# Patient Record
Sex: Female | Born: 1940 | Race: White | Hispanic: No | Smoking: Never smoker
Health system: Southern US, Community
[De-identification: ages and names within clinical notes are randomized; demographics above are authoritative.]

## PROBLEM LIST (undated history)

## (undated) DIAGNOSIS — N132 Hydronephrosis with renal and ureteral calculous obstruction: Secondary | ICD-10-CM

## (undated) DIAGNOSIS — F32A Depression, unspecified: Secondary | ICD-10-CM

## (undated) DIAGNOSIS — E785 Hyperlipidemia, unspecified: Secondary | ICD-10-CM

## (undated) DIAGNOSIS — I4891 Unspecified atrial fibrillation: Secondary | ICD-10-CM

## (undated) DIAGNOSIS — IMO0001 Reserved for inherently not codable concepts without codable children: Secondary | ICD-10-CM

## (undated) DIAGNOSIS — I639 Cerebral infarction, unspecified: Secondary | ICD-10-CM

## (undated) DIAGNOSIS — N3941 Urge incontinence: Secondary | ICD-10-CM

## (undated) DIAGNOSIS — I10 Essential (primary) hypertension: Secondary | ICD-10-CM

## (undated) DIAGNOSIS — R159 Full incontinence of feces: Secondary | ICD-10-CM

## (undated) DIAGNOSIS — I4892 Unspecified atrial flutter: Secondary | ICD-10-CM

## (undated) DIAGNOSIS — N200 Calculus of kidney: Secondary | ICD-10-CM

## (undated) DIAGNOSIS — N189 Chronic kidney disease, unspecified: Secondary | ICD-10-CM

## (undated) DIAGNOSIS — F329 Major depressive disorder, single episode, unspecified: Secondary | ICD-10-CM

## (undated) DIAGNOSIS — N12 Tubulo-interstitial nephritis, not specified as acute or chronic: Secondary | ICD-10-CM

## (undated) DIAGNOSIS — R011 Cardiac murmur, unspecified: Secondary | ICD-10-CM

## (undated) HISTORY — DX: Hyperlipidemia, unspecified: E78.5

## (undated) HISTORY — PX: KNEE SURGERY: SHX244

## (undated) HISTORY — DX: Reserved for inherently not codable concepts without codable children: IMO0001

## (undated) HISTORY — PX: ABDOMINAL HYSTERECTOMY: SHX81

---

## 1992-05-12 HISTORY — PX: BLADDER SURGERY: SHX569

## 1997-10-11 ENCOUNTER — Emergency Department (HOSPITAL_COMMUNITY): Admission: EM | Admit: 1997-10-11 | Discharge: 1997-10-11 | Payer: Self-pay | Admitting: Emergency Medicine

## 1997-10-25 ENCOUNTER — Encounter: Admission: RE | Admit: 1997-10-25 | Discharge: 1998-01-23 | Payer: Self-pay | Admitting: Specialist

## 1998-04-15 ENCOUNTER — Emergency Department (HOSPITAL_COMMUNITY): Admission: EM | Admit: 1998-04-15 | Discharge: 1998-04-15 | Payer: Self-pay | Admitting: Internal Medicine

## 1999-11-05 ENCOUNTER — Emergency Department (HOSPITAL_COMMUNITY): Admission: EM | Admit: 1999-11-05 | Discharge: 1999-11-05 | Payer: Self-pay | Admitting: Emergency Medicine

## 2000-10-24 ENCOUNTER — Emergency Department (HOSPITAL_COMMUNITY): Admission: EM | Admit: 2000-10-24 | Discharge: 2000-10-24 | Payer: Self-pay

## 2001-08-10 ENCOUNTER — Ambulatory Visit (HOSPITAL_COMMUNITY): Admission: RE | Admit: 2001-08-10 | Discharge: 2001-08-10 | Payer: Self-pay | Admitting: Family Medicine

## 2002-10-16 ENCOUNTER — Emergency Department (HOSPITAL_COMMUNITY): Admission: EM | Admit: 2002-10-16 | Discharge: 2002-10-16 | Payer: Self-pay | Admitting: Emergency Medicine

## 2003-07-14 ENCOUNTER — Emergency Department (HOSPITAL_COMMUNITY): Admission: EM | Admit: 2003-07-14 | Discharge: 2003-07-15 | Payer: Self-pay | Admitting: *Deleted

## 2003-09-20 ENCOUNTER — Emergency Department (HOSPITAL_COMMUNITY): Admission: EM | Admit: 2003-09-20 | Discharge: 2003-09-20 | Payer: Self-pay | Admitting: Emergency Medicine

## 2004-01-15 ENCOUNTER — Emergency Department (HOSPITAL_COMMUNITY): Admission: EM | Admit: 2004-01-15 | Discharge: 2004-01-15 | Payer: Self-pay | Admitting: *Deleted

## 2004-08-08 ENCOUNTER — Emergency Department (HOSPITAL_COMMUNITY): Admission: EM | Admit: 2004-08-08 | Discharge: 2004-08-09 | Payer: Self-pay | Admitting: Emergency Medicine

## 2005-05-12 DIAGNOSIS — IMO0001 Reserved for inherently not codable concepts without codable children: Secondary | ICD-10-CM

## 2005-05-12 HISTORY — DX: Reserved for inherently not codable concepts without codable children: IMO0001

## 2005-06-23 ENCOUNTER — Emergency Department (HOSPITAL_COMMUNITY): Admission: EM | Admit: 2005-06-23 | Discharge: 2005-06-23 | Payer: Self-pay | Admitting: Family Medicine

## 2005-12-21 ENCOUNTER — Emergency Department (HOSPITAL_COMMUNITY): Admission: EM | Admit: 2005-12-21 | Discharge: 2005-12-21 | Payer: Self-pay | Admitting: Family Medicine

## 2006-01-08 ENCOUNTER — Emergency Department (HOSPITAL_COMMUNITY): Admission: EM | Admit: 2006-01-08 | Discharge: 2006-01-08 | Payer: Self-pay | Admitting: Emergency Medicine

## 2006-05-03 ENCOUNTER — Emergency Department (HOSPITAL_COMMUNITY): Admission: EM | Admit: 2006-05-03 | Discharge: 2006-05-03 | Payer: Self-pay | Admitting: Emergency Medicine

## 2006-05-12 ENCOUNTER — Emergency Department (HOSPITAL_COMMUNITY): Admission: EM | Admit: 2006-05-12 | Discharge: 2006-05-12 | Payer: Self-pay | Admitting: Emergency Medicine

## 2006-08-22 ENCOUNTER — Emergency Department (HOSPITAL_COMMUNITY): Admission: EM | Admit: 2006-08-22 | Discharge: 2006-08-22 | Payer: Self-pay | Admitting: Family Medicine

## 2006-12-01 ENCOUNTER — Emergency Department (HOSPITAL_COMMUNITY): Admission: EM | Admit: 2006-12-01 | Discharge: 2006-12-01 | Payer: Self-pay | Admitting: Emergency Medicine

## 2007-02-11 ENCOUNTER — Ambulatory Visit: Payer: Self-pay | Admitting: Family Medicine

## 2007-02-11 DIAGNOSIS — E782 Mixed hyperlipidemia: Secondary | ICD-10-CM

## 2007-02-11 DIAGNOSIS — D6489 Other specified anemias: Secondary | ICD-10-CM | POA: Insufficient documentation

## 2007-02-11 DIAGNOSIS — I10 Essential (primary) hypertension: Secondary | ICD-10-CM | POA: Insufficient documentation

## 2007-02-11 DIAGNOSIS — M1990 Unspecified osteoarthritis, unspecified site: Secondary | ICD-10-CM | POA: Insufficient documentation

## 2007-02-11 DIAGNOSIS — E118 Type 2 diabetes mellitus with unspecified complications: Secondary | ICD-10-CM

## 2007-02-11 DIAGNOSIS — K219 Gastro-esophageal reflux disease without esophagitis: Secondary | ICD-10-CM | POA: Insufficient documentation

## 2007-02-11 DIAGNOSIS — E669 Obesity, unspecified: Secondary | ICD-10-CM

## 2007-02-11 LAB — CONVERTED CEMR LAB
AST: 10 units/L (ref 0–37)
Albumin: 4.1 g/dL (ref 3.5–5.2)
Alkaline Phosphatase: 73 units/L (ref 39–117)
BUN: 39 mg/dL — ABNORMAL HIGH (ref 6–23)
CO2: 21 meq/L (ref 19–32)
Creatinine, Ser: 1.44 mg/dL — ABNORMAL HIGH (ref 0.40–1.20)
HCT: 32.3 % — ABNORMAL LOW (ref 36.0–46.0)
Hgb A1c MFr Bld: 7.1 %
MCV: 85.7 fL (ref 78.0–100.0)
Platelets: 181 10*3/uL (ref 150–400)
Potassium: 4.5 meq/L (ref 3.5–5.3)
RBC: 3.77 M/uL — ABNORMAL LOW (ref 3.87–5.11)
Sodium: 142 meq/L (ref 135–145)
WBC: 6.3 10*3/uL (ref 4.0–10.5)

## 2007-02-12 ENCOUNTER — Encounter: Payer: Self-pay | Admitting: Family Medicine

## 2007-03-02 ENCOUNTER — Encounter: Payer: Self-pay | Admitting: Family Medicine

## 2007-03-12 ENCOUNTER — Encounter: Payer: Self-pay | Admitting: Family Medicine

## 2007-03-29 ENCOUNTER — Telehealth: Payer: Self-pay | Admitting: *Deleted

## 2007-05-17 ENCOUNTER — Emergency Department (HOSPITAL_COMMUNITY): Admission: EM | Admit: 2007-05-17 | Discharge: 2007-05-17 | Payer: Self-pay | Admitting: Family Medicine

## 2007-06-06 ENCOUNTER — Emergency Department (HOSPITAL_COMMUNITY): Admission: EM | Admit: 2007-06-06 | Discharge: 2007-06-06 | Payer: Self-pay | Admitting: Family Medicine

## 2007-06-17 ENCOUNTER — Ambulatory Visit: Payer: Self-pay | Admitting: Family Medicine

## 2007-06-17 LAB — CONVERTED CEMR LAB
HCT: 32.8 % — ABNORMAL LOW (ref 36.0–46.0)
MCV: 86.3 fL (ref 78.0–100.0)
RBC: 3.8 M/uL — ABNORMAL LOW (ref 3.87–5.11)
RDW: 13.8 % (ref 11.5–15.5)

## 2007-08-31 ENCOUNTER — Telehealth: Payer: Self-pay | Admitting: *Deleted

## 2007-09-01 ENCOUNTER — Ambulatory Visit: Payer: Self-pay | Admitting: Family Medicine

## 2007-09-01 ENCOUNTER — Encounter (INDEPENDENT_AMBULATORY_CARE_PROVIDER_SITE_OTHER): Payer: Self-pay | Admitting: Family Medicine

## 2007-09-01 LAB — CONVERTED CEMR LAB
Bilirubin Urine: NEGATIVE
Glucose, Urine, Semiquant: NEGATIVE
Protein, U semiquant: NEGATIVE
pH: 5.5

## 2007-11-09 ENCOUNTER — Telehealth: Payer: Self-pay | Admitting: *Deleted

## 2007-11-10 ENCOUNTER — Ambulatory Visit: Payer: Self-pay | Admitting: Family Medicine

## 2007-11-10 ENCOUNTER — Encounter: Payer: Self-pay | Admitting: Family Medicine

## 2007-11-10 LAB — CONVERTED CEMR LAB
Bacteria, UA: 3
Nitrite: POSITIVE
Protein, U semiquant: NEGATIVE
Specific Gravity, Urine: 1.015
Urobilinogen, UA: 0.2
pH: 5

## 2007-12-16 ENCOUNTER — Ambulatory Visit: Payer: Self-pay | Admitting: Family Medicine

## 2007-12-16 LAB — CONVERTED CEMR LAB
BUN: 43 mg/dL — ABNORMAL HIGH (ref 6–23)
CO2: 21 meq/L (ref 19–32)
Calcium: 8.8 mg/dL (ref 8.4–10.5)
Cholesterol: 145 mg/dL (ref 0–200)
HCT: 32.1 % — ABNORMAL LOW (ref 36.0–46.0)
Hemoglobin: 10.4 g/dL — ABNORMAL LOW (ref 12.0–15.0)
Hgb A1c MFr Bld: 7 %
LDL Cholesterol: 83 mg/dL (ref 0–99)
MCHC: 32.4 g/dL (ref 30.0–36.0)
Platelets: 205 10*3/uL (ref 150–400)
Potassium: 4.2 meq/L (ref 3.5–5.3)
RBC: 3.77 M/uL — ABNORMAL LOW (ref 3.87–5.11)
RDW: 14.4 % (ref 11.5–15.5)
Triglycerides: 157 mg/dL — ABNORMAL HIGH (ref <150)
WBC: 7.4 10*3/uL (ref 4.0–10.5)

## 2008-02-25 ENCOUNTER — Ambulatory Visit: Payer: Self-pay | Admitting: Family Medicine

## 2008-03-29 ENCOUNTER — Emergency Department (HOSPITAL_COMMUNITY): Admission: EM | Admit: 2008-03-29 | Discharge: 2008-03-29 | Payer: Self-pay | Admitting: Emergency Medicine

## 2008-06-07 ENCOUNTER — Ambulatory Visit: Payer: Self-pay | Admitting: Family Medicine

## 2008-06-07 LAB — CONVERTED CEMR LAB
Bilirubin Urine: NEGATIVE
Glucose, Urine, Semiquant: NEGATIVE
Ketones, urine, test strip: NEGATIVE
Specific Gravity, Urine: 1.02
WBC, UA: >20 cells/hpf
pH: 5.5

## 2008-06-08 ENCOUNTER — Encounter: Payer: Self-pay | Admitting: Family Medicine

## 2008-07-10 ENCOUNTER — Ambulatory Visit: Payer: Self-pay | Admitting: Family Medicine

## 2008-07-10 LAB — CONVERTED CEMR LAB
ALT: 9 units/L (ref 0–35)
BUN: 44 mg/dL — ABNORMAL HIGH (ref 6–23)
CO2: 18 meq/L — ABNORMAL LOW (ref 19–32)
Calcium: 8.7 mg/dL (ref 8.4–10.5)
Chloride: 111 meq/L (ref 96–112)
Creatinine, Ser: 1.75 mg/dL — ABNORMAL HIGH (ref 0.40–1.20)
Glucose, Bld: 65 mg/dL — ABNORMAL LOW (ref 70–99)
Hgb A1c MFr Bld: 6.4 %

## 2008-07-24 ENCOUNTER — Encounter: Payer: Self-pay | Admitting: *Deleted

## 2008-08-21 ENCOUNTER — Telehealth: Payer: Self-pay | Admitting: Family Medicine

## 2008-09-26 ENCOUNTER — Encounter: Payer: Self-pay | Admitting: Family Medicine

## 2008-11-17 ENCOUNTER — Encounter: Payer: Self-pay | Admitting: Family Medicine

## 2008-11-17 ENCOUNTER — Ambulatory Visit: Payer: Self-pay | Admitting: Family Medicine

## 2008-11-17 LAB — CONVERTED CEMR LAB
Bilirubin Urine: NEGATIVE
Urobilinogen, UA: 0.2

## 2008-12-04 ENCOUNTER — Ambulatory Visit: Payer: Self-pay | Admitting: Family Medicine

## 2008-12-13 ENCOUNTER — Telehealth: Payer: Self-pay | Admitting: *Deleted

## 2008-12-13 ENCOUNTER — Encounter: Payer: Self-pay | Admitting: Family Medicine

## 2008-12-13 ENCOUNTER — Ambulatory Visit: Payer: Self-pay | Admitting: Family Medicine

## 2008-12-13 LAB — CONVERTED CEMR LAB
Bilirubin Urine: NEGATIVE
Ketones, urine, test strip: NEGATIVE
Protein, U semiquant: 30
RBC / HPF: >20
Urobilinogen, UA: 1

## 2008-12-14 ENCOUNTER — Encounter: Payer: Self-pay | Admitting: Family Medicine

## 2009-02-26 ENCOUNTER — Telehealth: Payer: Self-pay | Admitting: Family Medicine

## 2009-02-26 ENCOUNTER — Ambulatory Visit: Payer: Self-pay | Admitting: Family Medicine

## 2009-02-26 DIAGNOSIS — G4700 Insomnia, unspecified: Secondary | ICD-10-CM

## 2009-02-26 LAB — CONVERTED CEMR LAB
Nitrite: NEGATIVE
Specific Gravity, Urine: 1.015

## 2009-04-02 ENCOUNTER — Ambulatory Visit: Payer: Self-pay | Admitting: Family Medicine

## 2009-04-02 LAB — CONVERTED CEMR LAB
Bilirubin Urine: NEGATIVE
Blood in Urine, dipstick: NEGATIVE
Glucose, Urine, Semiquant: NEGATIVE
Protein, U semiquant: NEGATIVE
Specific Gravity, Urine: 1.02
pH: 5.5

## 2009-04-03 ENCOUNTER — Encounter: Payer: Self-pay | Admitting: Family Medicine

## 2009-04-10 ENCOUNTER — Observation Stay (HOSPITAL_COMMUNITY): Admission: EM | Admit: 2009-04-10 | Discharge: 2009-04-11 | Payer: Self-pay | Admitting: Emergency Medicine

## 2009-04-19 ENCOUNTER — Ambulatory Visit: Payer: Self-pay | Admitting: Family Medicine

## 2009-04-19 ENCOUNTER — Encounter: Payer: Self-pay | Admitting: Family Medicine

## 2009-04-19 LAB — CONVERTED CEMR LAB
Hemoglobin: 10.8 g/dL — ABNORMAL LOW (ref 12.0–15.0)
MCHC: 32.3 g/dL (ref 30.0–36.0)
MCV: 84.8 fL (ref 78.0–100.0)
RDW: 14 % (ref 11.5–15.5)

## 2009-04-23 ENCOUNTER — Ambulatory Visit: Payer: Self-pay | Admitting: Family Medicine

## 2009-05-09 ENCOUNTER — Ambulatory Visit: Payer: Self-pay | Admitting: Family Medicine

## 2009-07-30 ENCOUNTER — Ambulatory Visit: Payer: Self-pay | Admitting: Family Medicine

## 2009-07-30 DIAGNOSIS — C4490 Unspecified malignant neoplasm of skin, unspecified: Secondary | ICD-10-CM

## 2009-07-30 LAB — CONVERTED CEMR LAB
Blood in Urine, dipstick: NEGATIVE
Hgb A1c MFr Bld: 8.1 %
Nitrite: NEGATIVE
Specific Gravity, Urine: 1.015
Urobilinogen, UA: 1

## 2009-08-15 ENCOUNTER — Encounter: Admission: RE | Admit: 2009-08-15 | Discharge: 2009-10-03 | Payer: Self-pay | Admitting: Orthopaedic Surgery

## 2009-08-27 ENCOUNTER — Encounter: Payer: Self-pay | Admitting: Family Medicine

## 2009-09-07 ENCOUNTER — Telehealth: Payer: Self-pay | Admitting: Family Medicine

## 2009-09-07 ENCOUNTER — Ambulatory Visit: Payer: Self-pay | Admitting: Family Medicine

## 2009-09-17 ENCOUNTER — Ambulatory Visit: Payer: Self-pay | Admitting: Family Medicine

## 2009-09-17 LAB — CONVERTED CEMR LAB
Bilirubin Urine: NEGATIVE
Ketones, urine, test strip: NEGATIVE
Nitrite: NEGATIVE
Protein, U semiquant: NEGATIVE
Urobilinogen, UA: 0.2

## 2009-10-10 ENCOUNTER — Encounter: Payer: Self-pay | Admitting: Family Medicine

## 2009-12-26 ENCOUNTER — Telehealth: Payer: Self-pay | Admitting: Family Medicine

## 2010-01-07 ENCOUNTER — Ambulatory Visit: Payer: Self-pay | Admitting: Family Medicine

## 2010-01-07 LAB — CONVERTED CEMR LAB: Hgb A1c MFr Bld: 7.5 %

## 2010-01-10 ENCOUNTER — Encounter: Payer: Self-pay | Admitting: Family Medicine

## 2010-01-10 ENCOUNTER — Ambulatory Visit: Payer: Self-pay | Admitting: Family Medicine

## 2010-01-11 ENCOUNTER — Telehealth: Payer: Self-pay | Admitting: *Deleted

## 2010-01-11 DIAGNOSIS — N183 Chronic kidney disease, stage 3 unspecified: Secondary | ICD-10-CM | POA: Insufficient documentation

## 2010-01-17 ENCOUNTER — Encounter: Payer: Self-pay | Admitting: Family Medicine

## 2010-01-17 ENCOUNTER — Emergency Department (HOSPITAL_COMMUNITY): Admission: EM | Admit: 2010-01-17 | Discharge: 2010-01-17 | Payer: Self-pay | Admitting: Emergency Medicine

## 2010-01-17 ENCOUNTER — Ambulatory Visit (HOSPITAL_COMMUNITY): Admission: RE | Admit: 2010-01-17 | Discharge: 2010-01-17 | Payer: Self-pay | Admitting: Family Medicine

## 2010-01-18 LAB — CONVERTED CEMR LAB
ALT: 8 units/L (ref 0–35)
Albumin: 4 g/dL (ref 3.5–5.2)
CO2: 22 meq/L (ref 19–32)
Calcium: 8.1 mg/dL — ABNORMAL LOW (ref 8.4–10.5)
Chloride: 108 meq/L (ref 96–112)
Cholesterol: 149 mg/dL (ref 0–200)
HCT: 32.3 % — ABNORMAL LOW (ref 36.0–46.0)
Platelets: 174 10*3/uL (ref 150–400)
Potassium: 4.5 meq/L (ref 3.5–5.3)
Sodium: 143 meq/L (ref 135–145)
Total Protein: 6.3 g/dL (ref 6.0–8.3)
WBC: 6.8 10*3/uL (ref 4.0–10.5)

## 2010-02-18 ENCOUNTER — Encounter: Payer: Self-pay | Admitting: Family Medicine

## 2010-03-06 ENCOUNTER — Ambulatory Visit: Payer: Self-pay | Admitting: Family Medicine

## 2010-03-06 DIAGNOSIS — E1149 Type 2 diabetes mellitus with other diabetic neurological complication: Secondary | ICD-10-CM | POA: Insufficient documentation

## 2010-03-06 LAB — CONVERTED CEMR LAB
Bilirubin Urine: NEGATIVE
Epithelial cells, urine: <5 /lpf
Ketones, urine, test strip: NEGATIVE
Protein, U semiquant: NEGATIVE
Urobilinogen, UA: 0.2
pH: 5.5

## 2010-03-13 ENCOUNTER — Encounter: Payer: Self-pay | Admitting: Family Medicine

## 2010-03-21 ENCOUNTER — Ambulatory Visit: Payer: Self-pay | Admitting: Family Medicine

## 2010-03-21 LAB — CONVERTED CEMR LAB
Blood in Urine, dipstick: NEGATIVE
Glucose, Urine, Semiquant: NEGATIVE
Ketones, urine, test strip: NEGATIVE
Protein, U semiquant: NEGATIVE
pH: 5.5

## 2010-03-22 ENCOUNTER — Encounter: Payer: Self-pay | Admitting: Family Medicine

## 2010-05-17 ENCOUNTER — Encounter: Payer: Self-pay | Admitting: *Deleted

## 2010-06-03 ENCOUNTER — Ambulatory Visit
Admission: RE | Admit: 2010-06-03 | Discharge: 2010-06-03 | Payer: Self-pay | Source: Home / Self Care | Attending: Family Medicine | Admitting: Family Medicine

## 2010-06-13 NOTE — Miscellaneous (Signed)
Summary: Lyrica refussal  Clinical Lists Changes  Needs to have tried gabapentin first.  Called her NA left message Pearlean Brownie MD  March 13, 2010 10:23 AM   Medications: Changed medication from LYRICA 50 MG CAPS (PREGABALIN) 1 tablet three times a day to GABAPENTIN 100 MG CAPS (GABAPENTIN) 1 by mouth once daily build up till are taking 1 three times a day for pain - Signed Rx of GABAPENTIN 100 MG CAPS (GABAPENTIN) 1 by mouth once daily build up till are taking 1 three times a day for pain;  #90 x 1;  Signed;  Entered by: Pearlean Brownie MD;  Authorized by: Pearlean Brownie MD;  Method used: Electronically to [facility]*, [address], [place], [place]  [address], Ph: [phone], Fax: [phone]    Prescriptions: GABAPENTIN 100 MG CAPS (GABAPENTIN) 1 by mouth once daily build up till are taking 1 three times a day for pain  #90 x 1   Entered and Authorized by:   Pearlean Brownie MD   Signed by:   Pearlean Brownie MD on 03/13/2010   Method used:   Electronically to        [facility]* (retail)       [address]       [place], [place]  [address]       Ph: [phone]       Fax: [phone]   RxID:   [id number]

## 2010-06-13 NOTE — Progress Notes (Signed)
Summary: refill  Phone Note Refill Request Call back at Home Phone [phone] Message from:  Patient  Refills Requested: Medication #1:  CLONIDINE HCL 0.1 MG TABS one two times a day for one week then stop  Medication #2:  ATENOLOL 25 MG TABS 1 by mouth two times a day  Medication #3:  ENALAPRIL MALEATE 20 MG  TABS 1 two times a day  Medication #4:  HYDROCHLOROTHIAZIDE 50 MG  TABS 1 daily Glimepiride, Metformin, Nexium, Simvastatin pt states that she needs all of her meds to go to [facility] - [phone]  before her appt on 8/29  Initial call taken by: De Nurse,  December 26, 2009 2:47 PM  Follow-up for Phone Call        Can only do one month to [facility] until she is seen. Not sure they will fill that small an amount.  Alternatively can send to local pharmacy Ask which she would like to do thanks  Follow-up by: Pearlean Brownie MD,  December 27, 2009 9:49 AM  Additional Follow-up for Phone Call Additional follow up Details #1::        Filled out form from [facility] for 30 day supply of all medications on her list Additional Follow-up by: Pearlean Brownie MD,  December 28, 2009 4:31 PM    Additional Follow-up for Phone Call Additional follow up Details #2::    Pt informed Follow-up by: Jone Baseman CMA,  December 28, 2009 5:20 PM

## 2010-06-13 NOTE — Progress Notes (Signed)
Summary: triage  Phone Note Call from Patient Call back at Home Phone [phone]   Caller: Patient Complaint: Nausea/Vomiting/Diarrhea Summary of Call: wants to come in today Initial call taken by: De Nurse,  September 07, 2009 10:11 AM  Follow-up for Phone Call        styates this has been going on for 2 days. difficulty keeping ginger ale down. it also triggers more diarrhea. adamant she cannot come this aM. TOLD HER TO TAKE HER CBG & TRY TO DRINK SMALL AMOUNTS OF GINGER ALE at a time. if feeling worse call back.  appt at 3pm work in Follow-up by: Golden Circle RN,  September 07, 2009 10:17 AM

## 2010-06-13 NOTE — Assessment & Plan Note (Signed)
Summary: f/u,df   Vital Signs:  Patient profile:   70 year old female Height:      65 inches Weight:      250.06 pounds BMI:     41.76 BSA:     2.18 Temp:     98.0 degrees F Pulse rate:   82 / minute BP sitting:   138 / 80  Vitals Entered By: Jone Baseman CMA (March 06, 2010 10:25 AM) CC: f/u Is Patient Diabetic? Yes Did you bring your meter with you today? No Pain Assessment Patient in pain? yes     Location: lower stomach Intensity: 5   Primary Care Provider:  Pearlean Brownie MD  CC:  f/u.  History of Present Illness: Dysuria for the last few days.  No fever or back pain or nausea or vomiting.  Is convinced her glimiperide is causing frequent UTIs. Her symptoms all went away after her last antibiotics  Neuropathy Has pain - tingling, burning in lower extremities waxes and wanes.  Would like to try Lyrica.  Tramadol does not help.  Has a callous on her L foot but no open sores.  Wears diabetes shoes.    Renal Failure no edema or shortness of breath.  Relates is taking her diabetes and hypertension medications regularly.  ROS - as above PMH - Medications reviewed and updated in medication list.  Smoking Status noted in VS form      Habits & Providers  Alcohol-Tobacco-Diet     Tobacco Status: never     Tobacco Counseling: not indicated; no tobacco use  Current Medications (verified): 1)  Metformin Hcl 1000 Mg Tabs (Metformin Hcl) .Marland Kitchen.. 1 Two Times A Day 2)  Hydrochlorothiazide 50 Mg  Tabs (Hydrochlorothiazide) .Marland Kitchen.. 1 Daily 3)  Atenolol 25 Mg Tabs (Atenolol) .Marland Kitchen.. 1 By Mouth Two Times A Day 4)  Enalapril Maleate 20 Mg  Tabs (Enalapril Maleate) .Marland Kitchen.. 1 Two Times A Day 5)  Glipizide 10 Mg Tabs (Glipizide) .Marland Kitchen.. 1 Daily For Diabetes 6)  Nexium 40 Mg Cpdr (Esomeprazole Magnesium) .Marland Kitchen.. 1 Daily 7)  Clonidine Hcl 0.1 Mg Tabs (Clonidine Hcl) .... One Two Times A Day 8)  Simvastatin 40 Mg Tabs (Simvastatin) .... Take One Tablet At Bedtime 9)  Ascensia Contour  Test   Strp (Glucose Blood) .... As Directed 1 Box 10)  Ferrous Sulfate 325 (65 Fe) Mg  Tbec (Ferrous Sulfate) .... Hold Until Cbc Back 11)  Ondansetron Hcl 4 Mg Tabs (Ondansetron Hcl) .Marland Kitchen.. 1 Tab By Mouth Q 6 Hours As Needed Nausea or Vomitting 12)  Tramadol Hcl 50 Mg Tabs (Tramadol Hcl) .... Take 1-2 Tabs By Mouth Every 6 Hours As Needed For Pain 13)  Keflex 500 Mg Caps (Cephalexin) .Marland Kitchen.. 1 By Mouth Two Times A Day For 7 Days 14)  Lyrica 50 Mg Caps (Pregabalin) .Marland Kitchen.. 1 Tablet Three Times A Day  Allergies: No Known Drug Allergies  Physical Exam  General:  Well-developed,well-nourished,in no acute distress; alert,appropriate and cooperative throughout examination Msk:  No costrovertebral angle tenderness tenderness Extremities:  No edema  Diabetes Management Exam:    Foot Exam (with socks and/or shoes not present):       Sensory-Pinprick/Light touch:          Left medial foot (L-4): normal          Left dorsal foot (L-5): normal          Left lateral foot (S-1): normal          Right medial foot (L-4):  normal          Right dorsal foot (L-5): normal          Right lateral foot (S-1): normal       Inspection:          Left foot: abnormal             Comments: callus in mid ball of L foot has a ring pad on. No skin breakdown           Right foot: normal   Impression & Recommendations:  Problem # 1:  DIABETIC PERIPHERAL NEUROPATHY (ICD-250.60)  Will start lyrica since tramadol did not help.  Given callus on her foot will write for diabetes shoes  Her updated medication list for this problem includes:    Metformin Hcl 1000 Mg Tabs (Metformin hcl) .Marland Kitchen... 1 two times a day    Enalapril Maleate 20 Mg Tabs (Enalapril maleate) .Marland Kitchen... 1 two times a day    Glipizide 10 Mg Tabs (Glipizide) .Marland Kitchen... 1 daily for diabetes  Orders: [facility]- Est  Level 4 ([address])  Problem # 2:  DYSURIA (ICD-788.1) recurrent UTI.  Will treat and then get a post treatment culture to document cure.  May need suppressive  therapy.  Unfortunately her urine from today was discarded before could be sent for culture   Her updated medication list for this problem includes:    Keflex 500 Mg Caps (Cephalexin) .Marland Kitchen... 1 by mouth two times a day for 7 days  Orders: Urinalysis-[facility] (00000) [facility]- Est  Level 4 ([address])  Problem # 3:  ESSENTIAL HYPERTENSION (ICD-401.9) On recheck was 130/80.  Will continue tight control given her renal disease  Her updated medication list for this problem includes:    Hydrochlorothiazide 50 Mg Tabs (Hydrochlorothiazide) .Marland Kitchen... 1 daily    Atenolol 25 Mg Tabs (Atenolol) .Marland Kitchen... 1 by mouth two times a day    Enalapril Maleate 20 Mg Tabs (Enalapril maleate) .Marland Kitchen... 1 two times a day    Clonidine Hcl 0.1 Mg Tabs (Clonidine hcl) ..... One two times a day  Problem # 4:  CHRONIC KIDNEY DISEASE STAGE III (MODERATE) (ICD-585.3)  Will attempt tight hypertension and diabetes controll and treat and prevent UTIs.  Her weight is worsening this condition and we wil continue to encourage her to change her behaviours   Orders: [facility]- Est  Level 4 ([address])  Complete Medication List: 1)  Metformin Hcl 1000 Mg Tabs (Metformin hcl) .Marland Kitchen.. 1 two times a day 2)  Hydrochlorothiazide 50 Mg Tabs (Hydrochlorothiazide) .Marland Kitchen.. 1 daily 3)  Atenolol 25 Mg Tabs (Atenolol) .Marland Kitchen.. 1 by mouth two times a day 4)  Enalapril Maleate 20 Mg Tabs (Enalapril maleate) .Marland Kitchen.. 1 two times a day 5)  Glipizide 10 Mg Tabs (Glipizide) .Marland Kitchen.. 1 daily for diabetes 6)  Nexium 40 Mg Cpdr (Esomeprazole magnesium) .Marland Kitchen.. 1 daily 7)  Clonidine Hcl 0.1 Mg Tabs (Clonidine hcl) .... One two times a day 8)  Simvastatin 40 Mg Tabs (Simvastatin) .... Take one tablet at bedtime 9)  Ascensia Contour Test Strp (Glucose blood) .... As directed 1 box 10)  Ferrous Sulfate 325 (65 Fe) Mg Tbec (Ferrous sulfate) .... Hold until cbc back 11)  Ondansetron Hcl 4 Mg Tabs (Ondansetron hcl) .Marland Kitchen.. 1 tab by mouth q 6 hours as needed nausea or vomitting 12)  Tramadol Hcl 50 Mg Tabs  (Tramadol hcl) .... Take 1-2 tabs by mouth every 6 hours as needed for pain 13)  Keflex 500 Mg Caps (Cephalexin) .Marland KitchenMarland KitchenMarland Kitchen 1  by mouth two times a day for 7 days 14)  Lyrica 50 Mg Caps (Pregabalin) .Marland Kitchen.. 1 tablet three times a day  Other Orders: Influenza Vaccine MCR ([address])  Patient Instructions: 1)  Come back in December 2)  Start the Lyrica 1 tablet a day after 3-4 days increase to 2 tablets a day then after 3-4 days take 3 tablets a day 3)  Write down your blood pressure check it every other day 4)  Bring in your blood pressure machine next visit 5)  Come back in 2 weeks (call the day before) and give [place] a urine sample  6)  Take all the antibiotics for your bladder infection.  If you get high fever or back pain call us Prescriptions: LYRICA 50 MG CAPS (PREGABALIN) 1 tablet three times a day  #90 x 1   Entered and Authorized by:   Pearlean Brownie MD   Signed by:   Pearlean Brownie MD on 03/06/2010   Method used:   Handwritten   RxID:   [id number] GLIPIZIDE 10 MG TABS (GLIPIZIDE) 1 daily for diabetes  #30 x 3   Entered and Authorized by:   Pearlean Brownie MD   Signed by:   Pearlean Brownie MD on 03/06/2010   Method used:   Electronically to        [facility]* (retail)       [address]       [place], [place]  [address]       Ph: [phone]       Fax: [phone]   RxID:   [id number] KEFLEX 500 MG CAPS (CEPHALEXIN) 1 by mouth two times a day for 7 days  #14 x 0   Entered and Authorized by:   Pearlean Brownie MD   Signed by:   Pearlean Brownie MD on 03/06/2010   Method used:   Electronically to        [facility]* (retail)       [address]       [place], [place]  [address]       Ph: [phone]       Fax: [phone]   RxID:   [id number]    Orders Added: 1)  Urinalysis-[facility] [00000] 2)  Influenza Vaccine MCR [00025] 3)  [facility]- Est  Level 4 [[address]]   Immunizations Administered:  Influenza Vaccine # 1:    Vaccine Type: Fluvax  MCR    Site: right deltoid    Mfr: GlaxoSmithKline    Dose: 0.5 ml    Route: IM    Given by: Jone Baseman CMA    Exp. Date: 11/06/2010    Lot #: [id number]    VIS given: 12/04/09 version given March 06, 2010.  Flu Vaccine Consent Questions:    Do you have a history of severe allergic reactions to this vaccine? no    Any prior history of allergic reactions to egg and/or gelatin? no    Do you have a sensitivity to the preservative Thimersol? no    Do you have a past history of Guillan-Barre Syndrome? no    Do you currently have an acute febrile illness? no    Have you ever had a severe reaction to latex? no    Vaccine information given and explained to patient? yes    Are you currently pregnant? no   Immunizations Administered:  Influenza Vaccine # 1:    Vaccine Type: Fluvax MCR    Site: right deltoid    Mfr: GlaxoSmithKline  Dose: 0.5 ml    Route: IM    Given by: Jone Baseman CMA    Exp. Date: 11/06/2010    Lot #: [id number]    VIS given: 12/04/09 version given March 06, 2010.  Laboratory Results   Urine Tests  Date/Time Received: March 06, 2010 10:46 AM  Date/Time Reported: March 06, 2010 11:03 AM   Routine Urinalysis   Color: yellow Appearance: Cloudy Glucose: negative   (Normal Range: Negative) Bilirubin: negative   (Normal Range: Negative) Ketone: negative   (Normal Range: Negative) Spec. Gravity: 1.025   (Normal Range: 1.003-1.035) Blood: small   (Normal Range: Negative) pH: 5.5   (Normal Range: 5.0-8.0) Protein: negative   (Normal Range: Negative) Urobilinogen: 0.2   (Normal Range: 0-1) Nitrite: negative   (Normal Range: Negative) Leukocyte Esterace: large   (Normal Range: Negative)  Urine Microscopic WBC/HPF: loaded with some clumps RBC/HPF: difficult to see through [facility] - ? 5-15 Bacteria/HPF: 2+ Epithelial/HPF: <5    Comments: ...............test performed by......Marland KitchenBonnie A. [place], MLS (ASCP)cm

## 2010-06-13 NOTE — Assessment & Plan Note (Signed)
Summary: routine visit/eo   Vital Signs:  Patient profile:   70 year old female Height:      65 inches Weight:      249 pounds BMI:     41.59 Temp:     97.7 degrees F oral Pulse rate:   80 / minute BP sitting:   140 / 74  (left arm) Cuff size:   large  Vitals Entered By: Garen Grams LPN (June 03, 2010 2:45 PM) CC: f/u meds, dm Is Patient Diabetic? Yes Did you bring your meter with you today? No Pain Assessment Patient in pain? yes     Location: left arm   Primary Care Provider:  Pearlean Brownie MD  CC:  f/u meds and dm.  History of Present Illness: HYPERTENSION Disease Monitoring Blood pressure range:got cuff but does not have batteries yet   Chest pain:N   Dyspnea:N Medications Compliance:not taking hctz for last week ran out of meds   Lightheadedness:N   Edema:N   DIABETES Disease Monitoring Blood Sugar ranges:not checking   Polyuria:N   Visual problems:N Medications Compliance:daily brought all meds   Hypoglycemic symptoms:N   HYPERLIPIDEMIA Disease Monitoring See symptoms for Hypertension Medications Compliance:daily simvastatin   RUQ pain:N   Muscle aches:N  ROS - as above PMH - Medications reviewed and updated in medication list.  Smoking Status noted in VS form     Habits & Providers  Alcohol-Tobacco-Diet     Tobacco Status: never     Tobacco Counseling: not indicated; no tobacco use  Current Medications (verified): 1)  Metformin Hcl 1000 Mg Tabs (Metformin Hcl) .Marland Kitchen.. 1 Two Times A Day 2)  Hydrochlorothiazide 50 Mg  Tabs (Hydrochlorothiazide) .Marland Kitchen.. 1 Daily 3)  Atenolol 25 Mg Tabs (Atenolol) .Marland Kitchen.. 1 By Mouth Two Times A Day 4)  Enalapril Maleate 20 Mg  Tabs (Enalapril Maleate) .Marland Kitchen.. 1 Two Times A Day 5)  Glimepiride 2 Mg Tabs (Glimepiride) .Marland Kitchen.. 1 Daily For Diabetes 6)  Nexium 40 Mg Cpdr (Esomeprazole Magnesium) .Marland Kitchen.. 1 Daily 7)  Clonidine Hcl 0.1 Mg Tabs (Clonidine Hcl) .... One Two Times A Day 8)  Simvastatin 40 Mg Tabs (Simvastatin) ....  Take One Tablet At Bedtime 9)  Ascensia Contour Test   Strp (Glucose Blood) .... As Directed 1 Box 10)  Ferrous Sulfate 325 (65 Fe) Mg  Tbec (Ferrous Sulfate) .... Hold Until Cbc Back 11)  Ondansetron Hcl 4 Mg Tabs (Ondansetron Hcl) .Marland Kitchen.. 1 Tab By Mouth Q 6 Hours As Needed Nausea or Vomitting 12)  Gabapentin 100 Mg Caps (Gabapentin) .Marland Kitchen.. 1 By Mouth Once Daily Build Up Till Are Taking 1 Three Times A Day For Pain  Allergies: No Known Drug Allergies  Physical Exam  General:  Well-developed,well-nourished,in no acute distress; alert,appropriate and cooperative throughout examination Msk:  Left shoulder - FROM without pain.  Neck FROM distal strength and sensatino intact in left arm    Impression & Recommendations:  Problem # 1:  DIABETES-TYPE 2 (ICD-250.00)  Her updated medication list for this problem includes:    Metformin Hcl 1000 Mg Tabs (Metformin hcl) .Marland Kitchen... 1 two times a day    Enalapril Maleate 20 Mg Tabs (Enalapril maleate) .Marland Kitchen... 1 two times a day    Glimepiride 2 Mg Tabs (Glimepiride) .Marland Kitchen... 1 daily for diabetes  Orders: [facility]1C-[facility] (04540) [facility]- Est  Level 4 (98119)  Problem # 2:  OBESITY NOS (ICD-278.00) We discused her desire to lose weight and the importance weight loss will have with her kidney function.  To  start she agreed to start moving more by doing chair exercises once a day while watching tv.  She agreed to do arm lifts and leg lifts 10 times for 3 reps.   Will call and follow up with her in 2 weeks.  Tina [name]  Problem # 3:  ESSENTIAL HYPERTENSION (ICD-401.9)  Not well controlled today likely due to lack of hctz Will get back on and monitor Her updated medication list for this problem includes:    Hydrochlorothiazide 50 Mg Tabs (Hydrochlorothiazide) .Marland Kitchen... 1 daily    Atenolol 25 Mg Tabs (Atenolol) .Marland Kitchen... 1 by mouth two times a day    Enalapril Maleate 20 Mg Tabs (Enalapril maleate) .Marland Kitchen... 1 two times a day    Clonidine Hcl 0.1 Mg Tabs (Clonidine hcl) ..... One  two times a day  Orders: [facility]- Est  Level 4 (56433)  Problem # 4:  HYPERLIPIDEMIA (ICD-272.2)  good controll Her updated medication list for this problem includes:    Simvastatin 40 Mg Tabs (Simvastatin) .Marland Kitchen... Take one tablet at bedtime  Orders: [facility]- Est  Level 4 (99214)  Problem # 5:  CHRONIC KIDNEY DISEASE STAGE III (MODERATE) (ICD-585.3)  will need to to controll all her risk factors as best can.  Check blood tests next visit.  May need to reduce metformin if her renal function is worsening.   Hopefully weight loss will help   Orders: [facility]- Est  Level 4 (29518)  Complete Medication List: 1)  Metformin Hcl 1000 Mg Tabs (Metformin hcl) .Marland Kitchen.. 1 two times a day 2)  Hydrochlorothiazide 50 Mg Tabs (Hydrochlorothiazide) .Marland Kitchen.. 1 daily 3)  Atenolol 25 Mg Tabs (Atenolol) .Marland Kitchen.. 1 by mouth two times a day 4)  Enalapril Maleate 20 Mg Tabs (Enalapril maleate) .Marland Kitchen.. 1 two times a day 5)  Glimepiride 2 Mg Tabs (Glimepiride) .Marland Kitchen.. 1 daily for diabetes 6)  Nexium 40 Mg Cpdr (Esomeprazole magnesium) .Marland Kitchen.. 1 daily 7)  Clonidine Hcl 0.1 Mg Tabs (Clonidine hcl) .... One two times a day 8)  Simvastatin 40 Mg Tabs (Simvastatin) .... Take one tablet at bedtime 9)  Ascensia Contour Test Strp (Glucose blood) .... As directed 1 box 10)  Ferrous Sulfate 325 (65 Fe) Mg Tbec (Ferrous sulfate) .... Hold until cbc back 11)  Ondansetron Hcl 4 Mg Tabs (Ondansetron hcl) .Marland Kitchen.. 1 tab by mouth q 6 hours as needed nausea or vomitting 12)  Gabapentin 100 Mg Caps (Gabapentin) .Marland Kitchen.. 1 by mouth once daily build up till are taking 1 three times a day for pain  Patient Instructions: 1)  Please schedule a follow-up appointment in 1 month.  2)  Take your blood pressure regularly if moslty > 140/90 call us  3)  Bring in your readings and blood pressure machine next visit 4)  Call about a Shingles (Zosatavax shot) 5)  See your Eye Doctor 6)  You need a colonoscopy to see if you have cancer 7)  Lift two cans of soup 10 times while  watching TV do this 3 times a day. 8)  Lift each leg 10 times while watching TV do this 3 times a day. Prescriptions: ONDANSETRON HCL 4 MG TABS (ONDANSETRON HCL) 1 tab by mouth q 6 hours as needed nausea or vomitting  #30 x 1   Entered and Authorized by:   Pearlean Brownie MD   Signed by:   Pearlean Brownie MD on 06/03/2010   Method used:   Electronically to        [facility]* (retail)  [address]       [place], [place]  [address]       Ph: [phone]       Fax: [phone]   RxID:   [id number] NEXIUM 40 MG CPDR (ESOMEPRAZOLE MAGNESIUM) 1 daily  #90 x 3   Entered and Authorized by:   Pearlean Brownie MD   Signed by:   Pearlean Brownie MD on 06/03/2010   Method used:   Electronically to        [facility]* (retail)       [address]       [place], [place]  [address]       Ph: [phone]       Fax: [phone]   RxID:   [id number] SIMVASTATIN 40 MG TABS (SIMVASTATIN) Take one tablet at bedtime  #90 x 3   Entered and Authorized by:   Pearlean Brownie MD   Signed by:   Pearlean Brownie MD on 06/03/2010   Method used:   Electronically to        [facility]* (retail)       [address]       [place], [place]  [address]       Ph: [phone]       Fax: [phone]   RxID:   [id number] ENALAPRIL MALEATE 20 MG  TABS (ENALAPRIL MALEATE) 1 two times a day  #180 x 0   Entered and Authorized by:   Pearlean Brownie MD   Signed by:   Pearlean Brownie MD on 06/03/2010   Method used:   Electronically to        [facility]* (retail)       [address]       [place], [place]  [address]       Ph: [phone]       Fax: [phone]   RxID:   [id number] ATENOLOL 25 MG TABS (ATENOLOL) 1 by mouth two times a day  #180 x 0   Entered and Authorized by:   Pearlean Brownie MD   Signed by:   Pearlean Brownie MD on 06/03/2010   Method used:   Electronically to        [facility]* (retail)       [address]       [place], [place]  [address]       Ph: [phone]       Fax: [phone]   RxID:   [id number] METFORMIN HCL 1000 MG TABS (METFORMIN HCL) 1 two times a day  #180 x 0   Entered and Authorized by:   Pearlean Brownie MD   Signed by:   Pearlean Brownie MD on 06/03/2010   Method used:   Electronically to        [facility]* (retail)       [address]       [place], [place]  [address]       Ph: [phone]       Fax: [phone]   RxID:   [id number] GLIMEPIRIDE 2 MG TABS (GLIMEPIRIDE) 1 daily for diabetes  #90 x 3   Entered and Authorized by:   Pearlean Brownie MD   Signed by:   Pearlean Brownie MD on 06/03/2010   Method used:   Electronically to        [facility]* (retail)       [address]       [place], [place]  [address]       Ph: [phone]  Fax: [phone]   RxID:   [id number] HYDROCHLOROTHIAZIDE 50 MG  TABS (HYDROCHLOROTHIAZIDE) 1 daily  #90 x 0   Entered and Authorized by:   Pearlean Brownie MD   Signed by:   Pearlean Brownie MD on 06/03/2010   Method used:   Electronically to        [facility]* (retail)       [address]       [place], [place]  [address]       Ph: [phone]       Fax: [phone]   RxID:   [id number]    Orders Added: 1)  A1C-[facility] [83036] 2)  [facility]- Est  Level 4 [06237]    Laboratory Results   Blood Tests   Date/Time Received: June 03, 2010 2:35 PM  Date/Time Reported: June 03, 2010 2:53 PM   HGBA1C: 7.0%   (Normal Range: Non-Diabetic - 3-6%   Control Diabetic - 6-8%)  Comments: ...............test performed by......Marland KitchenBonnie A. [place], MLS (ASCP)cm      Prevention & Chronic Care Immunizations   Influenza vaccine: Fluvax MCR  (03/06/2010)   Influenza vaccine due: 02/24/2009    Tetanus booster: Not documented    Pneumococcal vaccine: Pneumovax  (04/02/2009)    H. zoster vaccine: Not documented  Colorectal Screening   Hemoccult: Not documented    Hemoccult due: Not Indicated    Colonoscopy: Not documented  Other Screening   Pap smear: Not documented    Mammogram: Not documented    DXA bone density scan: Not documented   Smoking status: never  (06/03/2010)  Diabetes Mellitus   HgbA1C: 7.0  (06/03/2010)   Hemoglobin A1C due: 09/14/2007    Eye exam: early retinophathy  (09/26/2008)   Eye exam due: 09/26/2009    Foot exam: yes  (03/06/2010)   High risk foot: Not documented   Foot care education: Not documented   Foot exam due: 12/15/2008    Urine microalbumin/creatinine ratio: Not documented   Urine microalbumin action/deferral: Not indicated    Diabetes flowsheet reviewed?: Yes   Progress toward A1C goal: At goal  Lipids   Total Cholesterol: 149  (01/10/2010)   LDL: 74  (01/10/2010)   LDL Direct: Not documented   HDL: 29  (01/10/2010)   Triglycerides: 231  (01/10/2010)    SGOT (AST): 11  (01/10/2010)   SGPT (ALT): 8  (01/10/2010)   Alkaline phosphatase: 60  (01/10/2010)   Total bilirubin: 0.7  (01/10/2010)    Lipid flowsheet reviewed?: Yes   Progress toward LDL goal: At goal  Hypertension   Last Blood Pressure: 140 / 74  (06/03/2010)   Serum creatinine: 1.97  (01/10/2010)   Serum potassium 4.5  (01/10/2010)    Hypertension flowsheet reviewed?: Yes   Progress toward BP goal: Deteriorated  Self-Management Support :   Personal Goals (by the next clinic visit) :     Personal A1C goal: 8  (04/02/2009)     Personal blood pressure goal: 140/90  (04/02/2009)     Personal LDL goal: 100  (04/02/2009)    Diabetes self-management support: Not documented    Diabetes self-management support not done because: Good outcomes  (04/02/2009)    Hypertension self-management support: Not documented    Hypertension self-management support not done because: Good outcomes  (04/23/2009)    Lipid self-management support: Not documented     Lipid self-management support not done because: Good outcomes  (04/23/2009)

## 2010-06-13 NOTE — Assessment & Plan Note (Signed)
Summary: Tina [name],df   Vital Signs:  Patient profile:   70 year old female Height:      65 inches Weight:      250.5 pounds BMI:     41.84 Temp:     97.9 degrees F oral Pulse rate:   82 / minute BP sitting:   148 / 84  (left arm) Cuff size:   large  Vitals Entered By: Garen Grams LPN (January 07, 2010 2:27 PM) CC: Tina [name] Is Patient Diabetic? Yes Did you bring your meter with you today? No Pain Assessment Patient in pain? no        Primary Care Janeann Paisley:  Pearlean Brownie MD  CC:  Tina [name].  History of Present Illness: HYPERTENSION Disease Monitoring Blood pressure range:Not checking   Chest pain:N   Dyspnea:N Medications Compliance:daily has not missed any doses   Lightheadedness:N   Edema:N   DIABETES Disease Monitoring Blood Sugar ranges:not checking   Polyuria:N   Visual problems:N Medications Compliance:daily knows her meds   Hypoglycemic symptoms:N   HYPERLIPIDEMIA Disease Monitoring See symptoms for Hypertension Medications Compliance:daily simvastatin   RUQ pain:N   Muscle aches:N  PREVENTION Diet:trying to lose weight  ROS - as above PMH - Medications reviewed and updated in medication list.  Smoking Status noted in VS form       Habits & Providers  Alcohol-Tobacco-Diet     Tobacco Status: never  Current Medications (verified): 1)  Metformin Hcl 1000 Mg Tabs (Metformin Hcl) .Marland Kitchen.. 1 Two Times A Day 2)  Hydrochlorothiazide 50 Mg  Tabs (Hydrochlorothiazide) .Marland Kitchen.. 1 Daily 3)  Atenolol 25 Mg Tabs (Atenolol) .Marland Kitchen.. 1 By Mouth Two Times A Day 4)  Enalapril Maleate 20 Mg  Tabs (Enalapril Maleate) .Marland Kitchen.. 1 Two Times A Day 5)  Glimepiride 2 Mg  Tabs (Glimepiride) .Marland Kitchen.. 1 Daily For Diabetes 6)  Nexium 40 Mg Cpdr (Esomeprazole Magnesium) .Marland Kitchen.. 1 Daily 7)  Clonidine Hcl 0.1 Mg Tabs (Clonidine Hcl) .... One Two Times A Day 8)  Simvastatin 40 Mg Tabs (Simvastatin) .... Take One Tablet At Bedtime 9)  Ascensia Contour Test   Strp (Glucose Blood) .... As Directed  1 Box 10)  Ferrous Sulfate 325 (65 Fe) Mg  Tbec (Ferrous Sulfate) .... Hold Until Cbc Back 11)  Ondansetron Hcl 4 Mg Tabs (Ondansetron Hcl) .Marland Kitchen.. 1 Tab By Mouth Q 6 Hours As Needed Nausea or Vomitting 12)  Tramadol Hcl 50 Mg Tabs (Tramadol Hcl) .... Take 1-2 Tabs By Mouth Every 6 Hours As Needed For Pain  Allergies: No Known Drug Allergies  Social History: 2008 Lives alone.  First husband died, second divorced. One son died, one has brain cancer but is stable.  Lost part time job in summer of 2011  Physical Exam  General:  Well-developed,well-nourished,in no acute distress; alert,appropriate and cooperative throughout examination  obese Lungs:  Normal respiratory effort, chest expands symmetrically. Lungs are clear to auscultation, no crackles or wheezes. Heart:  Normal rate and regular rhythm. S1 and S2 normal without gallop, murmur, click, rub or other extra sounds. Extremities:  no edema   Impression & Recommendations:  Problem # 1:  DIABETES-TYPE 2 (ICD-250.00) good control  Her updated medication list for this problem includes:    Metformin Hcl 1000 Mg Tabs (Metformin hcl) .Marland Kitchen... 1 two times a day    Enalapril Maleate 20 Mg Tabs (Enalapril maleate) .Marland Kitchen... 1 two times a day    Glimepiride 2 Mg Tabs (Glimepiride) .Marland Kitchen... 1 daily for diabetes  Orders:  [facility]1C-[facility] [phone]) [facility]- Est  Level 4 (30865)  Labs Reviewed: Creat: 1.75 (07/10/2008)     Last Eye Exam: early retinophathy (09/26/2008) Reviewed HgBA1c results: 7.5 (01/07/2010)  8.1 (07/30/2009)  Problem # 2:  ESSENTIAL HYPERTENSION (ICD-401.9) not at goal.  check labs may need to increase or change atenolol  Her updated medication list for this problem includes:    Hydrochlorothiazide 50 Mg Tabs (Hydrochlorothiazide) .Marland Kitchen... 1 daily    Atenolol 25 Mg Tabs (Atenolol) .Marland Kitchen... 1 by mouth two times a day    Enalapril Maleate 20 Mg Tabs (Enalapril maleate) .Marland Kitchen... 1 two times a day    Clonidine Hcl 0.1 Mg Tabs (Clonidine hcl) ..... One  two times a day  Orders: [facility]- Est  Level 4 ([address]214)Future Orders: Comp Met-[facility] ([id number]) ... 01/17/2011 CBC-[facility] (84132) ... 01/17/2011  BP today: 148/84 Prior BP: 169/82 (09/17/2009)  Labs Reviewed: K+: 4.6 (07/10/2008) Creat: : 1.75 (07/10/2008)   Chol: 145 (12/16/2007)   HDL: 31 (12/16/2007)   LDL: 83 (12/16/2007)   TG: 157 (12/16/2007)  Problem # 3:  HYPERLIPIDEMIA (ICD-272.2) check labs unsure of control  Her updated medication list for this problem includes:    Simvastatin 40 Mg Tabs (Simvastatin) .Marland Kitchen... Take one tablet at bedtime  Orders: [facility]- Est  Level 4 ([address]214)Future Orders: Lipid-[facility] ([id number]) ... 01/17/2011  Labs Reviewed: SGOT: 10 (07/10/2008)   SGPT: 9 (07/10/2008)   HDL:31 (12/16/2007)  LDL:83 (12/16/2007)  Chol:145 (12/16/2007)  Trig:157 (12/16/2007)  Complete Medication List: 1)  Metformin Hcl 1000 Mg Tabs (Metformin hcl) .Marland Kitchen.. 1 two times a day 2)  Hydrochlorothiazide 50 Mg Tabs (Hydrochlorothiazide) .Marland Kitchen.. 1 daily 3)  Atenolol 25 Mg Tabs (Atenolol) .Marland Kitchen.. 1 by mouth two times a day 4)  Enalapril Maleate 20 Mg Tabs (Enalapril maleate) .Marland Kitchen.. 1 two times a day 5)  Glimepiride 2 Mg Tabs (Glimepiride) .Marland Kitchen.. 1 daily for diabetes 6)  Nexium 40 Mg Cpdr (Esomeprazole magnesium) .Marland Kitchen.. 1 daily 7)  Clonidine Hcl 0.1 Mg Tabs (Clonidine hcl) .... One two times a day 8)  Simvastatin 40 Mg Tabs (Simvastatin) .... Take one tablet at bedtime 9)  Ascensia Contour Test Strp (Glucose blood) .... As directed 1 box 10)  Ferrous Sulfate 325 (65 Fe) Mg Tbec (Ferrous sulfate) .... Hold until cbc back 11)  Ondansetron Hcl 4 Mg Tabs (Ondansetron hcl) .Marland Kitchen.. 1 tab by mouth q 6 hours as needed nausea or vomitting 12)  Tramadol Hcl 50 Mg Tabs (Tramadol hcl) .... Take 1-2 tabs by mouth every 6 hours as needed for pain  Patient Instructions: 1)  Please schedule a follow-up appointment in 3 months .  2)  I will call you if your lab is abnormal otherwise I will send you a letter within 2  weeks. 3)  Call me with the name of a gastroenterologist (stomach doctor) to do your colonoscopy 4)  You need a mammogram Prescriptions: SIMVASTATIN 40 MG TABS (SIMVASTATIN) Take one tablet at bedtime  #90 x 0   Entered and Authorized by:   Pearlean Brownie MD   Signed by:   Pearlean Brownie MD on 01/07/2010   Method used:   Faxed to ...       [facility] (mail-order)             , [place]         Ph: [phone]       Fax: [phone]   RxID:   [id number] ENALAPRIL MALEATE 20 MG  TABS (ENALAPRIL MALEATE) 1 two times a day  #  180 x 0   Entered and Authorized by:   Pearlean Brownie MD   Signed by:   Pearlean Brownie MD on 01/07/2010   Method used:   Faxed to ...       [facility] (mail-order)             , [place]         Ph: [phone]       Fax: [phone]   RxID:   [id number] GLIMEPIRIDE 2 MG  TABS (GLIMEPIRIDE) 1 daily for diabetes  #90 x 0   Entered and Authorized by:   Pearlean Brownie MD   Signed by:   Pearlean Brownie MD on 01/07/2010   Method used:   Faxed to ...       [facility] (mail-order)             , [place]         Ph: [phone]       Fax: [phone]   RxID:   [id number] METFORMIN HCL 1000 MG TABS (METFORMIN HCL) 1 two times a day  #180 x 0   Entered and Authorized by:   Pearlean Brownie MD   Signed by:   Pearlean Brownie MD on 01/07/2010   Method used:   Faxed to ...       [facility] (mail-order)             , [place]         Ph: [phone]       Fax: [phone]   RxID:   [id number] CLONIDINE HCL 0.1 MG TABS (CLONIDINE HCL) one two times a day  #180 x 0   Entered and Authorized by:   Pearlean Brownie MD   Signed by:   Pearlean Brownie MD on 01/07/2010   Method used:   Faxed to ...       [facility] (mail-order)             , [place]         Ph: [phone]       Fax: [phone]   RxID:   [id number] HYDROCHLOROTHIAZIDE 50 MG  TABS (HYDROCHLOROTHIAZIDE) 1 daily  #90 x 0    Entered and Authorized by:   Pearlean Brownie MD   Signed by:   Pearlean Brownie MD on 01/07/2010   Method used:   Faxed to ...       [facility] (mail-order)             , [place]         Ph: [phone]       Fax: [phone]   RxID:   [id number] ATENOLOL 25 MG TABS (ATENOLOL) 1 by mouth two times a day  #180 x 0   Entered and Authorized by:   Pearlean Brownie MD   Signed by:   Pearlean Brownie MD on 01/07/2010   Method used:   Faxed to ...       [facility] (mail-order)             , [place]         Ph: [phone]       Fax: [phone]   RxID:   [id number] NEXIUM 40 MG CPDR (ESOMEPRAZOLE MAGNESIUM) 1 daily  #90 x 0   Entered and Authorized by:   Pearlean Brownie MD   Signed by:   Pearlean Brownie MD on 01/07/2010   Method used:   Faxed to .Marland KitchenMarland Kitchen  [facility] (mail-order)             , [place]         Ph: [phone]       Fax: [phone]   RxID:   [id number]   Laboratory Results   Blood Tests   Date/Time Received: January 07, 2010 2:21 PM  Date/Time Reported: January 07, 2010 2:40 PM   HGBA1C: 7.5%   (Normal Range: Non-Diabetic - 3-6%   Control Diabetic - 6-8%)  Comments: ...............test performed by.................Marland KitchenGaren Grams, LPN .............entered by...........Marland KitchenBonnie A. [place], MLS (ASCP)cm       Prevention & Chronic Care Immunizations   Influenza vaccine: Fluvax 3+  (04/02/2009)   Influenza vaccine due: 02/24/2009    Tetanus booster: Not documented    Pneumococcal vaccine: Pneumovax  (04/02/2009)    H. zoster vaccine: Not documented  Colorectal Screening   Hemoccult: Not documented   Hemoccult due: Not Indicated    Colonoscopy: Not documented  Other Screening   Pap smear: Not documented    Mammogram: Not documented    DXA bone density scan: Not documented   Smoking status: never  (01/07/2010)  Diabetes Mellitus   HgbA1C: 7.5  (01/07/2010)   Hemoglobin A1C due: 09/14/2007    Eye  exam: early retinophathy  (09/26/2008)   Eye exam due: 09/26/2009    Foot exam: yes  (12/16/2007)   High risk foot: Not documented   Foot care education: Not documented   Foot exam due: 12/15/2008    Urine microalbumin/creatinine ratio: Not documented   Urine microalbumin action/deferral: Not indicated  Lipids   Total Cholesterol: 145  (12/16/2007)   LDL: 83  (12/16/2007)   LDL Direct: Not documented   HDL: 31  (12/16/2007)   Triglycerides: 157  (12/16/2007)    SGOT (AST): 10  (07/10/2008)   SGPT (ALT): 9  (07/10/2008) CMP ordered    Alkaline phosphatase: 65  (07/10/2008)   Total bilirubin: 0.3  (07/10/2008)  Hypertension   Last Blood Pressure: 148 / 84  (01/07/2010)   Serum creatinine: 1.75  (07/10/2008)   Serum potassium 4.6  (07/10/2008) CMP ordered   Self-Management Support :   Personal Goals (by the next clinic visit) :     Personal A1C goal: 8  (04/02/2009)     Personal blood pressure goal: 140/90  (04/02/2009)     Personal LDL goal: 100  (04/02/2009)    Diabetes self-management support: Not documented    Diabetes self-management support not done because: Good outcomes  (04/02/2009)    Hypertension self-management support: Not documented    Hypertension self-management support not done because: Good outcomes  (04/23/2009)    Lipid self-management support: Not documented     Lipid self-management support not done because: Good outcomes  (04/23/2009)

## 2010-06-13 NOTE — Progress Notes (Signed)
Summary: results - Order Renal US  Phone Note Call from Patient Call back at Home Phone [phone]   Caller: Patient Call For: Pearlean Brownie MD Complaint: Cough/Sore throat Summary of Call: wants to know about her results stated that she got a call from Dr. Deirdre Priest   Follow-up for Phone Call        Called dscussed her labs with her Recommend continue iron No NSAIDS Get [place] and UA for increasing creat  Follow-up by: Pearlean Brownie MD,  January 11, 2010 4:55 PM  Additional Follow-up for Phone Call Additional follow up Details #1::        see order Additional Follow-up by: Jone Baseman CMA,  January 15, 2010 10:50 AM  New Problems: RENAL DISEASE, CHRONIC, STAGE II (ICD-585.2)   New Problems: RENAL DISEASE, CHRONIC, STAGE II (ICD-585.2)

## 2010-06-13 NOTE — Miscellaneous (Signed)
Summary: walk in  Clinical Lists Changes came in c/o uti symptoms x3-4 days. states she knows she has one. says they never really go away. no appt this am. states she must be seen now. sent to UC as we did not have anything this am & she did not want to come back this pm..[facility] RN  January 17, 2010 10:56 AM

## 2010-06-13 NOTE — Assessment & Plan Note (Signed)
Summary: uti per pt//[facility]   Vital Signs:  Patient profile:   70 year old female Height:      65 inches Weight:      252.1 pounds BMI:     42.10 Temp:     97.6 degrees F oral Pulse rate:   76 / minute BP sitting:   169 / 82  (left arm) Cuff size:   regular  Vitals Entered By: Garen Grams LPN (Sep 17, 452 2:25 PM) CC: ? uti Is Patient Diabetic? No Pain Assessment Patient in pain? no        Primary Care Provider:  Pearlean Brownie MD  CC:  ? uti.  History of Present Illness: 70 yo female here with dysuria.  Pt states it has been at leaast two days but states she has had some symptoms since the last time she was treated for a UTI.  Pt states the last med she used (cipro) did no help.  Pt states she has increase frequency and increasse pain.  Pt denies seeing any blood or any vaginal discharge.  Pt states she has multiple UTI and wants to know why and does not believe that it is from her sugar, which Dr. Deirdre Priest told her was the cause   Habits & Providers  Alcohol-Tobacco-Diet     Tobacco Status: never  Current Medications (verified): 1)  Metformin Hcl 1000 Mg Tabs (Metformin Hcl) .Marland Kitchen.. 1 Two Times A Day 2)  Hydrochlorothiazide 50 Mg  Tabs (Hydrochlorothiazide) .Marland Kitchen.. 1 Daily 3)  Atenolol 25 Mg Tabs (Atenolol) .Marland Kitchen.. 1 By Mouth Two Times A Day 4)  Enalapril Maleate 20 Mg  Tabs (Enalapril Maleate) .Marland Kitchen.. 1 Two Times A Day 5)  Glimepiride 2 Mg  Tabs (Glimepiride) .Marland Kitchen.. 1 Daily For Diabetes (New Lower Dose) 6)  Nexium 40 Mg Cpdr (Esomeprazole Magnesium) .Marland Kitchen.. 1 Daily 7)  Clonidine Hcl 0.1 Mg Tabs (Clonidine Hcl) .... One Two Times A Day For One Week Then Stop 8)  Simvastatin 40 Mg Tabs (Simvastatin) .... Take One Tablet At Bedtime 9)  Ascensia Contour Test   Strp (Glucose Blood) .... As Directed 1 Box 10)  Ferrous Sulfate 325 (65 Fe) Mg  Tbec (Ferrous Sulfate) .... Two Times A Day 11)  Ondansetron Hcl 4 Mg Tabs (Ondansetron Hcl) .Marland Kitchen.. 1 Tab By Mouth Q 6 Hours As Needed Nausea  or Vomitting 12)  Tramadol Hcl 50 Mg Tabs (Tramadol Hcl) .... Take 1-2 Tabs By Mouth Every 6 Hours As Needed For Pain 13)  Keflex 500 Mg Caps (Cephalexin) .... Take 1 Tab By Mouth Two Times A Day For 7 Days  Allergies (verified): No Known Drug Allergies  Past History:  Past medical, surgical, family and social histories (including risk factors) reviewed, and no changes noted (except as noted below).  Past Medical History: Reviewed history from 03/02/2007 and no changes required. Sestamibi Cardiac Test Neg 6/07 at [facility]  Ej fracture - 79%  Diabetes - has been on avandia in past but stopped at [facility].  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 Knee surgery as a child Bladder surgery in 1994  Family History: Reviewed history from 02/11/2007 and no changes required. Mother died of MI at age 65 Diabetes in all sibling and mother Hypertension in all siblings  Social History: Reviewed history from 04/19/2009 and no changes required. 2008 Lives alone.  First husband died, second divorced.  One son died, one has brain cancer but is stable.  Working 2 hrs per day in a [employer].  Physical Exam  General:  Well-developed,well-nourished,in no acute distress; alert,appropriate and cooperative throughout examination Mouth:  MMM Lungs:  CTAB Heart:  RRR no murmur Abdomen:  tender to palpation suprpubic BS +, ND Pulses:  2+ radial pulses Extremities:  good cap refill in fingers.    Impression & Recommendations:  Problem # 1:  DYSURIA (ICD-788.1) Assessment Comment Only Likely UTI, large LE, will treat wih Keflex, pt states that cipro seemed to never work.  Pt has had multiple UTI's likely secondary to poor glucose control but would like renal u/s.  Told pt that would be a decision for her PCP.  The following medications were removed from the medication  list:    Cipro 250 Mg Tabs (Ciprofloxacin hcl) .Marland Kitchen... 1 by mouth two times a day for 7 days Her updated medication list for this problem includes:    Keflex 500 Mg Caps (Cephalexin) .Marland Kitchen... Take 1 tab by mouth two times a day for 7 days  Orders: Urinalysis-[facility] (00000) [facility]- Est Level  3 ([address]64)  Complete Medication List: 1)  Metformin Hcl 1000 Mg Tabs (Metformin hcl) .Marland Kitchen.. 1 two times a day 2)  Hydrochlorothiazide 50 Mg Tabs (Hydrochlorothiazide) .Marland Kitchen.. 1 daily 3)  Atenolol 25 Mg Tabs (Atenolol) .Marland Kitchen.. 1 by mouth two times a day 4)  Enalapril Maleate 20 Mg Tabs (Enalapril maleate) .Marland Kitchen.. 1 two times a day 5)  Glimepiride 2 Mg Tabs (Glimepiride) .Marland Kitchen.. 1 daily for diabetes (new lower dose) 6)  Nexium 40 Mg Cpdr (Esomeprazole magnesium) .Marland Kitchen.. 1 daily 7)  Clonidine Hcl 0.1 Mg Tabs (Clonidine hcl) .... One two times a day for one week then stop 8)  Simvastatin 40 Mg Tabs (Simvastatin) .... Take one tablet at bedtime 9)  Ascensia Contour Test Strp (Glucose blood) .... As directed 1 box 10)  Ferrous Sulfate 325 (65 Fe) Mg Tbec (Ferrous sulfate) .... Two times a day 11)  Ondansetron Hcl 4 Mg Tabs (Ondansetron hcl) .Marland Kitchen.. 1 tab by mouth q 6 hours as needed nausea or vomitting 12)  Tramadol Hcl 50 Mg Tabs (Tramadol hcl) .... Take 1-2 tabs by mouth every 6 hours as needed for pain 13)  Keflex 500 Mg Caps (Cephalexin) .... Take 1 tab by mouth two times a day for 7 days  Patient Instructions: 1)  I am giving you keflex.  Take 1 tab by mouth  for 7 days 2)  If not better or you get a fever please come back 3)  Otherwise follow up with dr. Deirdre Priest as needed  Prescriptions: KEFLEX 500 MG CAPS (CEPHALEXIN) take 1 tab by mouth two times a day for 7 days  #14 x 1   Entered and Authorized by:   Antoine Primas DO   Signed by:   Antoine Primas DO on 09/17/2009   Method used:   Electronically to        [facility]* (retail)       [address]       [place], [place]  [address]       Ph: [phone]       Fax:  [phone]   RxID:   [id number]   Laboratory Results   Urine Tests  Date/Time Received: Sep 17, 2009 2:14 PM  Date/Time Reported: Sep 17, 2009 2:30 PM   Routine Urinalysis   Color: yellow Appearance: Clear Glucose: negative   (Normal Range: Negative) Bilirubin:  negative   (Normal Range: Negative) Ketone: negative   (Normal Range: Negative) Spec. Gravity: 1.015   (Normal Range: 1.003-1.035) Blood: trace-intact   (Normal Range: Negative) pH: 5.0   (Normal Range: 5.0-8.0) Protein: negative   (Normal Range: Negative) Urobilinogen: 0.2   (Normal Range: 0-1) Nitrite: negative   (Normal Range: Negative) Leukocyte Esterace: large   (Normal Range: Negative)    Comments: 1 cc specimen,  QNS to spin,  ...............test performed by......Marland KitchenBonnie A. [place], MLS (ASCP)cm

## 2010-06-13 NOTE — Consult Note (Signed)
Summary: [facility]  [facility]   Imported By: Clydell Hakim 09/06/2009 15:39:06  _____________________________________________________________________  External Attachment:    Type:   Image     Comment:   External Document

## 2010-06-13 NOTE — Consult Note (Signed)
Summary: [facility]  - Request for Medical Clearance  [facility]   Imported By: Clydell Hakim 09/03/2009 15:52:15  _____________________________________________________________________  External Attachment:    Type:   Image     Comment:   External Document

## 2010-06-13 NOTE — Miscellaneous (Signed)
  Clinical Lists Changes  Problems: Changed problem from RENAL DISEASE, CHRONIC, STAGE II (ICD-585.2) to CHRONIC KIDNEY DISEASE STAGE III (MODERATE) (ICD-585.3)

## 2010-06-13 NOTE — Assessment & Plan Note (Signed)
Summary: n &v, diarrhea (has DM)/[place]/[facility]   Vital Signs:  Patient profile:   70 year old female Height:      65 inches Weight:      244.8 pounds BMI:     40.88 Temp:     98.0 degrees F oral Pulse rate:   77 / minute BP sitting:   137 / 80  (left arm) Cuff size:   regular  Vitals Entered By: Garen Grams LPN (September 07, 2009 3:33 PM) CC: n/v, diarrhea x 3 days Is Patient Diabetic? Yes Did you bring your meter with you today? No Pain Assessment Patient in pain? yes     Location: stomach   Primary Care Provider:  Pearlean Brownie MD  CC:  n/v and diarrhea x 3 days.  History of Present Illness: 70 yo female coming in with 2 day hx of n/v/d.  Pt states the vomitng came first, is now clear no blood, able to keep down liquids but unable to eat solids.  Pt states that her stomach feels like it is cramping as well.  Pt also says she has diarrhea, regular color, no blood, no black.  Pt denies any fever.  Pt niece did have something similar to this last week.  No dysuria or increase frequency  Habits & Providers  Alcohol-Tobacco-Diet     Tobacco Status: never  Current Medications (verified): 1)  Metformin Hcl 1000 Mg Tabs (Metformin Hcl) .Marland Kitchen.. 1 Two Times A Day 2)  Hydrochlorothiazide 50 Mg  Tabs (Hydrochlorothiazide) .Marland Kitchen.. 1 Daily 3)  Atenolol 25 Mg Tabs (Atenolol) .Marland Kitchen.. 1 By Mouth Two Times A Day 4)  Enalapril Maleate 20 Mg  Tabs (Enalapril Maleate) .Marland Kitchen.. 1 Two Times A Day 5)  Glimepiride 2 Mg  Tabs (Glimepiride) .Marland Kitchen.. 1 Daily For Diabetes (New Lower Dose) 6)  Nexium 40 Mg Cpdr (Esomeprazole Magnesium) .Marland Kitchen.. 1 Daily 7)  Clonidine Hcl 0.1 Mg Tabs (Clonidine Hcl) .... One Two Times A Day For One Week Then Stop 8)  Simvastatin 40 Mg Tabs (Simvastatin) .... Take One Tablet At Bedtime 9)  Ascensia Contour Test   Strp (Glucose Blood) .... As Directed 1 Box 10)  Ferrous Sulfate 325 (65 Fe) Mg  Tbec (Ferrous Sulfate) .... Two Times A Day 11)  Ondansetron Hcl 4 Mg Tabs (Ondansetron Hcl)  .Marland Kitchen.. 1 Tab By Mouth Q 6 Hours As Needed Nausea or Vomitting 12)  Tramadol Hcl 50 Mg Tabs (Tramadol Hcl) .... Take 1-2 Tabs By Mouth Every 6 Hours As Needed For Pain 13)  Cipro 250 Mg Tabs (Ciprofloxacin Hcl) .Marland Kitchen.. 1 By Mouth Two Times A Day For 7 Days  Allergies (verified): No Known Drug Allergies  Review of Systems       denies fever, chills, sob, cp  Physical Exam  General:  Well-developed,well-nourished,in no acute distress; alert,appropriate and cooperative throughout examination Mouth:  MMM Lungs:  CTAB Heart:  RRR no murmur Abdomen:  no costrovertebral angle tenderness , BS+, NT, ND   Impression & Recommendations:  Problem # 1:  VIRAL GASTROENTERITIS (ICD-008.8) told pt to get pedialyte, given rx for zofran, told if get fever or can't keep fluids down to go to hospital this weekend.  Gave pt note to stay out of work until [employer].  Advance diet slowly Orders: [facility]- Est Level  3 ([address])  Complete Medication List: 1)  Metformin Hcl 1000 Mg Tabs (Metformin hcl) .Marland Kitchen.. 1 two times a day 2)  Hydrochlorothiazide 50 Mg Tabs (Hydrochlorothiazide) .Marland Kitchen.. 1 daily 3)  Atenolol 25 Mg  Tabs (Atenolol) .Marland Kitchen.. 1 by mouth two times a day 4)  Enalapril Maleate 20 Mg Tabs (Enalapril maleate) .Marland Kitchen.. 1 two times a day 5)  Glimepiride 2 Mg Tabs (Glimepiride) .Marland Kitchen.. 1 daily for diabetes (new lower dose) 6)  Nexium 40 Mg Cpdr (Esomeprazole magnesium) .Marland Kitchen.. 1 daily 7)  Clonidine Hcl 0.1 Mg Tabs (Clonidine hcl) .... One two times a day for one week then stop 8)  Simvastatin 40 Mg Tabs (Simvastatin) .... Take one tablet at bedtime 9)  Ascensia Contour Test Strp (Glucose blood) .... As directed 1 box 10)  Ferrous Sulfate 325 (65 Fe) Mg Tbec (Ferrous sulfate) .... Two times a day 11)  Ondansetron Hcl 4 Mg Tabs (Ondansetron hcl) .Marland Kitchen.. 1 tab by mouth q 6 hours as needed nausea or vomitting 12)  Tramadol Hcl 50 Mg Tabs (Tramadol hcl) .... Take 1-2 tabs by mouth every 6 hours as needed for pain 13)  Cipro 250 Mg Tabs  (Ciprofloxacin hcl) .Marland Kitchen.. 1 by mouth two times a day for 7 days  Patient Instructions: 1)  Nice to meet you 2)  I sent you in a script for zofran.  You can take one pill up to every 6 hours as needed for nausea 3)  I want you to stop your enalpril until you are able to start drinking fluids, then restart normally 4)  I want you to try to drink some pedialyte 5)  Try to drink some broth first and then try to advance diet 6)  If you get a fever or see blood in your stool please come back 7)  Likely this is a viru ans it will take time for it to pass 8)  I hope you feel better Prescriptions: ONDANSETRON HCL 4 MG TABS (ONDANSETRON HCL) 1 tab by mouth q 6 hours as needed nausea or vomitting  #30 x 0   Entered and Authorized by:   Antoine Primas DO   Signed by:   Antoine Primas DO on 09/07/2009   Method used:   Electronically to        [facility]* (retail)       [address]       [place], [place]  [address]       Ph: [phone]       Fax: [phone]   RxID:   [id number]

## 2010-06-13 NOTE — Assessment & Plan Note (Signed)
Summary: F/U VISIT/[facility]   Vital Signs:  Patient profile:   70 year old female Height:      65 inches Weight:      246.6 pounds BMI:     41.18 Temp:     98.2 degrees F oral Pulse rate:   80 / minute BP sitting:   162 / 76  (right arm) Cuff size:   large  Vitals Entered By: Garen Grams LPN (July 30, 2009 1:49 PM) CC: f/u Is Patient Diabetic? Yes Did you bring your meter with you today? No Pain Assessment Patient in pain? no        Primary Care Provider:  Pearlean Brownie MD  CC:  f/u.  History of Present Illness: HYPERTENSION Disease Monitoring Blood pressure range:not checking   Chest pain:N   Dyspnea:N Medications Compliance:did not bring in her medicines but relates took them today   Lightheadedness:N   Edema:N   DIABETES Disease Monitoring Blood Sugar ranges:usually in 100s sometimes 200s   Polyuria:N   Visual problems:N Medications Compliance:daily   Hypoglycemic symptoms:N   HYPERLIPIDEMIA Disease Monitoring See symptoms for Hypertension Medications Compliance:daily simvastatin   RUQ pain:N   Muscle aches:N  DYSURIA Onset:  3 days ago Description: frequency and burning Modifying factors: just like previous utis  Symptoms Urgency: Y  Frequency: Y  Hesitancy: N  Hematuria: N  Flank Pain: N  Fever: N Nausea/Vomiting: N  Red Flags   More than 3 UTI's last 12 months:  Y PMH of  Diabetes or Immunosuppression: Y  Renal Disease/Calculi: N Urinary Tract Abnormality: N  Instrumentation or Trauma: N   ROS - as above PMH - Medications reviewed and updated in medication list.  Smoking Status noted in VS form      Habits & Providers  Alcohol-Tobacco-Diet     Tobacco Status: never  Current Medications (verified): 1)  Metformin Hcl 1000 Mg Tabs (Metformin Hcl) .Marland Kitchen.. 1 Two Times A Day 2)  Hydrochlorothiazide 50 Mg  Tabs (Hydrochlorothiazide) .Marland Kitchen.. 1 Daily 3)  Atenolol 25 Mg Tabs (Atenolol) .Marland Kitchen.. 1 By Mouth Two Times A Day 4)  Enalapril Maleate  20 Mg  Tabs (Enalapril Maleate) .Marland Kitchen.. 1 Two Times A Day 5)  Glimepiride 2 Mg  Tabs (Glimepiride) .Marland Kitchen.. 1 Daily For Diabetes (New Lower Dose) 6)  Nexium 40 Mg Cpdr (Esomeprazole Magnesium) .Marland Kitchen.. 1 Daily 7)  Clonidine Hcl 0.1 Mg Tabs (Clonidine Hcl) .... One Two Times A Day For One Week Then Stop 8)  Simvastatin 40 Mg Tabs (Simvastatin) .... Take One Tablet At Bedtime 9)  Ascensia Contour Test   Strp (Glucose Blood) .... As Directed 1 Box 10)  Ferrous Sulfate 325 (65 Fe) Mg  Tbec (Ferrous Sulfate) .... Two Times A Day 11)  Ondansetron Hcl 4 Mg Tabs (Ondansetron Hcl) .Marland Kitchen.. 1 Tab By Mouth Q 6 Hours As Needed Nausea or Vomitting 12)  Tramadol Hcl 50 Mg Tabs (Tramadol Hcl) .... Take 1-2 Tabs By Mouth Every 6 Hours As Needed For Pain 13)  Cipro 250 Mg Tabs (Ciprofloxacin Hcl) .Marland Kitchen.. 1 By Mouth Two Times A Day For 7 Days  Allergies: No Known Drug Allergies  Physical Exam  General:  Well-developed,well-nourished,in no acute distress; alert,appropriate and cooperative throughout examination Lungs:  Normal respiratory effort, chest expands symmetrically. Lungs are clear to auscultation, no crackles or wheezes. Heart:  Normal rate and regular rhythm. S1 and S2 normal without gallop, murmur, click, rub or other extra sounds. Abdomen:  no costrovertebral angle tenderness  Skin:  no rash  Impression & Recommendations:  Problem # 1:  DYSURIA (ICD-788.1) probable recurrent UTI.  Not enough to culture - treat empirically with cipro  Her updated medication list for this problem includes:    Cipro 250 Mg Tabs (Ciprofloxacin hcl) .Marland Kitchen... 1 by mouth two times a day for 7 days  Orders: Urinalysis-[facility] (00000) [facility]- Est  Level 4 ([address])  Problem # 2:  DIABETES-TYPE 2 (ICD-250.00) Worsened Discussed diet  and weight loss Her updated medication list for this problem includes:    Metformin Hcl 1000 Mg Tabs (Metformin hcl) .Marland Kitchen... 1 two times a day    Enalapril Maleate 20 Mg Tabs (Enalapril maleate) .Marland Kitchen... 1 two  times a day    Glimepiride 2 Mg Tabs (Glimepiride) .Marland Kitchen... 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  Will increse atenolol if not better controlled next visit Her updated medication list for this problem includes:    Hydrochlorothiazide 50 Mg Tabs (Hydrochlorothiazide) .Marland Kitchen... 1 daily    Atenolol 25 Mg Tabs (Atenolol) .Marland Kitchen... 1 by mouth two times a day    Enalapril Maleate 20 Mg Tabs (Enalapril maleate) .Marland Kitchen... 1 two times a day    Clonidine Hcl 0.1 Mg Tabs (Clonidine hcl) ..... One two times a day for one week then stop  BP today: 162/76 Prior BP: 187/84 (05/09/2009)  Labs Reviewed: K+: 4.6 (07/10/2008) Creat: : 1.75 (07/10/2008)   Chol: 145 (12/16/2007)   HDL: 31 (12/16/2007)   LDL: 83 (12/16/2007)   TG: 157 (12/16/2007)  Orders: [facility]- Est  Level 4 ([address])  Problem # 4:  HYPERLIPIDEMIA (ICD-272.2) Needs FLP Her updated medication list for this problem includes:    Simvastatin 40 Mg Tabs (Simvastatin) .Marland Kitchen... Take one tablet at bedtime  Complete Medication List: 1)  Metformin Hcl 1000 Mg Tabs (Metformin hcl) .Marland Kitchen.. 1 two times a day 2)  Hydrochlorothiazide 50 Mg Tabs (Hydrochlorothiazide) .Marland Kitchen.. 1 daily 3)  Atenolol 25 Mg Tabs (Atenolol) .Marland Kitchen.. 1 by mouth two times a day 4)  Enalapril Maleate 20 Mg Tabs (Enalapril maleate) .Marland Kitchen.. 1 two times a day 5)  Glimepiride 2 Mg Tabs (Glimepiride) .Marland Kitchen.. 1 daily for diabetes (new lower dose) 6)  Nexium 40 Mg Cpdr (Esomeprazole magnesium) .Marland Kitchen.. 1 daily 7)  Clonidine Hcl 0.1 Mg Tabs (Clonidine hcl) .... One two times a day for one week then stop 8)  Simvastatin 40 Mg Tabs (Simvastatin) .... Take one tablet  at bedtime 9)  Ascensia Contour Test Strp (Glucose blood) .... As directed 1 box 10)  Ferrous Sulfate 325 (65 Fe) Mg Tbec (Ferrous sulfate) .... Two times a day 11)  Ondansetron Hcl 4 Mg Tabs (Ondansetron hcl) .Marland Kitchen.. 1 tab by mouth q 6 hours as needed nausea or vomitting 12)  Tramadol Hcl 50 Mg Tabs (Tramadol hcl) .... Take 1-2 tabs by mouth every 6 hours as needed for pain 13)  Cipro 250 Mg Tabs (Ciprofloxacin hcl) .Marland Kitchen.. 1 by mouth two times a day for 7 days  Patient Instructions: 1)  Please schedule a follow-up appointment in 3 months .  2)  Losing weight will help with diabetes and your knee pain.  Try to eat less every meal 3)  Get your mammogram 4)  Schedule your colonoscopy 5)  Call if your UTI is not better in 3 days or if you get fever 6)  Come in FASTING next visit  7)  BRING ALL YOUR MEDICINES Prescriptions: CIPRO 250 MG TABS (CIPROFLOXACIN HCL) 1 by mouth two times a day for 7 days  #14 x 1   Entered and Authorized by:   Pearlean Brownie MD   Signed by:   Pearlean Brownie MD on 07/30/2009   Method used:   Electronically to        [facility] [id number] (retail)       [address]       [place], [place]  [address]       Ph: [phone]       Fax: [phone]   RxID:   [id number] ONDANSETRON HCL 4 MG TABS (ONDANSETRON HCL) 1 tab by mouth q 6 hours as needed nausea or vomitting  #20 x 1   Entered and Authorized by:   Pearlean Brownie MD   Signed by:   Pearlean Brownie MD on 07/30/2009   Method used:   Electronically to        [facility]  [address] [phone]* (retail)       [address]       [place], [place]  [address]       Ph: [phone]       Fax: [phone]   RxID:   [id number]   Laboratory Results   Urine Tests  Date/Time Received: July 30, 2009 1:51 PM  Date/Time Reported: July 30, 2009 2:07 PM   Routine Urinalysis   Color: yellow Appearance: Clear Glucose: 500   (Normal Range:  Negative) Bilirubin: negative   (Normal Range: Negative) Ketone: negative   (Normal Range: Negative) Spec. Gravity: 1.015   (Normal Range: 1.003-1.035) Blood: negative   (Normal Range: Negative) pH: 5.0   (Normal Range: 5.0-8.0) Protein: negative   (Normal Range: Negative) Urobilinogen: 1.0   (Normal Range: 0-1) Nitrite: negative   (Normal Range: Negative) Leukocyte Esterace: trace   (Normal Range: Negative)    Comments: QNS for micro ...............test performed by......Marland KitchenBonnie A. [place], MLS (ASCP)cm   Blood Tests   Date/Time Received: July 30, 2009 1:51 PM  Date/Time Reported: July 30, 2009 2:05 PM   HGBA1C: 8.1%   (Normal Range: Non-Diabetic - 3-6%   Control Diabetic - 6-8%)  Comments: ...............test performed by.................Marland KitchenGaren Grams, LPN .............entered by...........Marland KitchenBonnie A. [place], MLS (ASCP)cm

## 2010-06-13 NOTE — Letter (Signed)
Summary: Wellness Visit Letter  [facility]  [address]   [place], [place] [address]   Phone: [phone]  Fax: [phone]    10/10/2009  Tina [name] [address] [place], [place]  [address]  Dear Ms. Burress,  We are happy to let you know that since you are covered under Medicare you are able to have a FREE visit at the [facility] to discuss your HEALTH. This is a new benefit for Medicare.  There will be no co-payment.  At this visit you will meet with Luretha Murphy an expert in wellness and the nurse practitioner at our clinic.  At this visit we will discuss ways to keep you healthy and feeling well.  This visit will not replace your regular doctor visit and we cannot refill medications.  We may schedule future blood work, give shots if needed, or schedule tests to look for hidden problems.   You will need to plan to be here at least one hour to talk about your medical history, your current status, review all of your medications, and discuss your future plans for your health.  This information will be entered into your record for your doctor to have and review.  If you are interested in staying healthy, this type of visit can help.  Please call the office at: [phone], to schedule a "Medicare Wellness Visit".  The day of the visit you should bring in all of your medications, including any vitamins, herbs, over the counter products you take.  Make a list of all the other doctors that you see, so we know who they are. If you have any other health documents please bring them.  We look forward to helping you stay healthy.    Sincerely,   Luretha Murphy NP  Appended Document: Wellness Visit Letter mailed.

## 2010-06-13 NOTE — Miscellaneous (Signed)
Summary: bladder problems  Clinical Lists Changes suprapubic pain & with urination x 1 week. no OTC used. wants to be seen now. placed in work in & explained there may be a wait but we will see her.she agreed with wait.Golden Circle RN  Sep 17, 2009 1:48 PM

## 2010-06-20 ENCOUNTER — Telehealth: Payer: Self-pay | Admitting: Home Health Services

## 2010-06-20 NOTE — Telephone Encounter (Signed)
Phone call to follow up with patient about the chair exercise goal set at last appointment with PCP.  Goal was while sitting to lift water bottles.  Also to lift legs up and down while sitting down.  Patient reported that her legs hurt and has not been doing leg exercises but is doing the arm exercises with the water bottles.  Patient will be coming in for follow up visit with PCP end of February.  Will review and  set dietary/exercise goals at that time.  Arlys John

## 2010-07-03 ENCOUNTER — Ambulatory Visit: Payer: Self-pay | Admitting: Family Medicine

## 2010-07-24 ENCOUNTER — Encounter: Payer: Self-pay | Admitting: Family Medicine

## 2010-07-24 ENCOUNTER — Ambulatory Visit: Payer: Self-pay | Admitting: Family Medicine

## 2010-07-25 LAB — POCT URINALYSIS DIPSTICK
Bilirubin Urine: NEGATIVE
Glucose, UA: NEGATIVE mg/dL
Ketones, ur: NEGATIVE mg/dL
Nitrite: NEGATIVE

## 2010-07-25 LAB — URINE CULTURE
Colony Count: >=100000
Culture  Setup Time: 201109081751

## 2010-08-12 ENCOUNTER — Encounter: Payer: Self-pay | Admitting: Family Medicine

## 2010-08-12 ENCOUNTER — Ambulatory Visit (INDEPENDENT_AMBULATORY_CARE_PROVIDER_SITE_OTHER): Payer: Medicare HMO | Admitting: Family Medicine

## 2010-08-12 VITALS — BP 170/80 | HR 73 | Temp 98.2°F | Wt 249.4 lb

## 2010-08-12 DIAGNOSIS — I10 Essential (primary) hypertension: Secondary | ICD-10-CM

## 2010-08-12 DIAGNOSIS — M1990 Unspecified osteoarthritis, unspecified site: Secondary | ICD-10-CM

## 2010-08-12 LAB — COMPREHENSIVE METABOLIC PANEL
ALT: <8 U/L (ref 0–35)
BUN: 42 mg/dL — ABNORMAL HIGH (ref 6–23)
CO2: 22 mEq/L (ref 19–32)
Calcium: 8.1 mg/dL — ABNORMAL LOW (ref 8.4–10.5)
Chloride: 109 mEq/L (ref 96–112)
Creat: 2.19 mg/dL — ABNORMAL HIGH (ref 0.40–1.20)
Glucose, Bld: 68 mg/dL — ABNORMAL LOW (ref 70–99)

## 2010-08-12 LAB — PHOSPHORUS: Phosphorus: 3.5 mg/dL (ref 2.3–4.6)

## 2010-08-12 NOTE — Progress Notes (Signed)
  Subjective:    Patient ID: Tina [name], female    DOB: 1940-12-07, 70 y.o.   MRN: [id number]  HPI  HYPERTENSION Disease Monitoring Blood pressure range - brings in wrist cuff which correlates with ours.  Reports usual readings are mid 140s over 70s Chest pain- No      Dyspnea- No Medications Compliance- brings all meds taking regularly Lightheadedness- No   Edema- at the end of a day  Arthritis Has pain on both knees and left shoulder.  Saw her orthopedist who diagnoses DJD and gave her mobic which helps some.  No rash or joint swellings or fever.   Tylenol does not help much with her joint pains.  Renal Insufficiency No dysuria or more UTI symptoms.  Does have dependent swelling that resolves when supine.   No nausea or vomiting  PMH Is not smoking.   Not able to Exercise much due to her sons brain tumor  Review of Systems see HPI     Objective:   Physical Exam    1+ pitting edema of legs     Assessment & Plan:

## 2010-08-12 NOTE — Assessment & Plan Note (Signed)
Fair control on multiple medications.  Home readings are reasonable.  Depending on her creatinine might need to stop hctz and substitute lasix

## 2010-08-12 NOTE — Assessment & Plan Note (Signed)
Worsened.  Stop mobic due to renal problems.  Recommend tylenol or ultram and weight loss.

## 2010-08-12 NOTE — Assessment & Plan Note (Signed)
Unchanged.  Stop Mobic that she was given for DJD.   Discussed weight loss as her most modifiable risk factor.  Check labs

## 2010-08-12 NOTE — Patient Instructions (Addendum)
Bring your BP cuff next visit Do not take any mobic or ibuprofen Only tylenol or ultram for pain Try to eat smaller portions to help lose weight I will call you if your lab tests are not normal.  Otherwise we will discuss them at your next visit.

## 2010-08-13 LAB — BASIC METABOLIC PANEL
BUN: 34 mg/dL — ABNORMAL HIGH (ref 6–23)
Chloride: 110 mEq/L (ref 96–112)
Creatinine, Ser: 1.65 mg/dL — ABNORMAL HIGH (ref 0.4–1.2)

## 2010-08-13 LAB — GLUCOSE, CAPILLARY
Glucose-Capillary: 134 mg/dL — ABNORMAL HIGH (ref 70–99)
Glucose-Capillary: 95 mg/dL (ref 70–99)

## 2010-08-13 LAB — CBC
MCHC: 33.9 g/dL (ref 30.0–36.0)
MCV: 85.6 fL (ref 78.0–100.0)
Platelets: 156 10*3/uL (ref 150–400)
WBC: 7.2 10*3/uL (ref 4.0–10.5)

## 2010-08-13 NOTE — Miscellaneous (Signed)
  Clinical Lists Changes Medical Records Request Records went to [facility] Date sent/faxed on 05/24/2009 [facility]  May 17, 2010 3:55 PM

## 2010-08-14 LAB — COMPREHENSIVE METABOLIC PANEL
ALT: 13 U/L (ref 0–35)
BUN: 44 mg/dL — ABNORMAL HIGH (ref 6–23)
CO2: 21 mEq/L (ref 19–32)
Calcium: 8.7 mg/dL (ref 8.4–10.5)
GFR calc non Af Amer: 23 mL/min — ABNORMAL LOW (ref >60)
Glucose, Bld: 186 mg/dL — ABNORMAL HIGH (ref 70–99)
Sodium: 142 mEq/L (ref 135–145)
Total Protein: 6.6 g/dL (ref 6.0–8.3)

## 2010-08-14 LAB — LACTIC ACID, PLASMA: Lactic Acid, Venous: 1.9 mmol/L (ref 0.5–2.2)

## 2010-08-14 LAB — ABO/RH: ABO/RH(D): A POS

## 2010-08-14 LAB — POCT I-STAT, CHEM 8
BUN: 43 mg/dL — ABNORMAL HIGH (ref 6–23)
Calcium, Ion: 1.16 mmol/L (ref 1.12–1.32)
Creatinine, Ser: 1.9 mg/dL — ABNORMAL HIGH (ref 0.4–1.2)
Hemoglobin: 11.9 g/dL — ABNORMAL LOW (ref 12.0–15.0)
Sodium: 144 mEq/L (ref 135–145)
TCO2: 22 mmol/L (ref 0–100)

## 2010-08-14 LAB — CBC
MCHC: 34.2 g/dL (ref 30.0–36.0)
MCV: 84.8 fL (ref 78.0–100.0)
RDW: 14.3 % (ref 11.5–15.5)
WBC: 8.8 10*3/uL (ref 4.0–10.5)

## 2010-08-14 LAB — PROTIME-INR
INR: 1.03 (ref 0.00–1.49)
Prothrombin Time: 13.4 seconds (ref 11.6–15.2)

## 2010-08-14 LAB — URINALYSIS, ROUTINE W REFLEX MICROSCOPIC
Glucose, UA: NEGATIVE mg/dL
Hgb urine dipstick: NEGATIVE
Ketones, ur: NEGATIVE mg/dL
Protein, ur: NEGATIVE mg/dL

## 2010-08-14 LAB — TYPE AND SCREEN
ABO/RH(D): A POS
Antibody Screen: NEGATIVE

## 2010-08-28 ENCOUNTER — Other Ambulatory Visit: Payer: Self-pay | Admitting: Family Medicine

## 2010-08-28 MED ORDER — ENALAPRIL MALEATE 20 MG PO TABS: 20.0000 mg | ORAL_TABLET | Freq: Two times a day (BID) | ORAL | Status: DC

## 2010-08-28 MED ORDER — ATENOLOL 25 MG PO TABS: 25.0000 mg | ORAL_TABLET | Freq: Two times a day (BID) | ORAL | Status: DC

## 2010-08-28 MED ORDER — HYDROCHLOROTHIAZIDE 50 MG PO TABS: 50.0000 mg | ORAL_TABLET | Freq: Every day | ORAL | Status: DC

## 2010-09-02 ENCOUNTER — Telehealth: Payer: Self-pay | Admitting: Family Medicine

## 2010-09-02 NOTE — Telephone Encounter (Signed)
Tina [name] called to ask why her Metformin was discontinued.  She was not aware of this and is upset because she said she needed to have it.  Do not remember of anything else being prescribed to replace it.  Please call pt back and advise.

## 2010-09-03 NOTE — Telephone Encounter (Signed)
Explained why had to stop metformin.   Will check labs at next visit

## 2010-09-11 ENCOUNTER — Encounter: Payer: Self-pay | Admitting: Family Medicine

## 2010-09-11 ENCOUNTER — Ambulatory Visit (INDEPENDENT_AMBULATORY_CARE_PROVIDER_SITE_OTHER): Payer: Medicare HMO | Admitting: Family Medicine

## 2010-09-11 VITALS — BP 176/83 | HR 76 | Temp 97.7°F | Ht 65.0 in | Wt 239.0 lb

## 2010-09-11 DIAGNOSIS — I10 Essential (primary) hypertension: Secondary | ICD-10-CM

## 2010-09-11 DIAGNOSIS — N183 Chronic kidney disease, stage 3 unspecified: Secondary | ICD-10-CM

## 2010-09-11 DIAGNOSIS — E669 Obesity, unspecified: Secondary | ICD-10-CM

## 2010-09-11 DIAGNOSIS — D6489 Other specified anemias: Secondary | ICD-10-CM

## 2010-09-11 DIAGNOSIS — M1990 Unspecified osteoarthritis, unspecified site: Secondary | ICD-10-CM

## 2010-09-11 DIAGNOSIS — E119 Type 2 diabetes mellitus without complications: Secondary | ICD-10-CM

## 2010-09-11 LAB — CBC
HCT: 33.5 % — ABNORMAL LOW (ref 36.0–46.0)
MCHC: 33.4 g/dL (ref 30.0–36.0)
MCV: 85 fL (ref 78.0–100.0)
Platelets: 204 10*3/uL (ref 150–400)
RDW: 14.1 % (ref 11.5–15.5)

## 2010-09-11 LAB — BASIC METABOLIC PANEL
BUN: 37 mg/dL — ABNORMAL HIGH (ref 6–23)
Creat: 1.73 mg/dL — ABNORMAL HIGH (ref 0.40–1.20)

## 2010-09-11 LAB — POCT GLYCOSYLATED HEMOGLOBIN (HGB A1C): Hemoglobin A1C: 7.4

## 2010-09-11 MED ORDER — HYDROCODONE-ACETAMINOPHEN 5-500 MG PO TABS: 1.0000 | ORAL_TABLET | Freq: Three times a day (TID) | ORAL | Status: DC | PRN

## 2010-09-11 NOTE — Progress Notes (Signed)
  Subjective:    Patient ID: Tina [name], female    DOB: Sep 07, 1940, 70 y.o.   MRN: [id number]  HPI  HYPERTENSION Disease Monitoring: Blood pressure range-her talking wrist meter is usually < 140/90 according to her  Chest pain, palpitations- no      Dyspnea- no  Medications: Compliance- takes regularly Lightheadedness,Syncope- no   Edema- no   DIABETES Disease Monitoring: Blood Sugar ranges-not checking Polyuria/phagia/dipsia- no      Visual problems- no but has not seen eye doctor  Medications: Compliance- stopped metformin as requested Hypoglycemic symptoms- no  Obesity Current BMI :   239 lb How long have been obese:  years Course:  Lost 10 lbs since last visit Problems it causes:  She is worried about her kidneys   Things have tried to improve:  Has decided to become a vegetarian!    ROS See HPI above   PMH Smoking Status noted         Review of Systems     Objective:   Physical Exam    Heart - Regular rate and rhythm.  No murmurs, gallops or rubs.    Diabetic Foot Check -  Appearance - no lesions, ulcers or calluses Skin - no unusual pallor or redness Sensation - grossly intact to light touch Pulses: Left - Dorsalis Pedis and Posterior Tibia normal Right - Dorsalis Pedis and Posterior Tibia normal Sensation to monofilament normal    Assessment & Plan:

## 2010-09-11 NOTE — Assessment & Plan Note (Signed)
Well controlled.  Continue current regimen.  Will need to watch a1c now she is off metformin

## 2010-09-11 NOTE — Assessment & Plan Note (Addendum)
Not well controlled today but home readings are by report.   Will have in write down.  Hopefully weight loss will help control.  Will see back in 6 weeks

## 2010-09-11 NOTE — Patient Instructions (Addendum)
Congratulations on the weight loss - you are off to a GREAT start  I will call you if your tests are not good.  Otherwise I will send you a letter.  If you do not hear from me with in 2 weeks please call our office.     Use the Vicodin for severe pain.  Use tylenol by itself when the pain is not too bad.  Write down your blood pressures and bring them in next visit

## 2010-09-11 NOTE — Assessment & Plan Note (Signed)
Worsened with pain in her Left shoulder.   Will try Vicodin for severe pain.

## 2010-09-11 NOTE — Assessment & Plan Note (Signed)
Improved!  Will have her come back in 6 weeks to recheck.

## 2010-10-14 ENCOUNTER — Encounter: Payer: Self-pay | Admitting: Family Medicine

## 2010-10-14 ENCOUNTER — Ambulatory Visit (INDEPENDENT_AMBULATORY_CARE_PROVIDER_SITE_OTHER): Payer: Medicare HMO | Admitting: Family Medicine

## 2010-10-14 DIAGNOSIS — M1990 Unspecified osteoarthritis, unspecified site: Secondary | ICD-10-CM

## 2010-10-14 DIAGNOSIS — E119 Type 2 diabetes mellitus without complications: Secondary | ICD-10-CM

## 2010-10-14 DIAGNOSIS — I10 Essential (primary) hypertension: Secondary | ICD-10-CM

## 2010-10-14 DIAGNOSIS — E669 Obesity, unspecified: Secondary | ICD-10-CM

## 2010-10-14 MED ORDER — CYCLOBENZAPRINE HCL 10 MG PO TABS: 5.0000 mg | ORAL_TABLET | Freq: Every evening | ORAL | Status: DC | PRN

## 2010-10-14 NOTE — Progress Notes (Signed)
  Subjective:    Patient ID: Tina [name], female    DOB: 1940/08/21, 70 y.o.   MRN: [id number]  HPI Diabetes Disease Monitoring: Blood Sugar ranges: none over 200 some a slightly low  Visual Problems- no Medication: Compliance- taking all her medications Self monitoring:  Patient sporadically takes her blood sugar.  Reports not knowing how to use machine. Son can help when available.    Obesity Pt has lost 15 pounds since 10/11.  Patient has decided to become a vegetarian and focuses on eating fresh vegetables daily.  Patient reports no regular exercise routine.  Patient Identified Concern: Patient is highly concerned about her kidney failure.  Stage of Change Patient Is In:  Patient is in the preparation stage (has been making changes less than 6 months) Patient Reported Barriers: Does not like to exercise, lazy, pain in arm and leg. Patient Reported Perceived Benefits: personal health, avoid problems with her kidneys Patient Reports Self-Efficacy:  Patient reports low self-efficacy and low internal locus of control Behavior Change Supports: To be identified  Hypertension Disease Monitoring  Blood pressure range-checks daily   Chest pain- no  Dyspnea- no Medications: Compliance, pt reports taking medicine daily  Lightheadedness- no  Edema- no change in chronic  Review of Symptoms - see HPI  PMH - Smoking status noted.     Review of Systems     Objective:   Physical Exam    No acute distress.    Assessment & Plan:

## 2010-10-14 NOTE — Assessment & Plan Note (Addendum)
Continued improvement!  Lost 4 lbs.   Continue to focus on eating fresh fruits and vegetables. Begin to move more by doing chair exercises every day for 10 minutes.   This is key to her diabetes and musculoskeletal problems

## 2010-10-14 NOTE — Assessment & Plan Note (Signed)
Continued pain in her shoulder.  Will try prn QHS flexeril.   Do not feel percocet are appropriate although she feels these are weaker than Vicodin previously prescribed

## 2010-10-14 NOTE — Assessment & Plan Note (Addendum)
Continued counseling for weight loss and exercise.  Continue with vegetarian diet and focusing on fresh fruits and vegetables.  Follow up appointment in 2 months for A1C check.   Goals:  Chair exercises 10 minutes a day.  Patient Education: Patient established goals and discussed strategies to incorporate more movement into her daily life.   No signs of significant hyperglycemia since she is off metformin.  Hopefully weight loss and exercise will keep under control.  Check A1c next visit

## 2010-10-14 NOTE — Patient Instructions (Signed)
1. 10 minutes of chair exercises a day during [employer]- focus on your legs. 2. Tina [name] will call on Monday's at 2:30 pm to follow up with you. 3. Continue to work on losing weight- focus on eating vegetables and whole grains 4. Keep monitoring and record of blood pressure and blood sugar. 5. Schedule follow up appointment for beginning of August 2012. 6. Bring blood sugar machine and medicines next time you come in.   [place] job on losing weight!  You have lost 15 pounds since October 2011.

## 2010-10-14 NOTE — Assessment & Plan Note (Addendum)
Well controlled today, brought in home readings which were within normal range.   Continued home monitoring and bring in log at next visit.   Continued counseling on weight loss and exercise.

## 2010-10-17 ENCOUNTER — Other Ambulatory Visit: Payer: Self-pay | Admitting: Family Medicine

## 2010-10-17 MED ORDER — ONDANSETRON HCL 4 MG PO TABS: 4.0000 mg | ORAL_TABLET | Freq: Four times a day (QID) | ORAL | Status: DC | PRN

## 2010-11-08 ENCOUNTER — Telehealth: Payer: Self-pay | Admitting: Home Health Services

## 2010-11-08 NOTE — Telephone Encounter (Signed)
Spoke with Tina [name]   Pt reported doing chair exercises 10 minutes everyday. Pt feeling a little bit better but that her legs still feel week and her should hurts.  Pt also reported that she is taking her blood sugar every day but not writing it down.  Encouraged her to keep a record and to bring it in next time she is in office.   Overall goal with patient is to get more natural movement in her daily life and to lose weight.  Pt decided to keep goals of chair exercises every day and I will touch base with her next Thursday 11/14/10 for accountability.  Pt reported doing well on vegetarian diet.

## 2010-12-02 ENCOUNTER — Ambulatory Visit (INDEPENDENT_AMBULATORY_CARE_PROVIDER_SITE_OTHER): Payer: Medicare HMO

## 2010-12-02 ENCOUNTER — Inpatient Hospital Stay (INDEPENDENT_AMBULATORY_CARE_PROVIDER_SITE_OTHER)
Admission: RE | Admit: 2010-12-02 | Discharge: 2010-12-02 | Disposition: A | Discharge status: Home / Self Care | Payer: Medicare HMO | Source: Ambulatory Visit | Attending: Family Medicine | Admitting: Family Medicine

## 2010-12-02 DIAGNOSIS — M75100 Unspecified rotator cuff tear or rupture of unspecified shoulder, not specified as traumatic: Secondary | ICD-10-CM

## 2010-12-04 ENCOUNTER — Other Ambulatory Visit: Payer: Self-pay | Admitting: Family Medicine

## 2010-12-04 MED ORDER — ONDANSETRON HCL 4 MG PO TABS: 4.0000 mg | ORAL_TABLET | Freq: Four times a day (QID) | ORAL | Status: DC | PRN

## 2010-12-05 ENCOUNTER — Other Ambulatory Visit: Payer: Self-pay | Admitting: Family Medicine

## 2010-12-05 NOTE — Telephone Encounter (Signed)
Refill request

## 2010-12-11 ENCOUNTER — Encounter: Payer: Self-pay | Admitting: Family Medicine

## 2010-12-11 ENCOUNTER — Ambulatory Visit (INDEPENDENT_AMBULATORY_CARE_PROVIDER_SITE_OTHER): Payer: Medicare HMO | Admitting: Family Medicine

## 2010-12-11 VITALS — BP 170/84 | HR 67 | Temp 98.7°F | Ht 65.0 in | Wt 237.4 lb

## 2010-12-11 DIAGNOSIS — E119 Type 2 diabetes mellitus without complications: Secondary | ICD-10-CM

## 2010-12-11 DIAGNOSIS — L84 Corns and callosities: Secondary | ICD-10-CM | POA: Insufficient documentation

## 2010-12-11 LAB — POCT GLYCOSYLATED HEMOGLOBIN (HGB A1C): Hemoglobin A1C: 12.7

## 2010-12-11 NOTE — Progress Notes (Signed)
  Subjective:    Patient ID: Tina [name], female    DOB: 20-Mar-1941, 70 y.o.   MRN: [id number]  HPI  Lesion on R heel Just discovered this AM.  Feels like something stuck on.  No pain or discharge or redness or trauma or fever.  Unsure how long has been there.  Concerned could be an infection.  Does not wear diabetes shoes all the time.  Fell this AM Tripped on carpet.  Did not injury anything or strike her head or have chest pain or weakness  DIABETES Disease Monitoring: Blood Sugar ranges-all less than 200 checks almost every AM Polyuria/phagia/dipsia- no      Visual problems- no  Medications: Compliance- taking all her meds but did not bring them in today Hypoglycemic symptoms- no  Review of Symptoms - see HPI  SH - her son now has lung cancer she is very concerned about him    Review of Systems     Objective:   Physical Exam  Obese in NAD.  Normal mentation R foot - heel - thickened circular callus about quarter size.  Easily peeled off with normal skin beneath without redness or discharge.   No other skin breakdown on large calluses on either foot      Assessment & Plan:

## 2010-12-11 NOTE — Patient Instructions (Signed)
Come back in 2-4 weeks  Bring in all your medicines and your blood sugar readings and your blood pressure readings  Use vaseline on your feet and heels Twice daily and watch carefully for sores.  Wear only your diabetic shoes  Call if your blood sugars in the AM are > 200  You should see your eye doctor to chck for diabetes

## 2010-12-11 NOTE — Assessment & Plan Note (Signed)
Poorly controlled by her A1c but this is unexpected and she reports good blood sugars.  She did not bring her medications so I can not confirm her report of no med changes.  She could be missing one or more of her medicines.  Will see her back soon

## 2010-12-11 NOTE — Assessment & Plan Note (Signed)
On R heel.  Likely due to footwear rubbing.  Asked to lubricate and to wear her diabetic shoes and to watch carefully

## 2010-12-24 ENCOUNTER — Telehealth: Payer: Self-pay | Admitting: Family Medicine

## 2010-12-24 NOTE — Telephone Encounter (Signed)
Want to have an additional med for her diabetes.  Tested blood sugar reading this am was 400.  Please call patient asap

## 2010-12-25 NOTE — Telephone Encounter (Signed)
Pt calling back to say she never received a call from primary regarding elevation of blood sugar.  Please call her asap.  She is quite concerned about this.  Was taken off her Metformin, but is having more issues with the elevation since this has occured

## 2010-12-25 NOTE — Telephone Encounter (Signed)
Called - left message that I will call again in the AM  If she feel she needs to be seen she can come in as a work in.  BRING ALL HER MED BOTTLES

## 2010-12-26 MED ORDER — GLIMEPIRIDE 2 MG PO TABS: 2.0000 mg | ORAL_TABLET | Freq: Two times a day (BID) | ORAL | Status: DC

## 2010-12-26 NOTE — Telephone Encounter (Signed)
Spoke with her feels well except for high bs in the 200s Asked to increase glimerpide to bid

## 2010-12-30 ENCOUNTER — Ambulatory Visit (INDEPENDENT_AMBULATORY_CARE_PROVIDER_SITE_OTHER): Payer: Medicare HMO | Admitting: Family Medicine

## 2010-12-30 ENCOUNTER — Encounter: Payer: Self-pay | Admitting: Family Medicine

## 2010-12-30 VITALS — BP 152/86 | HR 68 | Temp 97.7°F | Wt 234.0 lb

## 2010-12-30 DIAGNOSIS — M1990 Unspecified osteoarthritis, unspecified site: Secondary | ICD-10-CM

## 2010-12-30 DIAGNOSIS — E782 Mixed hyperlipidemia: Secondary | ICD-10-CM

## 2010-12-30 DIAGNOSIS — E119 Type 2 diabetes mellitus without complications: Secondary | ICD-10-CM

## 2010-12-30 DIAGNOSIS — I10 Essential (primary) hypertension: Secondary | ICD-10-CM

## 2010-12-30 MED ORDER — SITAGLIPTIN PHOSPHATE 50 MG PO TABS: 50.0000 mg | ORAL_TABLET | Freq: Every day | ORAL | Status: DC

## 2010-12-30 MED ORDER — HYDROCODONE-ACETAMINOPHEN 5-500 MG PO TABS: 1.0000 | ORAL_TABLET | Freq: Four times a day (QID) | ORAL | Status: DC | PRN

## 2010-12-30 NOTE — Patient Instructions (Signed)
We started a new medicine - take one a day  Come back in 3 weeks - bring in your blood sugar readings and your BP METER.  Call if any problems.  Keep up the weight loss!!

## 2010-12-30 NOTE — Assessment & Plan Note (Signed)
Not at goal here but home readings are good although with a wrist cuff.  Ask her to bring in.  Continue current meds

## 2010-12-30 NOTE — Progress Notes (Signed)
  Subjective:    Patient ID: Tina [name], female    DOB: November 27, 1940, 70 y.o.   MRN: [id number]  HPI  HYPERTENSION Disease Monitoring: Blood pressure range-98-130/60-72 with wrist cuff Chest pain, palpitations- no      Dyspnea- no  Medications: Compliance- brings in all med bottles which match our list Lightheadedness,Syncope- no   Edema- no   DIABETES Disease Monitoring: Blood Sugar ranges-129-390 all fasting? Polyuria/phagia/dipsia- no      Visual problems- no  Medications: Compliance- twice daily glipizide Hypoglycemic symptoms-no  Feet Rough areas are much better  ROS See HPI above   PMH Smoking Status noted       Review of Systems     Objective:   Physical Exam   Feet Heels so very slight callus without skin breakdown or open lesions  Good foot care      Assessment & Plan:

## 2010-12-30 NOTE — Assessment & Plan Note (Signed)
Not well controlled.  Add Januvia.  She is very reluctant to take insulin and her creatinine makes metformin somewhat risky although this might be an option at low dose Encourage continued weight loss

## 2010-12-31 LAB — LIPID PANEL
Cholesterol: 172 mg/dL (ref 0–200)
Triglycerides: 277 mg/dL — ABNORMAL HIGH (ref <150)

## 2011-01-01 ENCOUNTER — Other Ambulatory Visit: Payer: Self-pay | Admitting: Family Medicine

## 2011-01-01 ENCOUNTER — Ambulatory Visit: Payer: Medicare HMO | Admitting: Family Medicine

## 2011-01-01 MED ORDER — GLIMEPIRIDE 2 MG PO TABS: 2.0000 mg | ORAL_TABLET | Freq: Two times a day (BID) | ORAL | Status: DC

## 2011-01-27 ENCOUNTER — Ambulatory Visit (INDEPENDENT_AMBULATORY_CARE_PROVIDER_SITE_OTHER): Payer: Medicare HMO | Admitting: Family Medicine

## 2011-01-27 ENCOUNTER — Encounter: Payer: Self-pay | Admitting: Family Medicine

## 2011-01-27 VITALS — BP 148/78 | HR 76 | Temp 98.6°F | Ht 65.0 in | Wt 239.1 lb

## 2011-01-27 DIAGNOSIS — M25519 Pain in unspecified shoulder: Secondary | ICD-10-CM

## 2011-01-27 DIAGNOSIS — E119 Type 2 diabetes mellitus without complications: Secondary | ICD-10-CM

## 2011-01-27 DIAGNOSIS — Z23 Encounter for immunization: Secondary | ICD-10-CM

## 2011-01-27 NOTE — Assessment & Plan Note (Signed)
Unsure of control but will continue Januvia and check A1c in 2 months

## 2011-01-27 NOTE — Assessment & Plan Note (Signed)
Likely bursitis does not seem to be due to cervical impingement.  Had recent injection.  She does not have time for PT with her sons illness.  Taught ROM and to use vicodin PRN for severe pain

## 2011-01-27 NOTE — Patient Instructions (Addendum)
Your cholesterol is good  Keep taking all your exactly as written  You need to continue to lose weight  Do shoulder exercises - walk the wall and move your shoulder in 3 directions at least three times daily  See Rosalita Chessman in 1 month and me back in 2 months  I hope your son does well

## 2011-01-27 NOTE — Progress Notes (Signed)
  Subjective:    Patient ID: Tina [name], female    DOB: Jan 16, 1941, 70 y.o.   MRN: [id number]  HPI  DIABETES Disease Monitoring: Blood Sugar ranges-most are below 200 but she forgot her meter Polyuria/phagia/dipsia- no      Visual problems- no  Medications: Compliance- taking all her meds (brough in) Hypoglycemic symptoms- no  JOINT PAIN Location: Left Shoulder.   Course:  Has had for several months.  Had it injected by Dr Madilyn Fireman one month ago did not help much Never had PT Worse with:  movement Better with:  rest Trauma: no Swelling:  no Locking:  no Other Joints involved:  Elbow down to wrist Rash: no Fever:  No Pain does not change with neck movement.  No loss of sensation or strength  Review of Symptoms - see HPI  PMH - Smoking status noted.       Review of Systems     Objective:   Physical Exam  Shoulder - Left Deceased ROM in all 3 movements.  Distal strength and pulses intact Tender diffusely with palpation and movent around shoulder and elbow but has FROM at elbow and wrist. Neck - FROM without change in pain        Assessment & Plan:

## 2011-01-30 LAB — POCT URINALYSIS DIP (DEVICE)
Glucose, UA: 500 — AB
Nitrite: NEGATIVE
Urobilinogen, UA: 1

## 2011-02-11 LAB — POCT URINALYSIS DIP (DEVICE)
Bilirubin Urine: NEGATIVE
Nitrite: NEGATIVE
Protein, ur: NEGATIVE mg/dL
pH: 5 (ref 5.0–8.0)

## 2011-02-24 ENCOUNTER — Other Ambulatory Visit: Payer: Self-pay | Admitting: Family Medicine

## 2011-02-24 ENCOUNTER — Ambulatory Visit: Payer: Medicare HMO | Admitting: Home Health Services

## 2011-02-24 LAB — POCT URINALYSIS DIP (DEVICE)
Nitrite: NEGATIVE
pH: 5.5

## 2011-02-24 NOTE — Telephone Encounter (Signed)
Refill request

## 2011-02-27 ENCOUNTER — Other Ambulatory Visit: Payer: Self-pay | Admitting: Family Medicine

## 2011-02-27 DIAGNOSIS — E119 Type 2 diabetes mellitus without complications: Secondary | ICD-10-CM

## 2011-02-27 MED ORDER — SITAGLIPTIN PHOSPHATE 50 MG PO TABS: 50.0000 mg | ORAL_TABLET | Freq: Every day | ORAL | Status: DC

## 2011-03-03 ENCOUNTER — Ambulatory Visit: Payer: Medicare HMO | Admitting: Home Health Services

## 2011-03-10 ENCOUNTER — Ambulatory Visit: Payer: Medicare HMO | Admitting: Home Health Services

## 2011-03-10 ENCOUNTER — Ambulatory Visit: Payer: Medicare HMO | Admitting: Family Medicine

## 2011-03-10 ENCOUNTER — Telehealth: Payer: Self-pay | Admitting: Family Medicine

## 2011-03-10 NOTE — Telephone Encounter (Signed)
Ms. Donovan need you to call back to resched appt

## 2011-03-11 NOTE — Telephone Encounter (Signed)
Called and rescheduled for 11/9 3 pm

## 2011-03-21 ENCOUNTER — Ambulatory Visit (INDEPENDENT_AMBULATORY_CARE_PROVIDER_SITE_OTHER): Payer: Medicare HMO | Admitting: Family Medicine

## 2011-03-21 ENCOUNTER — Encounter: Payer: Self-pay | Admitting: Family Medicine

## 2011-03-21 VITALS — BP 137/82 | HR 69 | Temp 98.1°F | Ht 65.0 in | Wt 242.4 lb

## 2011-03-21 DIAGNOSIS — E669 Obesity, unspecified: Secondary | ICD-10-CM

## 2011-03-21 DIAGNOSIS — M1990 Unspecified osteoarthritis, unspecified site: Secondary | ICD-10-CM

## 2011-03-21 DIAGNOSIS — E119 Type 2 diabetes mellitus without complications: Secondary | ICD-10-CM

## 2011-03-21 MED ORDER — DICLOFENAC SODIUM 1 % TD GEL: 1.0000 "application " | Freq: Four times a day (QID) | TRANSDERMAL | Status: DC

## 2011-03-21 NOTE — Patient Instructions (Addendum)
1. Increase your walking everyday in your home. 2. Continue to take your medications daily. 3. Continue to eat 4-5 vegetables a day and limit cereal. 4. Continue to give service to others. 5. Follow up appointment on Monday April 21, 2011 at 3:00 pm.

## 2011-03-21 NOTE — Assessment & Plan Note (Signed)
Assessment:  Patient has gained 8 lbs over last 6 months.  Pt doesn't have a great sense of how she gains or loses weight. Pain and her diabetes seem to be greater motivators for improving her health.     Plan: Continue to focus on eating 4-5 vegetables a day, limiting cereals, increase daily movement by walking more in her home.  Continued counseling with health educator.

## 2011-03-21 NOTE — Progress Notes (Signed)
  Subjective:    Patient ID: Tina [name], female    DOB: 06/15/1940, 70 y.o.   MRN: [id number]  HPI  DIABETES Disease Monitoring: Blood Sugar ranges-100-200s Polyuria/phagia/dipsia- no      Visual problems- no  Medications: Compliance- did not bring in but thinks is taking regularly Hypoglycemic symptoms- no  OBESITY  Current weight/BMI :  242.4 lbs 40.34 BMI How long have been obese:  10+ years Course:  Weight slowly increasing Problems or symptoms it causes:  DM, HTN, joint pain   Things have tried to improve:  Portion reduction/diet changes  Patient Identified Concern:  Pain, diabetes control Stage of Change Patient Is In: contemplation (pt thinking about making changes in next 6 months) Patient Reported Barriers:  Busy helping others, pain, cold weather Patient Reported Perceived Benefits:  Feeling better Patient Reports Self-Efficacy:   Pt displays some self-efficacy but really lacks the knowledge and motivation Behavior Change Supports:  To be identified Goals:  Increase movement, walking more in her home.  Patient Education:  We discussed foods that raise blood sugar including cereals.  We also talked about diet/exercise as ways to manage diabetes.  We discussed what brings her happiness and she identified her cat and helping others as bringing joy in her life.   We discussed scheduling her mammogram.  Pt indicated that she would schedule at [facility].  Pt not interested in getting colonoscopy.    Review of Systems     Objective:   Physical Exam  No acute distress.      Assessment & Plan:

## 2011-03-22 LAB — BASIC METABOLIC PANEL
BUN: 32 mg/dL — ABNORMAL HIGH (ref 6–23)
Creat: 2.07 mg/dL — ABNORMAL HIGH (ref 0.50–1.10)

## 2011-03-24 NOTE — Assessment & Plan Note (Signed)
Lab Results  Component Value Date   HGBA1C 10.8 03/21/2011   HGBA1C 7.0 06/03/2010   CREATININE 2.07* 03/21/2011   CREATININE 1.97* 01/10/2010   CHOL 172 12/30/2010   HDL 30* 12/30/2010   TRIG 277* 12/30/2010    Last eye exam and foot exam: No results found for this basename: HMDIABEYEEXA, HMDIABFOOTEX    Assessment: Diabetes control: improving some Progress toward goals: improved Barriers to meeting goals: lack of understanding of disease management  Plan: Diabetes treatment: check labs may need to add insulin.  Discussed with her today Refer to: none Instruction/counseling given: other instruction/counseling: bring in her meds

## 2011-04-21 ENCOUNTER — Ambulatory Visit: Payer: Medicare HMO | Admitting: Family Medicine

## 2011-05-19 ENCOUNTER — Ambulatory Visit: Payer: Medicare HMO | Admitting: Family Medicine

## 2011-05-22 ENCOUNTER — Other Ambulatory Visit: Payer: Self-pay | Admitting: Family Medicine

## 2011-05-22 NOTE — Telephone Encounter (Signed)
Refill request

## 2011-06-02 ENCOUNTER — Ambulatory Visit (INDEPENDENT_AMBULATORY_CARE_PROVIDER_SITE_OTHER): Payer: Medicare HMO | Admitting: Family Medicine

## 2011-06-02 ENCOUNTER — Encounter: Payer: Self-pay | Admitting: Family Medicine

## 2011-06-02 VITALS — BP 130/74 | HR 76 | Ht 65.0 in | Wt 251.0 lb

## 2011-06-02 DIAGNOSIS — I10 Essential (primary) hypertension: Secondary | ICD-10-CM

## 2011-06-02 DIAGNOSIS — E118 Type 2 diabetes mellitus with unspecified complications: Secondary | ICD-10-CM

## 2011-06-02 DIAGNOSIS — E119 Type 2 diabetes mellitus without complications: Secondary | ICD-10-CM

## 2011-06-02 LAB — POCT GLYCOSYLATED HEMOGLOBIN (HGB A1C): Hemoglobin A1C: 12.2

## 2011-06-02 NOTE — Patient Instructions (Signed)
Diabetes Appointment to see Dr Raymondo Band to learn to use insulin Bring in all your blood sugar readings   Hypertension  Write down you blood pressure and bring in the readings   Kidneys Do not take advil or nuprin or any aspirin medications   Make an appointment to see your Eye Doctor

## 2011-06-02 NOTE — Progress Notes (Signed)
  Subjective:    Patient ID: Mauricia Area, female    DOB: November 02, 1940, 71 y.o.   MRN: [id number]  HPI  DIABETES Disease Monitoring: Blood Sugar ranges-always > 200  Polyuria/phagia/dipsia- no      Visual problems- no  Medications: Compliance- brings all medications and reports taking regularly Hypoglycemic symptoms- no  HYPERTENSION Disease Monitoring Home BP Monitoring usually well controlled in 130/80s with her own cuff. Chest pain- no     Dyspnea-  no  Medications Compliance: taking as prescribed. Lightheadedness-  no  Edema-  no   ROS - See HPI  PMH Lab Review   Potassium  Date Value Range Status  03/21/2011 4.6  3.5-5.3 (mEq/L) Final     Sodium  Date Value Range Status  03/21/2011 139  135-145 (mEq/L) Final       Review of Symptoms - see HPI  PMH - Smoking status noted.   SH - several deaths in the family the last month had to travel to [facility] several times  Review of Systems     Objective:   Physical Exam no apparent distress Heart - Regular rate and rhythm.  No murmurs, gallops or rubs.            Assessment & Plan:

## 2011-06-02 NOTE — Assessment & Plan Note (Signed)
Not well controlled.  Will need to start insulin.  She reluctantly agrees.   Schedule with pharmacy.   Like stop Januvia?

## 2011-06-02 NOTE — Assessment & Plan Note (Signed)
Controlled by home readings and repeat in office.  Continue current medications.  Will need bmet next visit

## 2011-06-10 ENCOUNTER — Ambulatory Visit: Payer: Medicare HMO | Admitting: Pharmacist

## 2011-06-13 DIAGNOSIS — I639 Cerebral infarction, unspecified: Secondary | ICD-10-CM

## 2011-06-13 HISTORY — DX: Cerebral infarction, unspecified: I63.9

## 2011-06-17 ENCOUNTER — Ambulatory Visit (INDEPENDENT_AMBULATORY_CARE_PROVIDER_SITE_OTHER): Payer: Medicare HMO | Admitting: Pharmacist

## 2011-06-17 VITALS — BP 136/74 | HR 71 | Ht 65.0 in | Wt 252.3 lb

## 2011-06-17 DIAGNOSIS — E118 Type 2 diabetes mellitus with unspecified complications: Secondary | ICD-10-CM

## 2011-06-17 NOTE — Patient Instructions (Signed)
It was nice to meet you!  1) Start to test your blood sugar twice daily and write these numbers down. 2) Start taking 2 of your glimepiride 2mg  tablets (4mg  total) daily with breakfast. 3) Start trying to walk more often around [place].    Return to the pharmacy clinic with Dr. Raymondo Band in about 2 weeks. Bring your blood sugar log-book with you.

## 2011-06-17 NOTE — Progress Notes (Signed)
  Subjective:    Patient ID: Tina [name], female    DOB: 03/21/1941, 71 y.o.   MRN: [id number]  HPI  Tina [name] is a 71yoF that presented to the pharmacy clinic for diabetes management in a very depressed mood. She was very anxious about the idea of having to start injections/insulin and reports being stressed since her son has cancer. She is currently taking Januvia (sitagliptan) 50mg  daily and glimepiride 2mg  every morning (was prescribed BID); had previously taken metformin which was discontinued due to renal dysfunction. She has not been testing her blood glucose due to running out of lancets. She reports that she gets some enjoyment out of walking around [place] with her grocery-cart to help keep her steady on her feet. We encouraged her to try to increase her exercise by walking more often.  Discussed addition of venlafaxine at next visit with Dr. Deirdre Priest.    Review of Systems     Objective:   Physical Exam Filed Vitals:   06/17/11 0934  BP: 136/74  Pulse: 71   Filed Vitals:   06/17/11 0934  Height: 5\' 5"  (1.651 m)  Weight: 252 lb 4.8 oz (114.443 kg)       Assessment & Plan:   Tina [name] is a 71yoF with Type 2 Diabetes currently under poor control of blood glucose based on  . Lab Results  Component Value Date   HGBA1C 12.2 06/02/2011  Patient has not been testing BG due to running out of lancets. A new lancing device and 2 boxes of lancets was provided to the patient. Blood glucose control is sub-optimal due to dietary indiscretions, lack of exercise, and likely medication non-adherance. Denies hypoglycemic events. Patient was instructed to take glimepiride 4mg  with breakfast daily and to test blood glucose twice daily and document in a written log-book, and to try to increase walking.   Written patient instructions provided.  Follow up in  Pharmacist Clinic Visit 2 weeks.   Total time in face to face counseling 60 minutes.  Patient seen with Su Hilt,  PharmD Candidate and Benjaman Pott, Pharmacy Resident.  Consider use of venlafaxine (Effexor) at next visit.  Marland Kitchen

## 2011-06-17 NOTE — Assessment & Plan Note (Signed)
Tina [name] is a 71yoF with Type 2 Diabetes currently under poor control of blood glucose based on  . Lab Results  Component Value Date   HGBA1C 12.2 06/02/2011  Patient has not been testing BG due to running out of lancets. A new lancing device and 2 boxes of lancets was provided to the patient. Blood glucose control is sub-optimal due to dietary indiscretions, lack of exercise, and likely medication non-adherance. Denies hypoglycemic events. Patient was instructed to take glimepiride 4mg  with breakfast daily and to test blood glucose twice daily and document in a written log-book, and to try to increase walking.   Written patient instructions provided.  Follow up in  Pharmacist Clinic Visit 2 weeks.   Total time in face to face counseling 60 minutes.  Patient seen with Su Hilt, PharmD Candidate and Benjaman Pott, Pharmacy Resident.

## 2011-06-17 NOTE — Progress Notes (Signed)
  Subjective:    Patient ID: Tina [name], female    DOB: 02/02/1941, 71 y.o.   MRN: [id number]  HPI Reviewed and agree with Dr. Koval's management.    Review of Systems     Objective:   Physical Exam        Assessment & Plan:   

## 2011-06-19 ENCOUNTER — Telehealth: Payer: Self-pay | Admitting: *Deleted

## 2011-06-19 NOTE — Telephone Encounter (Signed)
Message left on office voicemail to call back.  Returned call to [employer] (Financial risk analyst) at [facility].  Patient had 1st home visit scheduled for today. Patient rescheduled appt for 06/24/11 @ 3:00 pm due to son having cancer and patient did not want strangers at her house.  At home visit next week, Selena Batten will reinforce patient taking diabetic meds and checking CBGs.  Will inform Dr. Deirdre Priest.  Gaylene Brooks, RN

## 2011-06-23 ENCOUNTER — Encounter (HOSPITAL_COMMUNITY): Payer: Self-pay | Admitting: Emergency Medicine

## 2011-06-23 ENCOUNTER — Inpatient Hospital Stay (HOSPITAL_COMMUNITY): Payer: Medicare HMO

## 2011-06-23 ENCOUNTER — Inpatient Hospital Stay (HOSPITAL_COMMUNITY)
Admission: EM | Admit: 2011-06-23 | Discharge: 2011-06-26 | DRG: 066 | Disposition: A | Discharge status: Home / Self Care | Payer: Medicare HMO | Attending: Family Medicine | Admitting: Family Medicine

## 2011-06-23 ENCOUNTER — Other Ambulatory Visit: Payer: Self-pay

## 2011-06-23 ENCOUNTER — Emergency Department (HOSPITAL_COMMUNITY): Payer: Medicare HMO

## 2011-06-23 DIAGNOSIS — K219 Gastro-esophageal reflux disease without esophagitis: Secondary | ICD-10-CM | POA: Diagnosis present

## 2011-06-23 DIAGNOSIS — E669 Obesity, unspecified: Secondary | ICD-10-CM | POA: Diagnosis present

## 2011-06-23 DIAGNOSIS — I129 Hypertensive chronic kidney disease with stage 1 through stage 4 chronic kidney disease, or unspecified chronic kidney disease: Secondary | ICD-10-CM | POA: Diagnosis present

## 2011-06-23 DIAGNOSIS — Z794 Long term (current) use of insulin: Secondary | ICD-10-CM

## 2011-06-23 DIAGNOSIS — E118 Type 2 diabetes mellitus with unspecified complications: Secondary | ICD-10-CM

## 2011-06-23 DIAGNOSIS — F341 Dysthymic disorder: Secondary | ICD-10-CM | POA: Diagnosis present

## 2011-06-23 DIAGNOSIS — E119 Type 2 diabetes mellitus without complications: Secondary | ICD-10-CM | POA: Diagnosis present

## 2011-06-23 DIAGNOSIS — Z87891 Personal history of nicotine dependence: Secondary | ICD-10-CM

## 2011-06-23 DIAGNOSIS — R42 Dizziness and giddiness: Secondary | ICD-10-CM

## 2011-06-23 DIAGNOSIS — Z8249 Family history of ischemic heart disease and other diseases of the circulatory system: Secondary | ICD-10-CM

## 2011-06-23 DIAGNOSIS — Z7982 Long term (current) use of aspirin: Secondary | ICD-10-CM

## 2011-06-23 DIAGNOSIS — E785 Hyperlipidemia, unspecified: Secondary | ICD-10-CM | POA: Diagnosis present

## 2011-06-23 DIAGNOSIS — Z833 Family history of diabetes mellitus: Secondary | ICD-10-CM

## 2011-06-23 DIAGNOSIS — Z79899 Other long term (current) drug therapy: Secondary | ICD-10-CM

## 2011-06-23 DIAGNOSIS — N183 Chronic kidney disease, stage 3 unspecified: Secondary | ICD-10-CM | POA: Diagnosis present

## 2011-06-23 DIAGNOSIS — I639 Cerebral infarction, unspecified: Secondary | ICD-10-CM

## 2011-06-23 DIAGNOSIS — I6350 Cerebral infarction due to unspecified occlusion or stenosis of unspecified cerebral artery: Principal | ICD-10-CM | POA: Diagnosis present

## 2011-06-23 HISTORY — DX: Major depressive disorder, single episode, unspecified: F32.9

## 2011-06-23 HISTORY — DX: Cardiac murmur, unspecified: R01.1

## 2011-06-23 HISTORY — DX: Essential (primary) hypertension: I10

## 2011-06-23 HISTORY — DX: Cerebral infarction, unspecified: I63.9

## 2011-06-23 HISTORY — DX: Chronic kidney disease, unspecified: N18.9

## 2011-06-23 HISTORY — DX: Depression, unspecified: F32.A

## 2011-06-23 LAB — CBC
HCT: 33.3 % — ABNORMAL LOW (ref 36.0–46.0)
HCT: 34.4 % — ABNORMAL LOW (ref 36.0–46.0)
Hemoglobin: 11.3 g/dL — ABNORMAL LOW (ref 12.0–15.0)
Hemoglobin: 11.6 g/dL — ABNORMAL LOW (ref 12.0–15.0)
MCH: 28.5 pg (ref 26.0–34.0)
MCHC: 33.9 g/dL (ref 30.0–36.0)
MCHC: 33.7 g/dL (ref 30.0–36.0)
MCV: 83.7 fL (ref 78.0–100.0)
RDW: 13.3 % (ref 11.5–15.5)

## 2011-06-23 LAB — COMPREHENSIVE METABOLIC PANEL
ALT: 11 U/L (ref 0–35)
AST: 15 U/L (ref 0–37)
Calcium: 8.5 mg/dL (ref 8.4–10.5)
GFR calc Af Amer: 31 mL/min — ABNORMAL LOW (ref >90)
Sodium: 139 mEq/L (ref 135–145)
Total Protein: 6.4 g/dL (ref 6.0–8.3)

## 2011-06-23 LAB — DIFFERENTIAL
Basophils Relative: 0 % (ref 0–1)
Eosinophils Absolute: 0.1 10*3/uL (ref 0.0–0.7)
Lymphocytes Relative: 22 % (ref 12–46)
Neutro Abs: 5.2 10*3/uL (ref 1.7–7.7)

## 2011-06-23 LAB — POCT I-STAT TROPONIN I

## 2011-06-23 LAB — GLUCOSE, CAPILLARY: Glucose-Capillary: 209 mg/dL — ABNORMAL HIGH (ref 70–99)

## 2011-06-23 LAB — CREATININE, SERUM: GFR calc non Af Amer: 28 mL/min — ABNORMAL LOW (ref >90)

## 2011-06-23 MED ORDER — SODIUM CHLORIDE 0.9 % IV SOLN: 250.0000 mL | INTRAVENOUS | Status: DC | PRN

## 2011-06-23 MED ORDER — ASPIRIN 325 MG PO TABS
325.0000 mg | ORAL_TABLET | Freq: Every day | ORAL | Status: DC
  Administered 2011-06-24: 325 mg via ORAL
  Filled 2011-06-23 (×2): qty 1

## 2011-06-23 MED ORDER — SODIUM CHLORIDE 0.9 % IV BOLUS (SEPSIS)
500.0000 mL | Freq: Once | INTRAVENOUS | Status: AC
  Administered 2011-06-23: 500 mL via INTRAVENOUS

## 2011-06-23 MED ORDER — ATENOLOL 25 MG PO TABS
25.0000 mg | ORAL_TABLET | Freq: Two times a day (BID) | ORAL | Status: DC
  Administered 2011-06-23 – 2011-06-25 (×4): 25 mg via ORAL
  Filled 2011-06-23 (×5): qty 1

## 2011-06-23 MED ORDER — PANTOPRAZOLE SODIUM 40 MG PO TBEC
40.0000 mg | DELAYED_RELEASE_TABLET | Freq: Every day | ORAL | Status: DC
  Administered 2011-06-24 – 2011-06-26 (×3): 40 mg via ORAL
  Filled 2011-06-23 (×3): qty 1

## 2011-06-23 MED ORDER — INSULIN ASPART 100 UNIT/ML ~~LOC~~ SOLN
0.0000 [IU] | Freq: Three times a day (TID) | SUBCUTANEOUS | Status: DC
  Administered 2011-06-24 (×2): 5 [IU] via SUBCUTANEOUS
  Administered 2011-06-24: 3 [IU] via SUBCUTANEOUS
  Administered 2011-06-25: 8 [IU] via SUBCUTANEOUS
  Administered 2011-06-25: 15 [IU] via SUBCUTANEOUS
  Administered 2011-06-25: 5 [IU] via SUBCUTANEOUS
  Administered 2011-06-26: 8 [IU] via SUBCUTANEOUS
  Administered 2011-06-26: 3 [IU] via SUBCUTANEOUS
  Administered 2011-06-26: 11 [IU] via SUBCUTANEOUS
  Filled 2011-06-23: qty 3

## 2011-06-23 MED ORDER — SENNOSIDES-DOCUSATE SODIUM 8.6-50 MG PO TABS: 1.0000 | ORAL_TABLET | Freq: Every evening | ORAL | Status: DC | PRN

## 2011-06-23 MED ORDER — ASPIRIN 300 MG RE SUPP
300.0000 mg | Freq: Every day | RECTAL | Status: DC
  Administered 2011-06-23: 300 mg via RECTAL
  Filled 2011-06-23 (×2): qty 1

## 2011-06-23 MED ORDER — CLONIDINE HCL 0.1 MG PO TABS
0.1000 mg | ORAL_TABLET | Freq: Two times a day (BID) | ORAL | Status: DC
  Administered 2011-06-23 – 2011-06-26 (×6): 0.1 mg via ORAL
  Filled 2011-06-23 (×7): qty 1

## 2011-06-23 MED ORDER — SODIUM CHLORIDE 0.9 % IJ SOLN: 3.0000 mL | INTRAMUSCULAR | Status: DC | PRN

## 2011-06-23 MED ORDER — SIMVASTATIN 40 MG PO TABS
40.0000 mg | ORAL_TABLET | Freq: Every day | ORAL | Status: DC
  Administered 2011-06-23 – 2011-06-25 (×3): 40 mg via ORAL
  Filled 2011-06-23 (×4): qty 1

## 2011-06-23 MED ORDER — HYDRALAZINE HCL 20 MG/ML IJ SOLN
10.0000 mg | INTRAMUSCULAR | Status: DC | PRN
  Filled 2011-06-23: qty 0.5

## 2011-06-23 MED ORDER — ACETAMINOPHEN ER 650 MG PO TBCR: 650.0000 mg | EXTENDED_RELEASE_TABLET | Freq: Three times a day (TID) | ORAL | Status: DC

## 2011-06-23 MED ORDER — FUROSEMIDE 10 MG/ML IJ SOLN
40.0000 mg | Freq: Three times a day (TID) | INTRAMUSCULAR | Status: DC
  Administered 2011-06-24: 40 mg via INTRAVENOUS
  Filled 2011-06-23 (×6): qty 4

## 2011-06-23 MED ORDER — ONDANSETRON HCL 4 MG PO TABS: 4.0000 mg | ORAL_TABLET | Freq: Three times a day (TID) | ORAL | Status: DC | PRN

## 2011-06-23 MED ORDER — SODIUM CHLORIDE 0.9 % IJ SOLN
3.0000 mL | Freq: Two times a day (BID) | INTRAMUSCULAR | Status: DC
  Administered 2011-06-23 – 2011-06-26 (×6): 3 mL via INTRAVENOUS

## 2011-06-23 MED ORDER — HYDROCODONE-ACETAMINOPHEN 5-325 MG PO TABS
1.0000 | ORAL_TABLET | ORAL | Status: DC | PRN
  Administered 2011-06-25 (×3): 1 via ORAL
  Filled 2011-06-23 (×3): qty 1

## 2011-06-23 MED ORDER — HEPARIN SODIUM (PORCINE) 5000 UNIT/ML IJ SOLN
5000.0000 [IU] | Freq: Three times a day (TID) | INTRAMUSCULAR | Status: DC
  Administered 2011-06-23 – 2011-06-25 (×5): 5000 [IU] via SUBCUTANEOUS
  Filled 2011-06-23 (×8): qty 1

## 2011-06-23 MED ORDER — ONDANSETRON HCL 4 MG/2ML IJ SOLN: 4.0000 mg | Freq: Three times a day (TID) | INTRAMUSCULAR | Status: DC | PRN

## 2011-06-23 MED ORDER — ACETAMINOPHEN 325 MG PO TABS
650.0000 mg | ORAL_TABLET | Freq: Three times a day (TID) | ORAL | Status: DC
  Administered 2011-06-23 – 2011-06-26 (×9): 650 mg via ORAL
  Filled 2011-06-23 (×9): qty 2

## 2011-06-23 MED ORDER — INSULIN ASPART 100 UNIT/ML ~~LOC~~ SOLN
0.0000 [IU] | Freq: Every day | SUBCUTANEOUS | Status: DC
  Administered 2011-06-24: 4 [IU] via SUBCUTANEOUS

## 2011-06-23 MED ORDER — GABAPENTIN 100 MG PO CAPS
100.0000 mg | ORAL_CAPSULE | Freq: Three times a day (TID) | ORAL | Status: DC
  Administered 2011-06-23 – 2011-06-26 (×9): 100 mg via ORAL
  Filled 2011-06-23 (×10): qty 1

## 2011-06-23 MED ORDER — MECLIZINE HCL 25 MG PO TABS
50.0000 mg | ORAL_TABLET | Freq: Once | ORAL | Status: AC
  Administered 2011-06-23: 50 mg via ORAL
  Filled 2011-06-23: qty 2

## 2011-06-23 NOTE — ED Notes (Signed)
Patient states intermittent dizziness today standing up and also laying or sitting.  Patient ax4 answering and following commands appropriate.  Airway intact bilateral equal chest rise and fall.  Patient lower extremity edema +2 pedal pulses +1 bilateral. States edema increasing overtime.  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 Patient is hard of hearing.

## 2011-06-23 NOTE — ED Notes (Signed)
Pt used the bed pan. 

## 2011-06-23 NOTE — ED Provider Notes (Signed)
Complains of dizziness i.e. room spinning onset this morning somewhat improved since arrival in the emergency department. On exam alert awake becomes a tetanus upon sitting up in bed. Able walk with assistance of 2RNs shuffling gait  Doug Sou, MD 06/23/11 1423

## 2011-06-23 NOTE — ED Notes (Signed)
Onset today patient states intermittent dizziness while standing.  Denies any other complaints Ax4 per EMS.  BP 174/88 heart rate 70 EKG Normal Sinus.

## 2011-06-23 NOTE — H&P (Signed)
Tina [name] is an 71 y.o. female.    Chief Complaint: Dizziness  HPI:   Patient is a poor historian.  She is very tearful and anxious throughout exam.  71 yo obese F with history of Diabetes, CKD Stage 3 and HTN who presents with dizziness and vertigo-like symptoms that began last night.  When she went to lay down in bed, it felt like room and ceiling were spinning.  This has never happened before.  She woke up this morning and spinning continued so she called EMS.  Patient also could not stand or walk today because of the dizziness. No tinnitus or vomiting.  At baseline, she walks with a cane.  Currently in ED, spinning has resolved.  She says she was confused this morning as well, but family member says that her mental status is at baseline.    She says her biggest problem is "sugars."  Niece took her sugar this morning and it was in 300's.  Reports sugar being in the 500's on multiple occasions this week. She did not take any diabetes medications today.  Denies any fever, chills, sweating, chest pain, or SOB.  Denies abdominal pain or vomiting.  Endorses polyuria (every 2.5 hours during day and twice per night).   Past Medical History  Diagnosis Date  . Normal exercise sestamibi stress test 2007    [facility]  . Diabetes mellitus   . Hyperlipidemia     Past Surgical History  Procedure Date  . Bladder surgery 1994  . Knee surgery as child  . Abdominal hysterectomy     Problem Relation  . Coronary artery disease Mother  . Diabetes Mother  . Heart disease Mother  . Diabetes Sister  . Hypertension Sister  . Cancer Father   Social History:  reports that she has never smoked. She has never used smokeless tobacco. She reports that she does not drink alcohol or use illicit drugs.  Lives at home alone.  Son lives in [place] and visits often.  Allergies: No Known Allergies  Medication Dose Route Frequency  . meclizine (ANTIVERT) tablet 50 mg  50 mg Oral Once  . sodium  chloride 0.9 % bolus 500 mL  500 mL Intravenous Once   Medication Sig  . acetaminophen (TYLENOL) 650 MG CR tablet Take 650 mg by mouth every 8 (eight) hours as needed.    Marland Kitchen atenolol (TENORMIN) 25 MG tablet Take 25 mg by mouth 2 (two) times daily.  . diclofenac sodium (VOLTAREN) 1 % GEL Apply 1 application topically 4 (four) times daily as needed. As needed for pain  . enalapril (VASOTEC) 20 MG tablet Take 20 mg by mouth 2 (two) times daily.  Marland Kitchen esomeprazole (NEXIUM) 40 MG capsule Take 40 mg by mouth daily before breakfast.  . glimepiride (AMARYL) 2 MG tablet Take 2 tablets (4 mg total) by mouth daily before breakfast.  . hydrochlorothiazide (HYDRODIURIL) 50 MG tablet Take 50 mg by mouth daily.  Marland Kitchen HYDROcodone-acetaminophen (VICODIN) 5-500 MG per tablet Take 1 tablet by mouth every 6 (six) hours as needed. For pain  . HYDROcodone-acetaminophen (VICODIN) 5-500 MG per tablet Take 1 tablet by mouth every 6 (six) hours as needed. For pain  . simvastatin (ZOCOR) 40 MG tablet Take 40 mg by mouth at bedtime.  . sitaGLIPtin (JANUVIA) 50 MG tablet Take 50 mg by mouth daily.  Marland Kitchen DISCONTD: atenolol (TENORMIN) 25 MG tablet Take 1 tablet (25 mg total) by mouth 2 (two) times daily.  Marland Kitchen DISCONTD: enalapril (VASOTEC) 20  MG tablet Take 1 tablet (20 mg total) by mouth 2 (two) times daily.  Marland Kitchen DISCONTD: esomeprazole (NEXIUM) 40 MG capsule Take 1 capsule (40 mg total) by mouth daily before breakfast.  . DISCONTD: hydrochlorothiazide 50 MG tablet Take 1 tablet (50 mg total) by mouth daily.  Marland Kitchen DISCONTD: HYDROcodone-acetaminophen (VICODIN) 5-500 MG per tablet Take 1 tablet by mouth every 8 (eight) hours as needed. For pain  . DISCONTD: simvastatin (ZOCOR) 40 MG tablet Take 1 tablet (40 mg total) by mouth at bedtime.  Marland Kitchen DISCONTD: sitaGLIPtin (JANUVIA) 50 MG tablet Take 1 tablet (50 mg total) by mouth daily.  . Glucose Blood (ASCENSIA CONTOUR TEST VI)    . DISCONTD: HYDROcodone-acetaminophen (VICODIN) 5-500 MG per tablet  Take 1 tablet by mouth every 8 (eight) hours as needed for pain. Prescription should last one month  . DISCONTD: HYDROcodone-acetaminophen (VICODIN) 5-500 MG per tablet Take 1 tablet by mouth every 6 (six) hours as needed for pain (Not more than 20 per month).            Component Value   Sodium 139    Potassium 4.3    Chloride 104    CO2 23    Glucose, Bld 303 (*)   BUN 31 (*)   Creatinine, Ser 1.86 (*)   Calcium 8.5    Total Protein 6.4    Albumin 3.3 (*)   AST 15    ALT 11    Alkaline Phosphatase 89    Total Bilirubin 0.4    GFR calc non Af Amer 26 (*)   GFR calc Af Amer 31 (*)          Component Value   WBC 7.4    RBC 3.97    Hemoglobin 11.3 (*)   HCT 33.3 (*)   MCV 83.9    MCH 28.5    MCHC 33.9    RDW 13.3    Platelets 169           Component Value   Neutrophils Relative 69    Lymphocytes Relative 22    Monocytes Relative 7    Eosinophils Relative 2    Basophils Relative 0    Neutro Abs 5.2    Lymphs Abs 1.6    Monocytes Absolute 0.5    Eosinophils Absolute 0.1    Basophils Absolute 0.0    RBC Morphology POLYCHROMASIA PRESENT           Component Value   Troponin i, poc 0.00    Comment 3           Ct Head Wo Contrast  06/23/2011  *RADIOLOGY REPORT*  Clinical Data: Headache, dizziness.  CT HEAD WITHOUT CONTRAST  IMPRESSION: Minimal chronic small vessel disease. No acute intracranial abnormality.  Original Report Authenticated By: Cyndie Chime, M.D.   Mr Brain Wo Contrast  06/23/2011  *RADIOLOGY REPORT*  Clinical Data: Dizziness. Diabetic.  Hyperlipidemia.  MRI HEAD WITHOUT CONTRAST IMPRESSION: Questionable tiny acute left paracentral pontine infarct.  Remote right paracentral pontine infarct.  Remote tiny left thalamic infarct.  Mild to moderate small vessel disease type changes.  Mild global atrophy without hydrocephalus.  Please see above.  Original Report Authenticated By: Fuller Canada, M.D.    Review of Systems  Constitutional: Negative for  fever, chills and diaphoresis.  HENT: Negative for tinnitus.   Eyes: Negative for blurred vision.  Respiratory: Negative for shortness of breath.   Cardiovascular: Negative for chest pain.  Gastrointestinal: Negative for vomiting, abdominal  pain, diarrhea and constipation.  Genitourinary: Positive for frequency. Negative for dysuria.  Musculoskeletal: Negative for falls.  Neurological: Positive for dizziness and headaches.    Blood pressure 157/65, pulse 71, temperature 97.8 F (36.6 C), temperature source Oral, resp. rate 12, SpO2 98.00%.  Physical Exam  Constitutional: She is oriented to person, place, and time. She appears well-developed and well-nourished. No distress.       Anxious and tearful.   HENT:  Head: Normocephalic and atraumatic.  Mouth/Throat: Oropharynx is clear and moist.  Eyes: Conjunctivae and EOM are normal. Pupils are equal, round, and reactive to light. No scleral icterus.  Neck: Normal range of motion. Neck supple.  Cardiovascular: Normal rate, regular rhythm and normal heart sounds.  Exam reveals no gallop and no friction rub.   No murmur heard. Respiratory: Effort normal and breath sounds normal. No respiratory distress. She has no wheezes. She has no rales.  GI: Soft. Bowel sounds are normal. She exhibits no distension and no mass. There is no tenderness. There is no rebound and no guarding.  Musculoskeletal:       3+ Bilateral pitting edema to knees   Neurological: She is alert and oriented to person, place, and time.       Grossly intact.  Skin: Skin is warm. No rash noted. She is not diaphoretic. No erythema.     Assessment/Plan 71 yo obese F with history of Diabetes, CKD Stage 3 and HTN who presents with dizziness and vertigo-like symptoms that began last night.   1. Dizziness/ Vertigo - Sudden onset now improving. MRI shows questionable tiny acute left paracentral pontine infarct. Differential includes hyperglycemia, TIA vs CVA. No other neurologic  deficits noted. - Admit to tele and start stroke protocol - Outside therapeutic window for thrombolytics - Monitor CBG and check for clinical improvement - Received one dose of Meclizine in ED. Will hold for now and see if symptoms improve. - PT/OT consult  2. Bilateral Pitting Edema: Fluid overload secondary to HTN verse CHF. - Follow up CXR  - Will diurese with Lasix 40 IV TID. Consider increase if edema does not improve - Monitor daily CMP for Creatinine (baseline appears to be 1.7-1.9), currently 1.86 - Patient does not have diagnosis of CHF. Will check Echo and BNP. - Daily weights and strict I/O   3. HTN - Will continue Atenolol and Clonidine - Hold Enalapril as plan to diurese - Hold HCTZ as will give Lasix - Hydralizine as needed for systolics > 180, diastolic > 100   4. Diabetes - Check HbA1c - Hold Enalapril as will diurese - Monitor CBG - Hold home medications and start SSI  5. GERD - Continue Protonix  6. HLD - Continue home statin  FEN/GI: Diabetic diet as tolerated.  PPx: Heparin. Dispo: Pending clinical improvement.  Beverly Milch, MS4 Family Practice Teaching Service 06/23/2011, 6:39 PM  PGY-2 ADDENDUM:  I have seen and examined patient with Beverly Milch, MS4.    PHYSICAL EXAM: Constitutional: She is oriented to person, place, and time.  No distress.       Anxious and tearful. Eyes: Conjunctivae and EOM are normal. Pupils are equal, round, and reactive to light. No scleral icterus.  Cardiovascular: Distant heart sounds, but Normal rate, regular rhythm and normal heart sounds.  No murmur heard. Respiratory: Effort normal and breath sounds normal. No respiratory distress. She has no wheezes. She has no rales.  GI: Soft. Bowel sounds are normal. She exhibits no distension and no mass. There  is no tenderness.  Musculoskeletal:       3+ Bilateral pitting pedal edema to knees, tender to touch, diminished distal pulses  Neurological: She is alert and oriented  to person, place, and time.       Grossly intact.  Skin: Skin is warm. No rash noted. She is not diaphoretic. No erythema.    ASSESSMENT/PLAN:  I have reviewed and discussed plan with Beverly Milch and have made additions to above plan.  DE LA CRUZ,IVY 06/23/11, 8:05 PM

## 2011-06-23 NOTE — ED Provider Notes (Signed)
History     CSN: [id number]  Arrival date & time 06/23/11  1043   First MD Initiated Contact with Patient 06/23/11 1103      Chief Complaint  Patient presents with  . Dizziness    (Consider location/radiation/quality/duration/timing/severity/associated sxs/prior treatment) The history is provided by the patient.   Patient presents with dizziness, which started last evening. It is described as a sensation of the room spinning around her, and worsens with standing or movement. It got particularly severe when she got up to go to the bathroom last evening. She did not have any associated nausea or vomiting with this. Denies associated chest pain, diaphoresis, or shortness of breath. She has not had any tinnitus or loss of hearing. Denies abdominal pain. Denies any weakness, numbness, tingling.  She had a similar episode approximately 5 years ago, which spontaneously resolved. No known hx TIA or CVA.    Past Medical History  Diagnosis Date  . Normal exercise sestamibi stress test 2007    [facility]  . Diabetes mellitus   . Hyperlipidemia     Past Surgical History  Procedure Date  . Bladder surgery 1994  . Knee surgery as child  . Abdominal hysterectomy     Family History  Problem Relation Age of Onset  . Coronary artery disease Mother   . Diabetes Mother   . Heart disease Mother   . Diabetes Sister   . Hypertension Sister   . Cancer Father     History  Substance Use Topics  . Smoking status: Never Smoker   . Smokeless tobacco: Never Used  . Alcohol Use: No    OB History    Grav Para Term Preterm Abortions TAB SAB Ect Mult Living                  Review of Systems  Constitutional: Positive for appetite change. Negative for fever, chills and activity change.       Slightly reduced appetite for the past several days  HENT: Negative for congestion, sore throat, rhinorrhea, neck pain and neck stiffness.   Eyes: Negative for photophobia and visual disturbance.    Respiratory: Negative for chest tightness and shortness of breath.   Cardiovascular: Negative for chest pain and palpitations.  Gastrointestinal: Negative for nausea, vomiting, abdominal pain and diarrhea.  Genitourinary: Negative.   Musculoskeletal: Negative for myalgias and back pain.  Neurological: Positive for dizziness. Negative for seizures and syncope.  Psychiatric/Behavioral: Negative for confusion and agitation.    Allergies  Review of patient's allergies indicates no known allergies.  Home Medications   Current Outpatient Rx  Name Route Sig Dispense Refill  . ACETAMINOPHEN ER 650 MG PO TBCR Oral Take 650 mg by mouth every 8 (eight) hours as needed.      . ATENOLOL 25 MG PO TABS Oral Take 25 mg by mouth 2 (two) times daily.    Marland Kitchen CLONIDINE HCL 0.1 MG PO TABS Oral Take 0.1 mg by mouth 2 (two) times daily.    Marland Kitchen DICLOFENAC SODIUM 1 % TD GEL Topical Apply 1 application topically 4 (four) times daily as needed. As needed for pain    . ENALAPRIL MALEATE 20 MG PO TABS Oral Take 20 mg by mouth 2 (two) times daily.    Marland Kitchen ESOMEPRAZOLE MAGNESIUM 40 MG PO CPDR Oral Take 40 mg by mouth daily before breakfast.    . GABAPENTIN 100 MG PO CAPS Oral Take 100 mg by mouth 3 (three) times daily.    Marland Kitchen  GLIMEPIRIDE 2 MG PO TABS Oral Take 2 tablets (4 mg total) by mouth daily before breakfast.    . HYDROCHLOROTHIAZIDE 50 MG PO TABS Oral Take 50 mg by mouth daily.    Marland Kitchen HYDROCODONE-ACETAMINOPHEN 5-500 MG PO TABS Oral Take 1 tablet by mouth every 6 (six) hours as needed. For pain    . HYDROCODONE-ACETAMINOPHEN 5-500 MG PO TABS Oral Take 1 tablet by mouth every 6 (six) hours as needed. For pain    . ONDANSETRON HCL 4 MG PO TABS Oral Take 4 mg by mouth every 6 (six) hours as needed. For nausea    . SIMVASTATIN 40 MG PO TABS Oral Take 40 mg by mouth at bedtime.    Marland Kitchen SITAGLIPTIN PHOSPHATE 50 MG PO TABS Oral Take 50 mg by mouth daily.    . ASCENSIA CONTOUR TEST VI         BP 161/69  Pulse 64  Temp(Src)  97.3 F (36.3 C) (Oral)  Resp 18  SpO2 97%  Physical Exam  Nursing note and vitals reviewed. Constitutional: She is oriented to person, place, and time. She appears well-developed and well-nourished. No distress.  HENT:  Head: Normocephalic and atraumatic.  Right Ear: External ear normal.  Left Ear: External ear normal.  Nose: Nose normal.  Mouth/Throat: Oropharynx is clear and moist. No oropharyngeal exudate.  Eyes: Conjunctivae and EOM are normal. Pupils are equal, round, and reactive to light. Right eye exhibits no discharge. Left eye exhibits no discharge.       No nystagmus seen  Neck: Normal range of motion. Neck supple.  Cardiovascular: Normal rate, regular rhythm and normal heart sounds.  Exam reveals no gallop and no friction rub.   No murmur heard. Pulmonary/Chest: Effort normal and breath sounds normal. She has no wheezes. She exhibits no tenderness.  Abdominal: Soft. Bowel sounds are normal. There is no tenderness.  Musculoskeletal: Normal range of motion.       Bilateral 1+ nonpitting edema, intact DP/PT pulses b/l  Lymphadenopathy:    She has no cervical adenopathy.  Neurological: She is alert and oriented to person, place, and time. She displays normal reflexes. No cranial nerve deficit or sensory deficit. She exhibits normal muscle tone. Coordination normal.  Skin: Skin is warm and dry. No rash noted. She is not diaphoretic.  Psychiatric: She has a normal mood and affect.    ED Course  Procedures (including critical care time)   Date: 06/23/2011  Rate: 74  Rhythm: normal sinus rhythm  QRS Axis: normal  Intervals: normal  ST/T Wave abnormalities: normal  Conduction Disutrbances:none  Narrative Interpretation:   Old EKG Reviewed: no significant changes as compared with Nov 2010  Labs Reviewed  CBC - Abnormal; Notable for the following:    Hemoglobin 11.3 (*)    HCT 33.3 (*)    All other components within normal limits  DIFFERENTIAL  COMPREHENSIVE  METABOLIC PANEL   Ct Head Wo Contrast  06/23/2011  *RADIOLOGY REPORT*  Clinical Data: Headache, dizziness.  CT HEAD WITHOUT CONTRAST  Technique:  Contiguous axial images were obtained from the base of the skull through the vertex without contrast.  Comparison: 04/10/2009  Findings: Minimal chronic small vessel disease throughout the deep white matter.  No hydrocephalus.  No hemorrhage or acute infarction.  No mass lesion or extra-axial fluid collection.  No acute calvarial abnormality. Visualized paranasal sinuses and mastoids clear.  Orbital soft tissues unremarkable.  IMPRESSION: Minimal chronic small vessel disease. No acute intracranial abnormality.  Original Report  Authenticated By: Cyndie Chime, M.D.     No diagnosis found.    MDM  Discussed with Dr. Ethelda Chick, who saw the patient with me. She was walked with 2 nurses and was noted to be unsteady. CT head negative. Labs largely unremarkable. Pt states her symptoms have improved somewhat since she called EMS this morning. Suspect that this is most likely vertiginous in nature, but plan to obtain MR to r/o posterior circulation CVA. Meclizine given.  4:55 PM Continue to await MR. Signout given to Dr. Brooke Dare, who will follow.     Grant Fontana, [place] 06/23/11 [phone]

## 2011-06-23 NOTE — ED Notes (Signed)
Family at bedside. 

## 2011-06-23 NOTE — ED Notes (Signed)
Admit Doctor at bedside.  

## 2011-06-23 NOTE — ED Notes (Signed)
3040-01 Ready 

## 2011-06-23 NOTE — ED Notes (Signed)
Pt back from MRI 

## 2011-06-23 NOTE — ED Notes (Signed)
Pt has is using the bedpan very frequently and has requested a foley

## 2011-06-23 NOTE — ED Provider Notes (Signed)
Medical screening examination/treatment/procedure(s) were conducted as a shared visit with non-physician practitioner(s) and myself.  I personally evaluated the patient during the encounter  Lanah Steines, MD 06/23/11 1757 

## 2011-06-23 NOTE — ED Notes (Signed)
Per EMS CBG 300

## 2011-06-24 ENCOUNTER — Encounter (HOSPITAL_COMMUNITY): Payer: Self-pay | Admitting: General Practice

## 2011-06-24 DIAGNOSIS — I6350 Cerebral infarction due to unspecified occlusion or stenosis of unspecified cerebral artery: Secondary | ICD-10-CM

## 2011-06-24 DIAGNOSIS — H814 Vertigo of central origin: Secondary | ICD-10-CM

## 2011-06-24 LAB — HEMOGLOBIN A1C
Hgb A1c MFr Bld: 12.6 % — ABNORMAL HIGH (ref <5.7)
Mean Plasma Glucose: 312 mg/dL — ABNORMAL HIGH (ref <117)
Mean Plasma Glucose: 315 mg/dL — ABNORMAL HIGH (ref <117)

## 2011-06-24 LAB — LIPID PANEL
Cholesterol: 150 mg/dL (ref 0–200)
HDL: 25 mg/dL — ABNORMAL LOW (ref >39)
LDL Cholesterol: 52 mg/dL (ref 0–99)
Triglycerides: 364 mg/dL — ABNORMAL HIGH (ref <150)

## 2011-06-24 LAB — GLUCOSE, CAPILLARY
Glucose-Capillary: 200 mg/dL — ABNORMAL HIGH (ref 70–99)
Glucose-Capillary: 311 mg/dL — ABNORMAL HIGH (ref 70–99)

## 2011-06-24 MED ORDER — INSULIN PEN STARTER KIT
1.0000 | Freq: Once | Status: AC
  Administered 2011-06-24: 1
  Filled 2011-06-24: qty 1

## 2011-06-24 MED ORDER — INSULIN GLARGINE 100 UNIT/ML ~~LOC~~ SOLN
10.0000 [IU] | Freq: Every day | SUBCUTANEOUS | Status: DC
  Administered 2011-06-24: 10 [IU] via SUBCUTANEOUS
  Filled 2011-06-24: qty 3

## 2011-06-24 MED ORDER — MECLIZINE HCL 12.5 MG PO TABS
12.5000 mg | ORAL_TABLET | Freq: Three times a day (TID) | ORAL | Status: DC | PRN
  Administered 2011-06-25: 12.5 mg via ORAL
  Filled 2011-06-24 (×2): qty 1

## 2011-06-24 MED ORDER — CLOPIDOGREL BISULFATE 75 MG PO TABS
75.0000 mg | ORAL_TABLET | Freq: Every day | ORAL | Status: DC
  Administered 2011-06-25 – 2011-06-26 (×2): 75 mg via ORAL
  Filled 2011-06-24 (×4): qty 1

## 2011-06-24 MED ORDER — DULOXETINE HCL 20 MG PO CPEP
20.0000 mg | ORAL_CAPSULE | Freq: Every day | ORAL | Status: DC
  Filled 2011-06-24: qty 1

## 2011-06-24 MED ORDER — TRAZODONE HCL 50 MG PO TABS
50.0000 mg | ORAL_TABLET | Freq: Once | ORAL | Status: AC
  Administered 2011-06-24: 50 mg via ORAL
  Filled 2011-06-24 (×2): qty 1

## 2011-06-24 MED ORDER — ESCITALOPRAM OXALATE 10 MG PO TABS
10.0000 mg | ORAL_TABLET | Freq: Every day | ORAL | Status: DC
  Administered 2011-06-24: 10 mg via ORAL
  Filled 2011-06-24 (×2): qty 1

## 2011-06-24 NOTE — Progress Notes (Signed)
  Echocardiogram 2D Echocardiogram has been performed.  Tina [name] 06/24/2011, 11:48 AM

## 2011-06-24 NOTE — H&P (Signed)
[facility] Attending Admission Note: Tina Levy MD [phone] pager office [phone] I  have seen and examined this patient, reviewed their chart. I have discussed this patient with the resident. I agree with the resident's findings, assessment and care plan. I think the subacute pontine injury noted on MRI could be cause of her vertigo. We have consulted neurology and will complete full stroke work up.

## 2011-06-24 NOTE — Evaluation (Signed)
Occupational Therapy Evaluation Patient Details Name: Tina [name] MRN: [id number] DOB: 03/30/1941 Today's Date: 06/24/2011  Problem List:  Patient Active Problem List  Diagnoses  . CARCINOMA, SKIN, SQUAMOUS CELL  . DM (diabetes mellitus) with complications  . DIABETIC PERIPHERAL NEUROPATHY  . HYPERLIPIDEMIA  . OBESITY NOS  . ANEMIA NEC  . ESSENTIAL HYPERTENSION  . GERD  . CHRONIC KIDNEY DISEASE STAGE III (MODERATE)  . DEGENERATIVE JOINT DISEASE  . INSOMNIA  . Shoulder pain    Past Medical History:  Past Medical History  Diagnosis Date  . Normal exercise sestamibi stress test 2007    [facility]  . Diabetes mellitus   . Hyperlipidemia   . Hypertension   . Heart murmur   . Chronic kidney disease     CKD stage 3  . Depression   . Stroke 06/2011    TIA    Past Surgical History:  Past Surgical History  Procedure Date  . Bladder surgery 1994  . Knee surgery as child  . Abdominal hysterectomy     OT Assessment/Plan/Recommendation OT Assessment Clinical Impression Statement: This 71 y.o. female presents to OT with pontine CVA.  OT eval was limited to increases in HR while pt. sitting EOB.  Pt. appears very anxious throughout session and demonstrates impaired cognition, dizziness, impaired independence with BADLs and bed mobility.  Spoke with pt's son, who reports pt. has been marginally managing at home with a decline in physical and cognitive functioning for the past year.  He is unable to provide 24 hour assistance, and therefore, SNF is recommended.  Spoke with pt. and she states she wants to return home with HHOT.  Long discussion with pt. re: risk for injury and need for assistance at D/C.  Feel she will benefit from OT to maximize safety and independence with BADLs to allow her to return to modified independence after SNF level rehab OT Recommendation/Assessment: Patient will need skilled OT in the acute care venue OT Problem List: Decreased strength;Decreased activity  tolerance;Impaired balance (sitting and/or standing);Impaired vision/perception;Decreased cognition;Decreased safety awareness;Decreased knowledge of use of DME or AE Barriers to Discharge: Decreased caregiver support;Inaccessible home environment OT Therapy Diagnosis : Generalized weakness;Cognitive deficits;Disturbance of vision OT Plan OT Frequency: Min 2X/week OT Treatment/Interventions: Self-care/ADL training;DME and/or AE instruction;Therapeutic activities;Cognitive remediation/compensation;Visual/perceptual remediation/compensation;Patient/family education;Balance training OT Recommendation Follow Up Recommendations: Skilled nursing facility Equipment Recommended: Defer to next venue Individuals Consulted Consulted and Agree with Results and Recommendations: Patient;Family member/caregiver Family Member Consulted: son OT Goals Acute Rehab OT Goals OT Goal Formulation: With patient/family Time For Goal Achievement: 2 weeks ADL Goals Pt Will Perform Grooming: with min assist;Standing at sink ADL Goal: Grooming - Progress: Goal set today Pt Will Perform Upper Body Bathing: with set-up;Sitting, chair ADL Goal: Upper Body Bathing - Progress: Goal set today Pt Will Perform Lower Body Bathing: with min assist;Sit to stand from chair;Sit to stand from bed ADL Goal: Lower Body Bathing - Progress: Goal set today Pt Will Transfer to Toilet: with min assist;Ambulation;Raised toilet seat with arms ADL Goal: Toilet Transfer - Progress: Goal set today Pt Will Perform Toileting - Clothing Manipulation: with min assist;Standing ADL Goal: Toileting - Clothing Manipulation - Progress: Goal set today Additional ADL Goal #1: Pt. will participate in further visual assesment ADL Goal: Additional Goal #1 - Progress: Goal set today  OT Evaluation Precautions/Restrictions  Precautions Precautions: Fall Restrictions Weight Bearing Restrictions: No Prior Functioning Home Living Lives With:  Alone Receives Help From: Family Type of Home: Mobile home Home Layout:  One level Home Access: Stairs to enter Entrance Stairs-Rails: Can reach both Entrance Stairs-Number of Steps: 6 Bathroom Shower/Tub: Engineer, manufacturing systems: Standard Bathroom Accessibility: Yes How Accessible: Accessible via walker Home Adaptive Equipment: Bedside commode/3-in-1;Raised toilet seat with rails;Straight cane (lift chair) Prior Function Level of Independence: Independent with basic ADLs;Independent with gait;Requires assistive device for independence;Needs assistance with homemaking (son assists with home management) Able to Take Stairs?: Yes Driving: Yes Vocation: Retired Comments: Spoke with son who reports pt. with declining ability to manage home over the past year.  He reports she has been disoriented at times, and noted urine in the house ADL ADL Eating/Feeding: Performed;Independent Where Assessed - Eating/Feeding: Bed level Grooming: Simulated;Wash/dry hands;Wash/dry face;Teeth care;Set up Where Assessed - Grooming: Unsupported;Sitting, bed Upper Body Bathing: Simulated;Supervision/safety Where Assessed - Upper Body Bathing: Unsupported;Sitting, chair Lower Body Bathing: Simulated;Maximal assistance Where Assessed - Lower Body Bathing: Sit to stand from bed Upper Body Dressing: Simulated;Minimal assistance Where Assessed - Upper Body Dressing: Unsupported;Sitting, bed Lower Body Dressing: Simulated;+1 Total assistance Where Assessed - Lower Body Dressing: Sit to stand from bed Toilet Transfer: Not assessed ADL Comments: Pt. moved to EOB with min A.  Pt. required min A to scoot to EOB.  Requires significant amount of time to perform tasks.  Pt. appears anxious throughout session.  Pt. with HR in the high 70s.  While sitting EOB, HR increased from 89 to 167, but immediately returned to 97-103.   Pt. continued to have intermittent increases of HR to 148-157, but HR would immediately  return to the 90's.  However, increases occurred more frequently, then HR increased to 154 and maintained at 134-143 for ~1 minute.  RN aware.  And pt. returned to supine. Vision/Perception  Vision - History Baseline Vision: Wears glasses all the time Patient Visual Report: No change from baseline (pt reports dizziness ) Vision - Assessment Eye Alignment: Within Functional Limits Vision Assessment: Vision tested Ocular Range of Motion: Within Functional Limits Tracking/Visual Pursuits: Able to track stimulus in all quads without difficulty Additional Comments: Vision assesment not completed due to increased HR, and OT session terminated Perception Perception: Within Functional Limits Praxis Praxis: Intact Cognition Cognition Arousal/Alertness: Awake/alert Overall Cognitive Status: Impaired Attention: Impaired Current Attention Level: Sustained Orientation Level: Oriented X4 Following Commands: Appears intact for tasks assessed Safety/Judgement: Decreased safety judgement for tasks assessed Problem Solving: Requires assistance for problem solving Cognition - Other Comments: Pt very anxious limiting ability to problem solve and focus  Sensation/Coordination Coordination Gross Motor Movements are Fluid and Coordinated: Yes Fine Motor Movements are Fluid and Coordinated: Yes Extremity Assessment RUE Assessment RUE Assessment: Within Functional Limits LUE Assessment LUE Assessment: Within Functional Limits Mobility  Bed Mobility Bed Mobility: Yes Supine to Sit: 4: Min assist;With rails;HOB elevated (Comment degrees) (30) Sitting - Scoot to Edge of Bed: With rail;3: Mod assist Sit to Supine: 3: Mod assist Exercises   End of Session OT - End of Session Activity Tolerance: Treatment limited secondary to medical complications (Comment) (Increased HR) Patient left: in bed;with call bell in reach;with family/visitor present Nurse Communication: Other (comment) (increased  HR) General Behavior During Session: Other (comment) (anxious) Cognition: Impaired   Anish Tina [name] M 06/24/2011, 6:00 PM

## 2011-06-24 NOTE — Evaluation (Signed)
Speech Language Pathology Evaluation Patient Details Name: Tina [name] MRN: [id number] DOB: 07/27/1940 Today's Date: 06/24/2011  GENERAL:  71 yr old admitted with dizziness, confusion (family reports at baseline).  MRI revealed questionable tiny acute left pontine CVA and remote left CVA Problem List:  Patient Active Problem List  Diagnoses  . CARCINOMA, SKIN, SQUAMOUS CELL  . DM (diabetes mellitus) with complications  . DIABETIC PERIPHERAL NEUROPATHY  . HYPERLIPIDEMIA  . OBESITY NOS  . ANEMIA NEC  . ESSENTIAL HYPERTENSION  . GERD  . CHRONIC KIDNEY DISEASE STAGE III (MODERATE)  . DEGENERATIVE JOINT DISEASE  . INSOMNIA  . Shoulder pain   Past Medical History:  Past Medical History  Diagnosis Date  . Normal exercise sestamibi stress test 2007    [facility]  . Diabetes mellitus   . Hyperlipidemia   . Hypertension   . Heart murmur   . Chronic kidney disease     CKD stage 3  . Depression   . Stroke 06/2011    TIA    Past Surgical History:  Past Surgical History  Procedure Date  . Bladder surgery 1994  . Knee surgery as child  . Abdominal hysterectomy     SLP Assessment/Plan/Recommendation Assessment Clinical Impression Statement: Pt. exhibits functional cognitive ability for the acute care setting.  She is able to express and initiate problem solving to have her needs met most of the time without difficulty.  Discussion of problem solving/judgement/reasoning for home hypothetical situations revealed mild deficits. Pt. also appears anxious which most likely exacerbates her cognitive functioning. Pt. made several statements that were irrelevent to current topic of conversation..Pt. does not require ST in the acute venue but would benefit from home health ST assessment once she returns to home environment. SLP Recommendation/Assessment: All further Speech Lanaguage Pathology  needs can be addressed in the next venue of care SLP Recommendations Follow up Recommendations: Home  health SLP Equipment Recommended: Rolling walker with 5" wheels Individuals Consulted Consulted and Agree with Results and Recommendations: Patient  SLP Evaluation Prior Functioning Cognitive/Linguistic Baseline: Information not available Type of Home: Mobile home Lives With: Alone Receives Help From: Family (SON) Vocation: Retired  IT consultant Overall Cognitive Status: Impaired Arousal/Alertness: Awake/alert Orientation Level: Oriented X4 Attention: Selective (SUSPECT DEFICITS IN HIGHER LEVEL ATTENTION) Selective Attention: Appears intact Memory: Appears intact Awareness: Appears intact Problem Solving: Impaired Problem Solving Impairment: Verbal complex Safety/Judgment: Appears intact  Comprehension Auditory Comprehension Overall Auditory Comprehension: Appears within functional limits for tasks assessed Yes/No Questions: Not tested Commands: Not tested Conversation: Complex Interfering Components: Anxiety Visual Recognition/Discrimination Discrimination: Not tested Reading Comprehension Reading Status: Not tested  Expression Expression Primary Mode of Expression: Verbal Verbal Expression Overall Verbal Expression: Appears within functional limits for tasks assessed Initiation: No impairment Level of Generative/Spontaneous Verbalization: Conversation Repetition: No impairment Naming: No impairment Pragmatics: No impairment Non-Verbal Means of Communication: Not applicable Written Expression Written Expression: Not tested  Oral/Motor Oral Motor/Sensory Function Overall Oral Motor/Sensory Function: Appears within functional limits for tasks assessed Labial ROM: Within Functional Limits Labial Symmetry: Within Functional Limits Labial Strength: Within Functional Limits Lingual ROM: Within Functional Limits Lingual Symmetry: Within Functional Limits Facial ROM: Within Functional Limits Facial Symmetry: Within Functional Limits Facial Strength: Within  Functional Limits Velum: Within Functional Limits Mandible: Within Functional Limits Motor Speech Overall Motor Speech: Appears within functional limits for tasks assessed Respiration: Within functional limits Phonation: Normal Resonance: Within functional limits Articulation: Within functional limitis Intelligibility: Intelligible Motor Planning: Witnin functional limits Motor Speech Errors: Not applicable  Tina [name] [employer] [phone]  06/24/2011

## 2011-06-24 NOTE — Evaluation (Signed)
Physical Therapy Evaluation Patient Details Name: Ashanta Amoroso MRN: [id number] DOB: 23-Dec-1940 Today's Date: 06/24/2011  Problem List:  Patient Active Problem List  Diagnoses  . CARCINOMA, SKIN, SQUAMOUS CELL  . DM (diabetes mellitus) with complications  . DIABETIC PERIPHERAL NEUROPATHY  . HYPERLIPIDEMIA  . OBESITY NOS  . ANEMIA NEC  . ESSENTIAL HYPERTENSION  . GERD  . CHRONIC KIDNEY DISEASE STAGE III (MODERATE)  . DEGENERATIVE JOINT DISEASE  . INSOMNIA  . Shoulder pain    Past Medical History:  Past Medical History  Diagnosis Date  . Normal exercise sestamibi stress test 2007    [facility]  . Diabetes mellitus   . Hyperlipidemia   . Hypertension    Past Surgical History:  Past Surgical History  Procedure Date  . Bladder surgery 1994  . Knee surgery as child  . Abdominal hysterectomy     PT Assessment/Plan/Recommendation PT Assessment Clinical Impression Statement: Pt is 71 y/o female admitted for slurred speech, generalized weakness and dizziness.  MRI performed and found small right pontine infarct.  Pt very anxious throughout limiting overall evaluation.  Attempted to perform further vestibular eval however no nystagmus noted but pt having diffficulty following instructions.  Performed smooth prusuit, saccades, VOR cancellation and x1 exercise.  Pt reported dizziness with all with no nystagmus.  At this time due to pt lives alone will recommend SNF and will benefit from acute PT services to improve mobility, strength, further vestibular assessment and balance.   PT Recommendation/Assessment: Patient will need skilled PT in the acute care venue PT Problem List: Decreased strength;Decreased balance;Decreased activity tolerance;Decreased mobility;Decreased cognition;Decreased knowledge of use of DME PT Therapy Diagnosis : Difficulty walking;Abnormality of gait;Generalized weakness PT Plan PT Frequency: Min 4X/week PT Treatment/Interventions: Gait training;DME  instruction;Functional mobility training;Therapeutic activities;Therapeutic exercise;Balance training;Neuromuscular re-education;Cognitive remediation;Patient/family education PT Recommendation Follow Up Recommendations: Skilled nursing facility Equipment Recommended: Rolling walker with 5" wheels  Pt continue to state "I'm not ready to die yet."  MD entered room during evaluation and notified pt of small stroke.  Pt very emotional and continue to cry throughout session.  Recommend Psych consult for anxiety.  PT Goals  Acute Rehab PT Goals PT Goal Formulation: With patient Time For Goal Achievement: 2 weeks Pt will go Supine/Side to Sit: with supervision PT Goal: Supine/Side to Sit - Progress: Goal set today Pt will go Sit to Supine/Side: with supervision PT Goal: Sit to Supine/Side - Progress: Goal set today Pt will go Sit to Stand: with supervision PT Goal: Sit to Stand - Progress: Goal set today Pt will go Stand to Sit: with supervision PT Goal: Stand to Sit - Progress: Goal set today Pt will Transfer Bed to Chair/Chair to Bed: with supervision PT Transfer Goal: Bed to Chair/Chair to Bed - Progress: Goal set today Pt will Stand: with supervision;1 - 2 min;with bilateral upper extremity support PT Goal: Stand - Progress: Goal set today Pt will Ambulate: 51 - 150 feet;with rolling walker;with supervision PT Goal: Ambulate - Progress: Goal set today  PT Evaluation Precautions/Restrictions  Precautions Precautions: Fall Restrictions Weight Bearing Restrictions: No Prior Functioning  Home Living Lives With: Alone Type of Home: Mobile home Home Layout: One level Home Access: Stairs to enter Entrance Stairs-Rails: Can reach both Entrance Stairs-Number of Steps: 6 Bathroom Shower/Tub: Engineer, manufacturing systems: Standard Bathroom Accessibility: Yes How Accessible: Accessible via walker Home Adaptive Equipment: Bedside commode/3-in-1;Raised toilet seat with rails;Straight  cane Prior Function Level of Independence: Requires assistive device for independence;Independent with transfers;Independent with gait;Independent  with homemaking with ambulation;Independent with basic ADLs (It pt feels too weak will take a sponge bath) Able to Take Stairs?: Yes Driving: Yes Vocation: Retired Financial risk analyst Arousal/Alertness: Awake/alert Overall Cognitive Status: Impaired Attention: Impaired Current Attention Level: Sustained (due to anxiety) Orientation Level: Oriented X4 Following Commands: Appears intact for tasks assessed Problem Solving: Requires assistance for problem solving Cognition - Other Comments: Pt very anxious limiting ability to problem solve and focus  Sensation/Coordination Sensation Light Touch: Appears Intact Extremity Assessment RUE Assessment RUE Assessment: Within Functional Limits LUE Assessment LUE Assessment: Not tested (due to pain) RLE Assessment RLE Assessment: Exceptions to [facility] RLE Strength RLE Overall Strength: Due to impaired cognition (At least 3/5 gross with bed mobility but unable to MMT) LLE Assessment LLE Assessment: Exceptions to [facility] LLE Strength LLE Overall Strength: Due to impaired cognition (At least 3/5 gross with bed mobility) Mobility (including Balance) Bed Mobility Bed Mobility: Yes Rolling Right: 3: Mod assist Rolling Right Details (indicate cue type and reason): (A) to complete and initiate roll with max cues for technique Right Sidelying to Sit: 3: Mod assist;With rails;HOB flat Right Sidelying to Sit Details (indicate cue type and reason): (A) to elevate trunk OOB and advance hips and LE to EOB with max cues for technique Sitting - Scoot to Edge of Bed: 2: Max assist;With rail Sitting - Scoot to [place] of Bed Details (indicate cue type and reason): (A) to advance hips to EOB with pad and make cues for proper weight shifts Transfers Transfers: Yes Sit to Stand: 1: +2 Total assist;Patient percentage  (comment);From bed;From chair/3-in-1;With armrests (60%) Sit to Stand Details (indicate cue type and reason): (A) to initiate transfer with max cues for hand placement.  Pt tends to keep hands on RW with transfers Stand to Sit: 1: +2 Total assist;Patient percentage (comment);To toilet;To chair/3-in-1 (pt 60%) Stand to Sit Details: (A) to slowly descend to 3n1/recliner with max cues for hand placement Stand Pivot Transfers: 1: +2 Total assist;Patient percentage (comment) (pt 70%) Stand Pivot Transfer Details (indicate cue type and reason): Max cues for proper weight shifts to advance LE to recliner .  Pt able to take 3-4 steps with max cues for LE placement. Ambulation/Gait Ambulation/Gait: No Stairs: No Wheelchair Mobility Wheelchair Mobility: No  Posture/Postural Control Posture/Postural Control: No significant limitations Balance Balance Assessed: Yes Static Sitting Balance Static Sitting - Balance Support: Feet supported Static Sitting - Level of Assistance: 5: Stand by assistance Static Sitting - Comment/# of Minutes: stand by assist for safety Exercise    End of Session PT - End of Session Equipment Utilized During Treatment: Gait belt Activity Tolerance: Other (comment) (Limited due to anxiety) Patient left: in chair;with call bell in reach Nurse Communication: Mobility status for transfers General Behavior During Session: Other (comment) (anxious) Cognition: Impaired Cognitive Impairment: Pt very anxious prior to session and increase anxiety throughout session.  Pt for sustained attention with difficulty to problem solving  Katia Hannen 06/24/2011, 10:35 AM [phone]

## 2011-06-24 NOTE — Progress Notes (Signed)
Pt. with episode of tachycardia with heart rate in 140s while sitting on side of bed with therapist. Pulse would come down and then go back up. Per pt.'s son this also happened while pt. Was in ER. Pulse came back down to 70s and stayed. Pt. asymptomatic. Dr. Gwenlyn Saran made aware, no further orders received. Will monitor.

## 2011-06-24 NOTE — Consult Note (Signed)
[facility] CONSULT NOTE   Reason for Consult: Dizziness, possible stroke CC: dizziness, weakness   HPI:    Tina [name] is an 71 y.o. female with PMH significant for poorly controlled DM2, HTN, and HLD who presents as an admission for severe dizziness.  She describes a sensation of the room spinning that began before she went to bed; when she awoke a few hours later, she was still dizzy and unable to walk as a result of dizziness and "feeling unsteady." She reports a history of intermittent dizziness for 5 years and admits to intermittent tinnitus for the past 5-6 year as well as progressive hearing loss over the past 15-20 years.  She notes her dizziness is sometimes precipitated by bending down but also occurs while resting in bed and is occasionally associated with headache.  She denies any visual change/vision loss, syncope, tingling/numbess, or other associated complaint.  She admits to bilateral lower extremity weakness (R>L) that has been present for "awhile" and is relatively unchanged from baseline.  She states she does not take a daily ASA because of her kidney disease.  On admission, MRI was obtained to evaluate for the possibility of acute CVA.  The MRI results revealed an acute left paracentral pontine infarct in addition to remote right paracentral pontine and left thalamic infarct.  Past Medical History  Diagnosis Date  . Normal exercise sestamibi stress test 2007    [facility]  . Diabetes mellitus   . Hyperlipidemia   . Hypertension     Past Surgical History  Procedure Date  . Bladder surgery 1994  . Knee surgery as child  . Abdominal hysterectomy     Family History  Problem Relation Age of Onset  . Coronary artery disease Mother   . Diabetes Mother   . Heart disease Mother   . Diabetes Sister   . Hypertension Sister   . Cancer Father     Social History: patient lives alone.  She has one son living in [place]. She is a former  "social smoker"; quit 2yrs ago.  Denies EtOH and illicit drug use.  No Known Allergies  Medications:    I have reviewed the patient's current medications.  Review of Systems - General ROS: negative for - chills, fatigue, fever or hot flashes Hematological and Lymphatic ROS: negative for - bruising, fatigue, jaundice or pallor Endocrine ROS: negative for - hair pattern changes, hot flashes, mood swings or skin changes Respiratory ROS: negative for - cough, hemoptysis, orthopnea or wheezing Cardiovascular ROS: negative for - dyspnea on exertion, orthopnea, palpitations or shortness of breath Gastrointestinal ROS: negative for - abdominal pain, appetite loss, blood in stools, diarrhea or hematemesis Musculoskeletal ROS: negative for - joint pain, joint stiffness, joint swelling or muscle pain Neurological ROS: positive for - dizziness and gait disturbance, negative for numbness, slurred speech, confusion, behavioral change, bowel/bladder incontinence Dermatological ROS: negative for dry skin, pruritus and rash   Blood pressure 143/67, pulse 67, temperature 97.4 F (36.3 C), temperature source Oral, resp. rate 20, weight 247 lb 9.6 oz (112.311 kg), SpO2 93.00%.  VItal signs reviewed and stable. GEN: No apparent distress.  Alert and oriented x 3. Pt appears slightly anxious and is occasionally tearful during exam HEENT: head is autraumatic and normocephalic.  Neck is supple without palpable masses or lymphadenopathy.  No JVD or carotid bruits.  Vision intact.  EOMI.  PERRLA.  Sclerae anicteric.  Conjunctivae without pallor or injection. Mucous membranes are moist.  Oropharynx is without erythema, exudates, or other abnormal lesions.  Pt is edentulous. RESP:  Lungs are clear to ascultation bilaterally with good air movement.  No wheezes, ronchi, or rubs. CARDIOVASCULAR: regular rate, normal rhythm.  Clear S1, S2, no murmurs, gallops, or rubs. ABDOMEN: soft, non-tender, non-distended.  Bowels  sounds present in all quadrants and normoactive.  No palpable masses. EXT: warm and dry.  Diminished PT and DP pulses bilaterally.  No clubbing or cyanosis.  +2 edema right lower extremity, +1 in left lower extremity. SKIN: warm and dry with normal turgor.  No rashes or abnormal lesions observed. Neurologic Examination:  Mental Status: Alert, oriented, thought content appropriate.  Speech fluent without evidence of aphasia.  Able to follow 3 step commands without difficulty. Cranial Nerves: II: visual fields grossly normal, pupils equal, round, reactive to light and accommodation III,IV, VI: ptosis not present, extraocular muscles extra-ocular motions intact bilaterally V,VII: smile symmetric, facial light touch sensation normal bilaterally, facial strength normal and intact bilaterally VIII: hearing diminished bilaterally, R>>L IX,X: gag reflex present, uvula midline XI: trapezius strength/neck flexion strength normal bilaterally XII: tongue strength normal  Motor: Right : Upper extremity    Left:     Upper extremity 5/5 deltoid       5/5 deltoid 5/5 tricep      5/5 tricep 5/5 biceps      5/5 biceps  5/5wrist flexion     5/5 wrist flexion 5/5 wrist extension     5/5 wrist extension 5/5 hand grip      4/5 hand grip  Lower extremity     Lower extremity 5/5 hip flexor      5/5 hip flexor 5/5 quadricep      5/5 quadriceps  5/5 hamstrings     5/5 hamstrings 5/5 plantar flexion       5/5 plantar flexion 5/5 plantar extension     5/5 plantar extension Tone and bulk:normal tone throughout; no atrophy noted Sensory: Pinprick and light touch intact throughout, bilaterally Deep Tendon Reflexes: 2+ and symmetric throughout Plantars: Right: downgoing   Left: downgoing Cerebellar: normal finger-to-nose, normal rapid alternating movements  Lab Results  Component Value Date/Time   CHOL 150 06/24/2011  5:58 AM    Results for orders placed during the hospital encounter of 06/23/11 (from the  past 48 hour(s))  COMPREHENSIVE METABOLIC PANEL     Status: Abnormal   Collection Time   06/23/11 11:58 AM      Component Value Range Comment   Sodium 139  135 - 145 (mEq/L)    Potassium 4.3  3.5 - 5.1 (mEq/L)    Chloride 104  96 - 112 (mEq/L)    CO2 23  19 - 32 (mEq/L)    Glucose, Bld 303 (*) 70 - 99 (mg/dL)    BUN 31 (*) 6 - 23 (mg/dL)    Creatinine, Ser 1.61 (*) 0.50 - 1.10 (mg/dL)    Calcium 8.5  8.4 - 10.5 (mg/dL)    Total Protein 6.4  6.0 - 8.3 (g/dL)    Albumin 3.3 (*) 3.5 - 5.2 (g/dL)    AST 15  0 - 37 (U/L) HEMOLYSIS AT THIS LEVEL MAY AFFECT RESULT   ALT 11  0 - 35 (U/L)    Alkaline Phosphatase 89  39 - 117 (U/L)    Total Bilirubin 0.4  0.3 - 1.2 (mg/dL)    GFR calc non Af Denyse Dago  26 (*) >90 (mL/min)    GFR calc Af Amer 31 (*) >90 (mL/min)   CBC     Status: Abnormal   Collection Time   06/23/11 11:58 AM      Component Value Range Comment   WBC 7.4  4.0 - 10.5 (K/uL) WHITE COUNT CONFIRMED ON SMEAR   RBC 3.97  3.87 - 5.11 (MIL/uL)    Hemoglobin 11.3 (*) 12.0 - 15.0 (g/dL)    HCT 16.1 (*) 09.6 - 46.0 (%)    MCV 83.9  78.0 - 100.0 (fL)    MCH 28.5  26.0 - 34.0 (pg)    MCHC 33.9  30.0 - 36.0 (g/dL)    RDW 04.5  40.9 - 81.1 (%)    Platelets 169  150 - 400 (K/uL)   DIFFERENTIAL     Status: Normal   Collection Time   06/23/11 11:58 AM      Component Value Range Comment   Neutrophils Relative 69  43 - 77 (%)    Lymphocytes Relative 22  12 - 46 (%)    Monocytes Relative 7  3 - 12 (%)    Eosinophils Relative 2  0 - 5 (%)    Basophils Relative 0  0 - 1 (%)    Neutro Abs 5.2  1.7 - 7.7 (K/uL)    Lymphs Abs 1.6  0.7 - 4.0 (K/uL)    Monocytes Absolute 0.5  0.1 - 1.0 (K/uL)    Eosinophils Absolute 0.1  0.0 - 0.7 (K/uL)    Basophils Absolute 0.0  0.0 - 0.1 (K/uL)    RBC Morphology POLYCHROMASIA PRESENT     POCT I-STAT TROPONIN I     Status: Normal   Collection Time   06/23/11 12:56 PM      Component Value Range Comment   Troponin i, poc 0.00  0.00 - 0.08 (ng/mL)    Comment 3             HEMOGLOBIN A1C     Status: Abnormal   Collection Time   06/23/11  7:51 PM      Component Value Range Comment   Hemoglobin A1C 12.5 (*) <5.7 (%)    Mean Plasma Glucose 312 (*) <117 (mg/dL)   CBC     Status: Abnormal   Collection Time   06/23/11  9:53 PM      Component Value Range Comment   WBC 7.9  4.0 - 10.5 (K/uL)    RBC 4.11  3.87 - 5.11 (MIL/uL)    Hemoglobin 11.6 (*) 12.0 - 15.0 (g/dL)    HCT 91.4 (*) 78.2 - 46.0 (%)    MCV 83.7  78.0 - 100.0 (fL)    MCH 28.2  26.0 - 34.0 (pg)    MCHC 33.7  30.0 - 36.0 (g/dL)    RDW 95.6  21.3 - 08.6 (%)    Platelets 162  150 - 400 (K/uL)   CREATININE, SERUM     Status: Abnormal   Collection Time   06/23/11  9:53 PM      Component Value Range Comment   Creatinine, Ser 1.76 (*) 0.50 - 1.10 (mg/dL)    GFR calc non Af Amer 28 (*) >90 (mL/min)    GFR calc Af Amer 33 (*) >90 (mL/min)   TROPONIN I     Status: Normal   Collection Time   06/23/11  9:53 PM      Component Value Range Comment   Troponin I <0.30  <0.30 (ng/mL)  GLUCOSE, CAPILLARY     Status: Abnormal   Collection Time   06/23/11 10:24 PM      Component Value Range Comment   Glucose-Capillary 209 (*) 70 - 99 (mg/dL)   LIPID PANEL     Status: Abnormal   Collection Time   06/24/11  5:58 AM      Component Value Range Comment   Cholesterol 150  0 - 200 (mg/dL)    Triglycerides 130 (*) <150 (mg/dL)    HDL 25 (*) >86 (mg/dL)    Total CHOL/HDL Ratio 6.0      VLDL 73 (*) 0 - 40 (mg/dL)    LDL Cholesterol 52  0 - 99 (mg/dL)   GLUCOSE, CAPILLARY     Status: Abnormal   Collection Time   06/24/11  8:30 AM      Component Value Range Comment   Glucose-Capillary 217 (*) 70 - 99 (mg/dL)    Comment 1 Notify RN      Comment 2 Documented in Chart       Dg Chest 2 View  06/23/2011  *RADIOLOGY REPORT*  Clinical Data: Weakness and shortness of breath  CHEST - 2 VIEW  Comparison: 04/11/2009  Findings: Shallow inspiration.  Normal heart size and pulmonary vascularity.  There is atelectasis  or infiltration in the left lung base, less prominent than on the previous study.  No blunting of costophrenic angles.  No pneumothorax.  Degenerative changes in the thoracic spine.  IMPRESSION: Shallow inspiration with atelectasis or infiltration in the left lung base.  Original Report Authenticated By: Marlon Pel, M.D.   Ct Head Wo Contrast  06/23/2011  *RADIOLOGY REPORT*  Clinical Data: Headache, dizziness.  CT HEAD WITHOUT CONTRAST  Technique:  Contiguous axial images were obtained from the base of the skull through the vertex without contrast.  Comparison: 04/10/2009  Findings: Minimal chronic small vessel disease throughout the deep white matter.  No hydrocephalus.  No hemorrhage or acute infarction.  No mass lesion or extra-axial fluid collection.  No acute calvarial abnormality. Visualized paranasal sinuses and mastoids clear.  Orbital soft tissues unremarkable.  IMPRESSION: Minimal chronic small vessel disease. No acute intracranial abnormality.  Original Report Authenticated By: Cyndie Chime, M.D.   Mr Brain Wo Contrast  06/23/2011  *RADIOLOGY REPORT*  Clinical Data: Dizziness. Diabetic.  Hyperlipidemia.  MRI HEAD WITHOUT CONTRAST  Technique:  Multiplanar, multiecho pulse sequences of the brain and surrounding structures were obtained according to standard protocol without intravenous contrast.  Comparison: 06/23/2011 CT.  No comparison MR.  Findings: Questionable tiny acute left paracentral pontine infarct.  Remote right paracentral pontine infarct.  Remote tiny left thalamic infarct.  Mild to moderate small vessel disease type changes.  Mild global atrophy without hydrocephalus.  Major intracranial vascular structures are patent.  No intracranial mass lesion detected on this unenhanced exam.  No intracranial hemorrhage.  Partial opacification right mastoid air cells without obstructing lesions seen in the region of the posterior-superior nasopharynx causing eustachian tube dysfunction.   Minimal paranasal sinus mucosal thickening.  Incidentally noted are prominent Meckel's caves distorting the surrounding structures.  Partially empty sella incidentally noted.  Mild transverse ligament hypertrophy.  Mild spinal stenosis upper cervical spine without cord compression.  IMPRESSION: Questionable tiny acute left paracentral pontine infarct.  Remote right paracentral pontine infarct.  Remote tiny left thalamic infarct.  Mild to moderate small vessel disease type changes.  Mild global atrophy without hydrocephalus.  Please see above.  Original Report Authenticated By: Fuller Canada,  M.D.     Assessment/Plan:   Acute left paracentral pontine infarct:  Ms. Level is a 70 y/o F with numerous risk factors for stroke who presented with acute onset of vertigo and difficulty ambulating on the evening prior to admission.  MRI reveals an acute left paracentral pontine infarct; this is consistent with her complaint of acute-onset severe vertigo (although her history also suggests Meniere's disease which may explain her 5 year history of vertigo/tinnitus).  She is not currently on any antiplatelet therapy and is a candidate for ASA, however, she is very anxious about the idea of using ASA in the setting of CKD; will therefore start Plavix.  Anxiety/depression: patient is very tearful and anxious on exam. She admits to long-standing problems with her "nerves."  Will start her on lexapro to treat underlying anxiety and depression and d/c Cymbalta (SNRIs are second line therapy for treatment of depression/anxiety in the elder adult population and are typically initiated after SSRI failure.  SNRIs are associated with dose-dependent diastolic HTN and have been linked to higher rates of treatment failure in some studies).   Plan: - Agree with plan for 2D echo and carotid doppler, will follow up results - Will re-order MRA head to assess her intracranial vasculature - Will start Plavix 75mg  daily -  Consider increasing statin dose to achieve goal LDL of < 100 - Continue PT/OT - Reviewed the importance of improved blood glucose control - Lexapro 10mg  po q day - Meclizine 12.5mg  po TID prn dizziness/vertigo   Nelda Bucks, PGY-3 06/24/2011, 9:31 AM

## 2011-06-24 NOTE — Progress Notes (Signed)
Discussed in rounds.  She has been seen earlier today by two attendings: Dr. Jennette Kettle who did her H&PE and Dr. Deirdre Priest who is her primary care provider.  Agree with management as outlined by Dr. Konrad Dolores.

## 2011-06-24 NOTE — Progress Notes (Signed)
PCP Appreciate care from FMTS team and Neurology  Ms Dingus seems to be at her baseline with regards to anxiety.   She has baseline balance and vertigo problems but she describes a severe and sudden onset likely due to CVA.  She has not had CHF in the past and I suspect she had more dependent venous insufficiency edema.  In the past she has had significant lipidedema but not pitting.  Agree with checking BNP and echo.  Would use diuretics sparingly if at all unless evidence of total fluid overload.  Her renal insufficiency is chronic and likely related to diabetes.  I would consider restarting her acei for renal protection and blood pressure control after her acute CVA period   Her diabetes has been poorly controlled since stopping metformin due to her renal disease.  We have been attempting to start her on insulin.  Training her to do this in the hospital might be a good approach  Her Anxiety is long standing - Agree with starting an antianxiety SSRI or SNRI.   The class differences between these drugs are likely much less than individual response and given her chronic pain issues an SNRI might be useful.  She lives alone and [facility] has been in the process of getting her assistance.  She will need to ambulate fairly well before discharge so rehab might be a good option for her  Masai Kidd L

## 2011-06-24 NOTE — Progress Notes (Signed)
Family Medicine Teaching Service [facility] Progress Note  Patient name: Tina [name] Medical record number: [id number] Date of birth: 1940-10-23 Age: 71 y.o. Gender: female    LOS: 1 day   Primary Care Provider: Carney Living, MD, MD  Overnight Events:  NAEO. Feeling well this morning. Tolerating diet w/o aspiration. Complains of dizziness on standing/sitting.   Denies CP, SOB, nausea, diarrhea, constipation, fever, syncope   Objective: Vital signs in last 24 hours: Temp:  [97.2 F (36.2 C)-99 F (37.2 C)] 97.4 F (36.3 C) (02/12 0824) Pulse Rate:  [62-82] 67  (02/12 0824) Resp:  [12-26] 20  (02/12 0824) BP: (114-180)/(65-83) 143/67 mmHg (02/12 0824) SpO2:  [91 %-100 %] 93 % (02/12 0824) Weight:  [247 lb 9.6 oz (112.311 kg)-249 lb (112.946 kg)] 247 lb 9.6 oz (112.311 kg) (02/12 0500)  Wt Readings from Last 3 Encounters:  06/24/11 247 lb 9.6 oz (112.311 kg)  06/17/11 252 lb 4.8 oz (114.443 kg)  06/02/11 251 lb (113.853 kg)     Current Facility-Administered Medications  Medication Dose Route Frequency Provider Last Rate Last Dose  . 0.9 %  sodium chloride infusion  250 mL Intravenous PRN Barnabas Lister, MD      . acetaminophen (TYLENOL) tablet 650 mg  650 mg Oral Q8H Denny Levy, MD   650 mg at 06/24/11 [phone]  . aspirin suppository 300 mg  300 mg Rectal Daily Barnabas Lister, MD   300 mg at 06/23/11 2257   Or  . aspirin tablet 325 mg  325 mg Oral Daily Ivy Tye Savoy, MD      . atenolol (TENORMIN) tablet 25 mg  25 mg Oral BID Barnabas Lister, MD   25 mg at 06/23/11 2257  . cloNIDine (CATAPRES) tablet 0.1 mg  0.1 mg Oral BID Barnabas Lister, MD   0.1 mg at 06/23/11 2257  . DULoxetine (CYMBALTA) DR capsule 20 mg  20 mg Oral Daily Shelly Flatten, MD      . gabapentin (NEURONTIN) capsule 100 mg  100 mg Oral TID Barnabas Lister, MD   100 mg at 06/23/11 2257  . heparin injection 5,000 Units  5,000 Units Subcutaneous Q8H Ivy Tye Savoy, MD   5,000 Units at 06/24/11 1191  .  hydrALAZINE (APRESOLINE) injection 10 mg  10 mg Intravenous Q4H PRN Ivy Tye Savoy, MD      . HYDROcodone-acetaminophen (NORCO) 5-325 MG per tablet 1 tablet  1 tablet Oral Q4H PRN Barnabas Lister, MD      . insulin aspart (novoLOG) injection 0-15 Units  0-15 Units Subcutaneous TID WC Ivy Tye Savoy, MD   5 Units at 06/24/11 [phone]  . insulin aspart (novoLOG) injection 0-5 Units  0-5 Units Subcutaneous QHS Ivy de Lawson Radar, MD      . insulin glargine (LANTUS) injection 10 Units  10 Units Subcutaneous Daily Shelly Flatten, MD   10 Units at 06/24/11 [phone]  . meclizine (ANTIVERT) tablet 50 mg  50 mg Oral Once Grant Fontana, [place]   50 mg at 06/23/11 1533  . ondansetron (ZOFRAN) injection 4 mg  4 mg Intravenous Q8H PRN Ivy Tye Savoy, MD      . ondansetron [facility]) tablet 4 mg  4 mg Oral Q8H PRN Ivy Tye Savoy, MD      . pantoprazole (PROTONIX) EC tablet 40 mg  40 mg Oral Daily Ivy Tye Savoy, MD      .  senna-docusate (Senokot-S) tablet 1 tablet  1 tablet Oral QHS PRN Barnabas Lister, MD      . simvastatin (ZOCOR) tablet 40 mg  40 mg Oral QHS Barnabas Lister, MD   40 mg at 06/23/11 2257  . sodium chloride 0.9 % bolus 500 mL  500 mL Intravenous Once Grant Fontana, PA   500 mL at 06/23/11 1528  . sodium chloride 0.9 % injection 3 mL  3 mL Intravenous Q12H Ivy Tye Savoy, MD   3 mL at 06/23/11 2306  . sodium chloride 0.9 % injection 3 mL  3 mL Intravenous PRN Ivy Tye Savoy, MD      . DISCONTD: acetaminophen (TYLENOL) CR tablet 650 mg  650 mg Oral Q8H Ivy de Lawson Radar, MD      . DISCONTD: furosemide (LASIX) injection 40 mg  40 mg Intravenous Q8H Ivy Tye Savoy, MD   40 mg at 06/24/11 0639     PE: Gen:NAD HEENT: MMM CV: RRR RUE:AVWU Ext/Musc: 3+ LE edema Neuro: CN grossly intact. Purposeful extremity movement. No dizziness on rotation of head  Labs/Studies:  CBG (last 3)   Basename 06/24/11 0830 06/23/11 2224  GLUCAP 217* 209*   Lipid Panel     Component Value Date/Time   CHOL 150 06/24/2011  0558   TRIG 364* 06/24/2011 0558   HDL 25* 06/24/2011 0558   CHOLHDL 6.0 06/24/2011 0558   VLDL 73* 06/24/2011 0558   LDLCALC 52 06/24/2011 0558     Assessment/Plan: 71 yo obese F with history of Diabetes, CKD Stage 3 and HTN who presents with dizziness and vertigo-like symptoms that began 2 days ago.   1. Dizziness/ Vertigo - Possible CVA of paracentral pontine area (MRI confirmed) vs orthostatics vs BPV. Improving. Only when going from lying to sitting/standing.  - on Tele - Stroke protocol - PT/OT consult  - Orthostatics  - carotid dopplers  - 2D Echo  2. Bilateral Pitting Edema: Likely multifactorial. Vaseline venous insuficiency w/ renal insufficiency and HTN vs CHF.  - Will give trial of Lasix 40 IV. Will monitor diureses and consider scheduled Lasix  - Monitor daily BMET for Creatinine (baseline appears to be 1.7-1.9), currently 1.73  - Patient does not have diagnosis of CHF. Will check Echo.  - Daily weights and strict I/O   3. HTN: well controlled. Slightly elevated this am.  - Will continue Atenolol and Clonidine  - Hold Enalapril and HCTZ due to renal function  - Hydralizine as needed for systolics > 180, diastolic > 100   4. Diabetes: Hgb A1c 12.2 - Monitor CBG  - Diabetes education - Lantus 10   5. GERD: No current complaints - Continue Protonix   6. Depression: Pt continues to be anxious and somewhat depressed.  - Cymbalta 20mg  for depression/anxiety/pain as pt w/ chronic pain complaints.  7. HLD  - Continue home statin   8. FEN/GI: Diabetic diet as tolerated.   9. PPx: Heparin.   10. Dispo: Pending clinical improvement.  Signed: Shelly Flatten, MD Family Medicine Resident PGY-1 06/24/2011 9:33 AM

## 2011-06-24 NOTE — Progress Notes (Signed)
Inpatient Diabetes Program Recommendations  AACE/ADA: New Consensus Statement on Inpatient Glycemic Control (2009)  Target Ranges:  Prepandial:   less than 140 mg/dL      Peak postprandial:   less than 180 mg/dL (1-2 hours)      Critically ill patients:  140 - 180 mg/dL   Reason for Visit: Referral received.  Note plans for possible discharge on insulin.  Will order insulin starter kit using insulin pen.  Discussed with Patient.  She is open to taking insulin and eager to learn.  Discussed with RN so that bedside teaching can be done.  Will follow-up on 06/25/11.  Thanks,

## 2011-06-24 NOTE — Progress Notes (Signed)
06-24-11 UR completed. Ronny Flurry RN BSN

## 2011-06-24 NOTE — Progress Notes (Signed)
Speech Language/Pathology   Attempted to initiate speech-cognitive assessment.  Pt. on bedside commode and now having ECHO in room. PLAN:  Will reattempt eval today as schedule allows  Tina [name] M.Ed [employer] [phone]  06/24/2011

## 2011-06-24 NOTE — Progress Notes (Signed)
Received referral for possible HH needs, and note that pt had HH however could not remember name of agency, then noted reference to [facility]).  This CM contacted [facility] and spoke with RN who is attempting to follow this pt, Tina [name], however this pt cancelled first appointment on 02/07 and second appointment was scheduled for today 06/24/2011. If pt is to d/c to home, hospital CM will setup home health as needed and [facility] will continue to follow for case management in the community. Noted PT recommendation for short term SNF.  Will continue to follow.  Starlyn Skeans RN MPH Case Manager [phone]

## 2011-06-24 NOTE — Progress Notes (Signed)
*  PRELIMINARY RESULTS* Vascular Ultrasound Carotid Duplex (Doppler) has been completed.  Preliminary findings: Bilaterally No significant ICA stenosis with antegrade vertebral flow. Right mid- distal ICA shows increased velocities, however tortuosity of vessel and lack of plaque formation does not support stenosis.  Farrel Demark RDMS 06/24/2011, 11:15 AM

## 2011-06-25 ENCOUNTER — Inpatient Hospital Stay (HOSPITAL_COMMUNITY): Payer: Medicare HMO

## 2011-06-25 LAB — BASIC METABOLIC PANEL
Calcium: 8.5 mg/dL (ref 8.4–10.5)
GFR calc non Af Amer: 23 mL/min — ABNORMAL LOW (ref >90)
Glucose, Bld: 237 mg/dL — ABNORMAL HIGH (ref 70–99)
Potassium: 3.8 mEq/L (ref 3.5–5.1)
Sodium: 142 mEq/L (ref 135–145)

## 2011-06-25 LAB — GLUCOSE, CAPILLARY
Glucose-Capillary: 234 mg/dL — ABNORMAL HIGH (ref 70–99)
Glucose-Capillary: 148 mg/dL — ABNORMAL HIGH (ref 70–99)

## 2011-06-25 MED ORDER — TRAZODONE HCL 50 MG PO TABS
50.0000 mg | ORAL_TABLET | Freq: Once | ORAL | Status: AC
  Administered 2011-06-25: 50 mg via ORAL
  Filled 2011-06-25: qty 1

## 2011-06-25 MED ORDER — ENALAPRIL MALEATE 10 MG PO TABS
10.0000 mg | ORAL_TABLET | Freq: Two times a day (BID) | ORAL | Status: DC
  Administered 2011-06-25 – 2011-06-26 (×3): 10 mg via ORAL
  Filled 2011-06-25 (×4): qty 1

## 2011-06-25 MED ORDER — DULOXETINE HCL 20 MG PO CPEP
20.0000 mg | ORAL_CAPSULE | Freq: Every day | ORAL | Status: DC
  Administered 2011-06-25 – 2011-06-26 (×2): 20 mg via ORAL
  Filled 2011-06-25 (×2): qty 1

## 2011-06-25 MED ORDER — INSULIN GLARGINE 100 UNIT/ML ~~LOC~~ SOLN
20.0000 [IU] | Freq: Every day | SUBCUTANEOUS | Status: DC
  Administered 2011-06-25 – 2011-06-26 (×2): 20 [IU] via SUBCUTANEOUS

## 2011-06-25 NOTE — Discharge Summary (Signed)
Family Medicine Resident Discharge Summary  Patient ID: Tina [name] [id number] 71 y.o. July 03, 1940  Admit date: 06/23/2011  Discharge date and time: 06/26/11  Admitting Physician: Denny Levy, MD   Discharge Physician: Doralee Albino, MD  Admission Diagnoses: Vertigo [780.4] CVA (cerebral infarction) [434.91]   Discharge Diagnoses: CVA  Admission Condition: good  Discharged Condition: good  Indication for Admission: Concern for stroke  Hospital Course: 71 yo obese F with history of Diabetes, CKD Stage 3 and HTN who presented with dizziness and vertigo-like symptoms that began the night prior to admission.    Vertigo/Dizziness. No episodes of vertigo during admission. MRI on admission showed a L paracenral pontine infarct and a remote L thalamic infarct. Neuro was consulted and stroke protocol started. Dizziness/lightheadedness noted during admission w/ orthostatic changes. Orthostatic BPs noted a SBP drop from 175 to 111 mm/Hg when going from lying to standing these symptoms improved as BP improved. Pt started on Plavix during admission but changed to ASA 81mg  at time of DC. No focal or global neurological deficits noted during hospital admission. PT/OT worked w/ pt during stay and pt set up to receive home health as her mobility is somewhat limited (chronic problem).    Chronic Kidney disease: Pt noted to have CKD stage III. W/ a baseline Cr of around 2. Cr was closely monitored during admission, w/o a significant change. Medical therapies were closely monitored to titrated to account for her calculated CrCl.  HTN: Pt BP initially very fluid on admission. Initially HCTZ and Enalapril held due to CKD. After stabilization of HTN, Atenolol was changed for enalapril w/ good BP control. Enalapril preferred but will need to closely watch Cr.   Psych: Pt noted w/ h/o depression. Tearful on admission. Cymbalta started during admission to assist with depression and chronic pain. While  pt not likely to feel effects after ~2 days, pt noted to be happy and smiling at time of DC.   DM: Pt w/o home insulin regimen noted at admission. Discussed DM w/ pt and pt willing to try insulin injections. Started on Lantus 10 and increased to 20 units Qam w/ good control. Amaryl DCd at time of admission. Diabetes education started during admission.  Consults: neurology, stroke team  Significant Diagnostic Studies:   HgbA1c 12.5  Head CT: Minimal chronic small vessel disease. No acute intracranial abnormality  Head MRI: Questionable tiny acute left paracentral pontine infarct.  Remote right paracentral pontine infarct. Remote tiny left thalamic infarct.  Mild to moderate small vessel disease type changes.  Mild global atrophy without hydrocephalus.  Troponins: Negative  Lipid Panel     Component Value Date/Time   CHOL 150 06/24/2011 0558   TRIG 364* 06/24/2011 0558   HDL 25* 06/24/2011 0558   CHOLHDL 6.0 06/24/2011 0558   VLDL 73* 06/24/2011 0558   LDLCALC 52 06/24/2011 0558   Carotid doppler:  Bilaterally no significant internal carotid artery stenosis with antegrade vertebral flow. Right mid- distal internal carotid shows increased velocities, however it is atortuous vessel and lack of plaque formation does not support stenosis. Incidental finding: Left thyroid- multiple mixed echogenicity thyroid nodules.  Echo:  - Left ventricle: The cavity size was normal. Wall thickness was normal. Systolic function was normal. The estimated ejection fraction was in the range of 55% to 65%. Wall motion was normal; there were no regional wall motion abnormalities. Doppler parameters are consistent with abnormal left ventricular relaxation (grade 1 diastolic dysfunction). Doppler parameters are consistent with elevated mean left atrial filling pressure. -  Pulmonary arteries: Systolic pressure could not be accurately estimated. - Inferior vena cava: The vessel was normal in size;  the respirophasic diameter changes were in the normal range (= 50%); findings are consistent with normal central venous pressure. - No evidence of valve disease.    Discharge Exam: Gen:NAD  HEENT: MMM  CV: RRR  ZOX:WRUE  Ext/Musc: 3+ LE edema  Neuro: CN grossly intact. Purposeful extremity movement. Strength 2+ bilaterally in UE and 1+ in LE. No dizziness on rotation of head. No nystagmus, CN grossly intact.   Disposition: home  Patient Instructions:    Medication List  As of 06/26/2011 12:42 PM   START taking these medications         aspirin EC 81 MG tablet   Take 1 tablet (81 mg total) by mouth daily.      DULoxetine 20 MG capsule   Commonly known as: CYMBALTA   Take 1 capsule (20 mg total) by mouth daily.      insulin glargine 100 UNIT/ML injection   Commonly known as: LANTUS   Inject 20 Units into the skin daily.      Insulin Syringe-Needle U-100 31G X 15/64" 1 ML Misc   20 Units by Does not apply route every morning.      meclizine 12.5 MG tablet   Commonly known as: ANTIVERT   Take 1 tablet (12.5 mg total) by mouth 3 (three) times daily as needed for dizziness or nausea.         CONTINUE taking these medications         acetaminophen 650 MG CR tablet   Commonly known as: TYLENOL      ASCENSIA CONTOUR TEST VI      diclofenac sodium 1 % Gel   Commonly known as: VOLTAREN      enalapril 20 MG tablet   Commonly known as: VASOTEC      esomeprazole 40 MG capsule   Commonly known as: NEXIUM      gabapentin 100 MG capsule   Commonly known as: NEURONTIN      * HYDROcodone-acetaminophen 5-500 MG per tablet   Commonly known as: VICODIN      * HYDROcodone-acetaminophen 5-500 MG per tablet   Commonly known as: VICODIN      ondansetron 4 MG tablet   Commonly known as: ZOFRAN      simvastatin 40 MG tablet   Commonly known as: ZOCOR     * Notice: This list has 2 medication(s) that are the same as other medications prescribed for you. Read the directions  carefully, and ask your doctor or other care provider to review them with you.       STOP taking these medications         atenolol 25 MG tablet      cloNIDine 0.1 MG tablet      glimepiride 2 MG tablet      hydrochlorothiazide 50 MG tablet      sitaGLIPtin 50 MG tablet          Where to get your medications    These are the prescriptions that you need to pick up. We sent them to a specific pharmacy, so you will need to go there to get them.   [facility] - [place], [place] - [address]    [address] [place] [address]    Phone: [phone]        aspirin EC 81 MG tablet   DULoxetine 20 MG capsule  insulin glargine 100 UNIT/ML injection   Insulin Syringe-Needle U-100 31G X 15/64" 1 ML Misc   meclizine 12.5 MG tablet             Activity: activity as tolerated Diet: diabetic diet Wound Care: none needed  Follow-up with Dr. Deirdre Priest on 25th at 3:15 and Dr. Raymondo Band on 19th.  Follow-up Items: 1. New DM regimen 2. Vertigo/dizziness/CVA 2. Thyroid nodules noted on carotid doppler  Signed: Shelly Flatten, MD Family Medicine Resident PGY-1 06/26/2011 12:42 PM

## 2011-06-25 NOTE — Progress Notes (Addendum)
a  CARE MANAGEMENT NOTE 06/25/2011  Patient:  KYRENE, LONGAN   Account Number:  [id number]  Date Initiated:  06/24/2011  Documentation initiated by:  Ronny Flurry  Subjective/Objective Assessment:   DX: diziness  Pt referred to [employer] [facility] prior to this admission, however has not been able to have first appointment.     Action/Plan:   lives alone , has son in [facility] will continue to follow, CM will assist with any HH need, noted PT recommending short term SNF.   Anticipated DC Date:  06/27/2011   Anticipated DC Plan:  HOME W HOME HEALTH SERVICES         [facility] Choice  DURABLE MEDICAL EQUIPMENT  HOME HEALTH   Choice offered to / List presented to:  C-1 Patient   DME arranged  Levan Hurst      DME agency  [facility]     [facility] arranged  HH-1 RN  HH-2 PT  HH-4 NURSE'S AIDE      HH agency  [facility]   Status of service:  Completed, signed off Medicare Important Message given?   (If response is "NO", the following Medicare IM given date fields will be blank) Date Medicare IM given:   Date Additional Medicare IM given:    Discharge Disposition:  HOME W HOME HEALTH SERVICES  Per UR Regulation:  Reviewed for med. necessity/level of care/duration of stay  Comments:  DX:  Dizziness/ Vertigo - Sudden onset now improving. MRI shows questionable tiny acute left paracentral pontine infarct. Differential includes hyperglycemia, TIA vs CVA. No other neurologic deficits noted   06/26/2011 Pt requesting 3:1 commode for home use, will add to DME order.  CRoyal RN MPH

## 2011-06-25 NOTE — Progress Notes (Signed)
Family Medicine Teaching Service [facility] Progress Note  Patient name: Tina [name] Medical record number: [id number] Date of birth: 01-Dec-1940 Age: 71 y.o. Gender: female    LOS: 2 days   Primary Care Provider: Carney Living, MD, MD  Overnight Events:  NAEO. No episodes of dizziness. Feels well this morning. Increased weakness but otherwise back to baseline per pt.   Objective: Vital signs in last 24 hours: Temp:  [97.4 F (36.3 C)-98.7 F (37.1 C)] 97.8 F (36.6 C) (02/13 0536) Pulse Rate:  [66-167] 69  (02/13 0536) Resp:  [18-20] 20  (02/13 0536) BP: (111-175)/(67-85) 127/72 mmHg (02/13 0536) SpO2:  [92 %-97 %] 94 % (02/13 0536) Weight:  [246 lb 4.8 oz (111.721 kg)] 246 lb 4.8 oz (111.721 kg) (02/13 0500)  Wt Readings from Last 3 Encounters:  06/25/11 246 lb 4.8 oz (111.721 kg)  06/17/11 252 lb 4.8 oz (114.443 kg)  06/02/11 251 lb (113.853 kg)     Current Facility-Administered Medications  Medication Dose Route Frequency Provider Last Rate Last Dose  . 0.9 %  sodium chloride infusion  250 mL Intravenous PRN Barnabas Lister, MD      . acetaminophen (TYLENOL) tablet 650 mg  650 mg Oral Q8H Denny Levy, MD   650 mg at 06/25/11 0523  . atenolol (TENORMIN) tablet 25 mg  25 mg Oral BID Barnabas Lister, MD   25 mg at 06/24/11 2138  . cloNIDine (CATAPRES) tablet 0.1 mg  0.1 mg Oral BID Barnabas Lister, MD   0.1 mg at 06/24/11 2138  . clopidogrel (PLAVIX) tablet 75 mg  75 mg Oral Q breakfast Nelda Bucks, MD      . escitalopram (LEXAPRO) tablet 10 mg  10 mg Oral Daily Nelda Bucks, MD   10 mg at 06/24/11 1259  . Flexpen Starter Kit 1 kit  1 kit Other Once Denny Levy, MD   1 kit at 06/24/11 1700  . gabapentin (NEURONTIN) capsule 100 mg  100 mg Oral TID Barnabas Lister, MD   100 mg at 06/24/11 2138  . heparin injection 5,000 Units  5,000 Units Subcutaneous Q8H Ivy Tye Savoy, MD   5,000 Units at 06/25/11 3244  . hydrALAZINE (APRESOLINE) injection 10 mg  10 mg Intravenous Q4H  PRN Ivy Tye Savoy, MD      . HYDROcodone-acetaminophen (NORCO) 5-325 MG per tablet 1 tablet  1 tablet Oral Q4H PRN Barnabas Lister, MD      . insulin aspart (novoLOG) injection 0-15 Units  0-15 Units Subcutaneous TID WC Ivy Tye Savoy, MD   3 Units at 06/24/11 1717  . insulin aspart (novoLOG) injection 0-5 Units  0-5 Units Subcutaneous QHS Barnabas Lister, MD   4 Units at 06/24/11 2139  . insulin glargine (LANTUS) injection 10 Units  10 Units Subcutaneous Daily Shelly Flatten, MD   10 Units at 06/24/11 [phone]  . meclizine (ANTIVERT) tablet 12.5 mg  12.5 mg Oral TID PRN Nelda Bucks, MD      . ondansetron [facility]) injection 4 mg  4 mg Intravenous Q8H PRN Ivy Tye Savoy, MD      . ondansetron [facility]) tablet 4 mg  4 mg Oral Q8H PRN Ivy Tye Savoy, MD      . pantoprazole (PROTONIX) EC tablet 40 mg  40 mg Oral Daily Ivy Tye Savoy, MD   40 mg at 06/24/11 1045  . senna-docusate (Senokot-S) tablet 1 tablet  1 tablet Oral QHS PRN Barnabas Lister, MD      . simvastatin (ZOCOR) tablet 40 mg  40 mg Oral QHS Barnabas Lister, MD   40 mg at 06/24/11 2138  . sodium chloride 0.9 % injection 3 mL  3 mL Intravenous Q12H Ivy Tye Savoy, MD   3 mL at 06/24/11 2145  . sodium chloride 0.9 % injection 3 mL  3 mL Intravenous PRN Ivy Tye Savoy, MD      . traZODone (DESYREL) tablet 50 mg  50 mg Oral Once Edd Arbour, MD   50 mg at 06/24/11 2258  . DISCONTD: aspirin suppository 300 mg  300 mg Rectal Daily Barnabas Lister, MD   300 mg at 06/23/11 2257  . DISCONTD: aspirin tablet 325 mg  325 mg Oral Daily Ivy Tye Savoy, MD   325 mg at 06/24/11 1046  . DISCONTD: DULoxetine (CYMBALTA) DR capsule 20 mg  20 mg Oral Daily Shelly Flatten, MD         PE: Gen:NAD  HEENT: MMM  CV: RRR  ZOX:WRUE  Ext/Musc: 3+ LE edema  Neuro: CN grossly intact. Purposeful extremity movement. Strength 2+ bilaterally in UE and 1+ in LE. No dizziness on rotation of head. No nystagmus  Labs/Studies:  Basic Metabolic Panel:    Component Value  Date/Time   NA 142 06/25/2011 0555   K 3.8 06/25/2011 0555   CL 107 06/25/2011 0555   CO2 24 06/25/2011 0555   BUN 35* 06/25/2011 0555   CREATININE 2.05* 06/25/2011 0555   CREATININE 2.07* 03/21/2011 1559   GLUCOSE 237* 06/25/2011 0555   CALCIUM 8.5 06/25/2011 0555   CBG (last 3)   Basename 06/25/11 0703 06/24/11 2115 06/24/11 1637  GLUCAP 234* 311* 200*    Echo: Left ventricle: The cavity size was normal. Wall thickness was normal. Systolic function was normal. The estimated ejection fraction was in the range of 55% to 65%. Wall motion was normal; there were no regional wall motion abnormalities. Doppler parameters are consistent with abnormal left ventricular relaxation (grade 1 diastolic dysfunction). Doppler parameters are consistent with elevated mean left atrial filling pressure.  - Pulmonary arteries: Systolic pressure could not be accurately estimated. - Inferior vena cava: The vessel was normal in size; the respirophasic diameter changes were in the normal range (= 50%); findings are consistent with normal central venous pressure.  Carotid Dopplers: Bilaterally no significant internal carotid artery stenosis with antegrade vertebral flow. Right mid- distal internal carotid shows increased velocities, however it is atortuous vessel and lack of plaque formation does not support stenosis. Incidental finding: Left thyroid- multiple mixed echogenicity thyroid nodules.   Assessment/Plan: 71 yo obese F with history of Diabetes, CKD Stage 3 and HTN who presents with dizziness and vertigo-like symptoms that began 2 days ago.   1. Dizziness/ Vertigo - Possible CVA of paracentral pontine area (MRI confirmed) vs orthostatics vs BPV. Only when going from lying to sitting/standing. PCP made team aware that pt has some baseline vertigo. Improved significantly since admission. Neuro signed off. Stroke team following.  On Tele w/o event since admission. Carotid dopplers reasurring as above. Echo as  above.  - Continue Tele  - Stroke protocol  - MRA per Stroke team - Continue PT/OT (recommending pt SNF)  - Case mgt for [facility]  - Nursing staff to ambulate pt BID  2. Bilateral Pitting Edema: Likely multifactorial. Baseline venous insuficiency w/ renal insufficiency and HTN. CHF unlikely due  to Echo. Lasix 40mg  given x1 yesterday, but I/O not accurately recorded. Slight bump in Cr today - continue to hold diuresis due to AonCRF  - Monitor daily BMET for Creatinine (baseline appears to be 1.7-1.9), currently 2.05 - Daily weights and strict I/O   3. HTN: well controlled. Slightly elevated this am.  - Will continue Atenolol and Clonidine  - Hold Enalapril and HCTZ due to renal function. Will discuss with team as pt likely to benefit from ACEi.  - Hydralizine as needed for systolics > 180, diastolic > 100   4. Diabetes: Hgb A1c 12.2 . BG continues to be elevated.  - Monitor CBG  - Continue Diabetes education. As pt amenable to using injectable medications - Lantus 20  5. GERD: No current complaints  - Continue Protonix   6. Depression: Pt continues to be anxious and somewhat depressed. Pt placed on Lexapro yesterday by Neuro and cymbalta was DCd - Change Lexapro to  Cymbalta 20mg  for depression/anxiety/pain as pt w/ chronic pain complaints.   7. HLD  - Continue home statin   8. FEN/GI: Diabetic diet as tolerated.   9. PPx: Plavix  10. Dispo: Pending clinical improvement.  Signed: Shelly Flatten, MD Family Medicine Resident PGY-1 06/25/2011 8:05 AM

## 2011-06-25 NOTE — Progress Notes (Signed)
Physical Therapy Treatment Patient Details Name: Tina [name] MRN: [id number] DOB: [birth year] Today's Date: 06/25/2011  PT Assessment/Plan  PT - Assessment/Plan Comments on Treatment Session: Pt able to ambulate this session but continues to need (A) for transfers and ambulation.  Therefore recommend SNF at d/c.  However pt continues to state she will be able to go home after educating the pt further therapy need for safe d/c eventually home. PT Plan: Discharge plan remains appropriate;Frequency remains appropriate PT Frequency: Min 4X/week Follow Up Recommendations: Skilled nursing facility Equipment Recommended: Defer to next venue PT Goals  Acute Rehab PT Goals PT Goal Formulation: With patient Time For Goal Achievement: 2 weeks Pt will go Supine/Side to Sit: with supervision PT Goal: Supine/Side to Sit - Progress: Progressing toward goal Pt will go Sit to Supine/Side: with supervision PT Goal: Sit to Supine/Side - Progress: Progressing toward goal Pt will go Sit to Stand: with supervision PT Goal: Sit to Stand - Progress: Progressing toward goal Pt will go Stand to Sit: with supervision PT Goal: Stand to Sit - Progress: Progressing toward goal Pt will Transfer Bed to Chair/Chair to Bed: with supervision PT Transfer Goal: Bed to Chair/Chair to Bed - Progress: Progressing toward goal Pt will Stand: with supervision;1 - 2 min;with bilateral upper extremity support PT Goal: Stand - Progress: Progressing toward goal Pt will Ambulate: 51 - 150 feet;with rolling walker;with supervision PT Goal: Ambulate - Progress: Progressing toward goal  PT Treatment Precautions/Restrictions  Precautions Precautions: Fall Restrictions Weight Bearing Restrictions: No Mobility (including Balance) Bed Mobility Bed Mobility: Yes Rolling Right: 4: Min assist Rolling Right Details (indicate cue type and reason): (A) to initiate roll with cues for hand placement.  Pt needs extra time Right  Sidelying to Sit: 4: Min assist;HOB flat;With rails Right Sidelying to Sit Details (indicate cue type and reason): (A) to elevate shoulders OOB with cues for hand placement Sitting - Scoot to Edge of Bed: With rail;3: Mod assist Sitting - Scoot to Edge of Bed Details (indicate cue type and reason): (A) to advance hips to EOB with pad and make cues for proper weight shifts Transfers Transfers: Yes Sit to Stand: 3: Mod assist Sit to Stand Details (indicate cue type and reason):   (A) to initiate transfer with max cues for hand placement. Pt tends to keep hands on RW with transfers  Stand to Sit: 3: Mod assist;To chair/3-in-1 Stand to Sit Details:   (A) to slowly descend to 3n1/recliner with max cues for hand placement  Stand Pivot Transfers: 3: Mod assist;From elevated surface Stand Pivot Transfer Details (indicate cue type and reason): Max cues for proper weight shifts to advance LE to 3n1 . Pt able to take 3-4 steps with max cues for LE placement. Ambulation/Gait Ambulation/Gait: Yes Ambulation/Gait Assistance: 4: Min assist Ambulation/Gait Assistance Details (indicate cue type and reason): (A) for RW placement and maintain balance especially with turns Ambulation Distance (Feet): 75 Feet Assistive device: Rolling walker Gait Pattern: Step-through pattern;Shuffle;Trunk flexed Stairs: No Wheelchair Mobility Wheelchair Mobility: No  Posture/Postural Control Posture/Postural Control: No significant limitations Balance Balance Assessed: Yes Static Sitting Balance Static Sitting - Balance Support: Feet supported Static Sitting - Level of Assistance: 5: Stand by assistance Exercise    End of Session PT - End of Session Equipment Utilized During Treatment: Gait belt Activity Tolerance: Patient tolerated treatment well Patient left: in chair;with call bell in reach Nurse Communication: Mobility status for transfers;Mobility status for ambulation General Behavior During Session:   (continues to be  slightly anxious) Cognition: Impaired Cognitive Impairment: Pt continues to anxious however pt seems to have impaired safety judgement.  Eyal Greenhaw 06/25/2011, 1:34 PM [phone]

## 2011-06-25 NOTE — Progress Notes (Signed)
Patient is currently active with [facility].  There have been some challenges with scheduling her visit encounters at home.  In addition, the patient has expressed on multiple occasions that she does not feel comfortable going to her pcp office.  She is concerned that she will have frequent adjustment in medications and treatments if she goes to the office regularly.  Please contact me for any questions at [phone]

## 2011-06-25 NOTE — Progress Notes (Signed)
Occupational Therapy Treatment Patient Details Name: Tina [name] MRN: [id number] DOB: 15-May-1940 Today's Date: 06/25/2011  OT Assessment/Plan OT Assessment/Plan Comments on Treatment Session: Pt demonstrating increased activity tolerance today.  Continues to require assist for BADLs and functional transfers, therefore continue to recommend d/c to SNF. Pt reports that she can take care of herself at home after educated on need to increase safety and independence with ADLS and transfers in order to have a safe d/c home.   OT Plan: Discharge plan remains appropriate OT Frequency: Min 2X/week Follow Up Recommendations: Skilled nursing facility Equipment Recommended: Defer to next venue OT Goals ADL Goals Pt Will Transfer to Toilet: with min assist;Ambulation;Raised toilet seat with arms ADL Goal: Toilet Transfer - Progress: Progressing toward goals Pt Will Perform Toileting - Clothing Manipulation: with min assist;Standing ADL Goal: Toileting - Clothing Manipulation - Progress: Progressing toward goals  OT Treatment Precautions/Restrictions  Precautions Precautions: Fall Restrictions Weight Bearing Restrictions: No   ADL ADL Upper Body Dressing: Performed;Minimal assistance Upper Body Dressing Details (indicate cue type and reason): Min assist for steadying while pt donned gown on back. Where Assessed - Upper Body Dressing: Standing Lower Body Dressing: Performed;+1 Total assistance Lower Body Dressing Details (indicate cue type and reason): +1 assist to don bil. socks. Where Assessed - Lower Body Dressing: Sitting, bed Toilet Transfer: Performed;Moderate assistance Toilet Transfer Details (indicate cue type and reason): Mod assist to maneuver RW and to safely position body in front of commode before descent.  Toilet Transfer Method: Surveyor, minerals: Set designer - Clothing Manipulation: Performed;Minimal assistance Toileting - Clothing  Manipulation Details (indicate cue type and reason): Min assist to pull gown up over hips. Where Assessed - Toileting Clothing Manipulation: Standing Toileting - Hygiene: Performed;Minimal assistance Toileting - Hygiene Details (indicate cue type and reason): Min assist for steadying and balance while pt leans anteriorly to perform hygiene task. Where Assessed - Toileting Hygiene: Standing Equipment Used: Rolling walker Ambulation Related to ADLs: Pt with increased time to perform functional ambulation. Mobility  Bed Mobility Bed Mobility: Yes Rolling Right: 4: Min assist Rolling Right Details (indicate cue type and reason): (A) to initiate roll with cues for hand placement.  Pt needs extra time Right Sidelying to Sit: 4: Min assist;HOB flat;With rails Right Sidelying to Sit Details (indicate cue type and reason): (A) to elevate shoulders OOB with cues for hand placement Sitting - Scoot to Edge of Bed: With rail;3: Mod assist Sitting - Scoot to Edge of Bed Details (indicate cue type and reason): (A) to advance hips to EOB with pad and make cues for proper weight shifts Transfers Sit to Stand: 3: Mod assist Sit to Stand Details (indicate cue type and reason):   (A) to initiate transfer with max cues for hand placement. Pt tends to keep hands on RW with transfers  Stand to Sit: 3: Mod assist;To chair/3-in-1 Stand to Sit Details:   (A) to slowly descend to 3n1/recliner with max cues for hand placement  Exercises    End of Session OT - End of Session Equipment Utilized During Treatment: Gait belt Activity Tolerance: Patient limited by fatigue;Patient tolerated treatment well Patient left: in chair;with call bell in reach General Behavior During Session: Other (comment) (anxious) Cognition: Impaired Cognitive Impairment: Pt exhibits decreased awareness of deficts and need for assistance with transfers and ADLs.  Continues to demonstrate with anxious behavior throughout session.  Tina [name], Tina [name]  06/25/2011, 2:16 PM 06/25/2011 Tina [name] OTR/L Pager [phone] Office [phone]

## 2011-06-25 NOTE — Progress Notes (Signed)
Seen and examined.  Discussed in rounds with full team, specifically with Dr. Konrad Dolores.  Agree with his management.  Briefly. 71 yo female with: 1. Acute CVA causing vertigo.  Now improving.  WU complete with MRA.  Since she had CVA and not on ASA, I believe ASA would be fine rather than plavix. 2. Dispo plan is to home with HHPT.  She has improved to the point where she is safe at home. 3. Hypertension.  Patient has diffuse arteriosclerosis and wide pulse pressure.  She also complains of lightheadedness on standing.  I believe it is important to avoid overtreatment with antihypertensives.  I would aim for systolic BP at 160 or less as goal. 4. CKD stage 3-4.  At baseline creat.  Restart ACE.  Watch hydration, renal function and BP as above.

## 2011-06-26 LAB — GLUCOSE, CAPILLARY
Glucose-Capillary: 172 mg/dL — ABNORMAL HIGH (ref 70–99)
Glucose-Capillary: 266 mg/dL — ABNORMAL HIGH (ref 70–99)
Glucose-Capillary: 342 mg/dL — ABNORMAL HIGH (ref 70–99)

## 2011-06-26 MED ORDER — MECLIZINE HCL 12.5 MG PO TABS: 12.5000 mg | ORAL_TABLET | Freq: Three times a day (TID) | ORAL | Status: DC | PRN

## 2011-06-26 MED ORDER — "INSULIN SYRINGE-NEEDLE U-100 31G X 15/64"" 1 ML MISC": 20.0000 [IU] | Status: DC

## 2011-06-26 MED ORDER — INSULIN PEN NEEDLE 29G X 12.7MM MISC: 1.0000 | Freq: Every day | Status: DC

## 2011-06-26 MED ORDER — ASPIRIN EC 81 MG PO TBEC: 81.0000 mg | DELAYED_RELEASE_TABLET | Freq: Every day | ORAL | Status: DC

## 2011-06-26 MED ORDER — STROKE: EARLY STAGES OF RECOVERY BOOK
Freq: Once | Status: AC
  Administered 2011-06-26: 06:00:00
  Filled 2011-06-26: qty 1

## 2011-06-26 MED ORDER — DULOXETINE HCL 20 MG PO CPEP: 20.0000 mg | ORAL_CAPSULE | Freq: Every day | ORAL | Status: DC

## 2011-06-26 MED ORDER — INSULIN GLARGINE 100 UNIT/ML ~~LOC~~ SOLN: 20.0000 [IU] | Freq: Every day | SUBCUTANEOUS | Status: DC

## 2011-06-26 NOTE — Clinical Documentation Improvement (Signed)
BMI DOCUMENTATION CLARIFICATION QUERY  THIS DOCUMENT IS NOT A PERMANENT PART OF THE MEDICAL RECORD  TO RESPOND TO THE THIS QUERY, FOLLOW THE INSTRUCTIONS BELOW:  1. If needed, update documentation for the patient's encounter via the notes activity.  2. Access this query again and click edit on the In [employer].  3. After updating, or not, click F2 to complete all highlighted (required) fields concerning your review. Select "additional documentation in the medical record" OR "no additional documentation provided".  4. Click Sign note button.  5. The deficiency will fall out of your In Basket *Please let us know if you are not able to complete this workflow by phone or e-mail (listed below).         06/26/11  Dear Dr. Deirdre Priest  Marton Redwood  In an effort to better capture your patient's severity of illness, reflect appropriate length of stay and utilization of resources, a review of the patient medical record has revealed the following indicators.    In responding to this query please exercise your independent judgment.  The fact that a query is asked, does not imply that any particular answer is desired or expected. THANK YOU FOR ADDRESSING THIS QUERY.  Possible Clinical conditions - Morbid Obesity W/ BMI=  - Other condition - Cannot Clinically determine  Supporting Information: - Weight: 250lbs - Height 5'5" - BMI = 42.1  Reviewed:  no additional documentation provided  Thank You,  Beverley Fiedler RN Clinical Documentation Specialist: Cell:  [phone]  Health Information Management [place]

## 2011-06-26 NOTE — Progress Notes (Signed)
Called MD to ask for insulin pen needles for discharge and Lantus Solostar pen.  According to RN, patient was taught how to give insulin with pen.

## 2011-06-26 NOTE — Progress Notes (Signed)
Physical Therapy Treatment Patient Details Name: Tina [name] MRN: [id number] DOB: [birth year] Today's Date: 06/26/2011  PT Assessment/Plan  PT - Assessment/Plan Comments on Treatment Session: Pt able to increase ambulation and complete stair negotiation.  Discussed with pt about life alert and intermittent supervision at home.  Pt has made great progress therefore will recommend HHPT with HHaid, wide RW and wide 3n1 with intermittent supervision. PT Plan: Discharge plan needs to be updated;Frequency remains appropriate PT Frequency: Min 4X/week Follow Up Recommendations: Home health PT;Supervision - Intermittent Equipment Recommended: 3 in 1 bedside comode;Rolling walker with 5" wheels (wide equipment) PT Goals  Acute Rehab PT Goals PT Goal Formulation: With patient Time For Goal Achievement: 2 weeks Pt will go Supine/Side to Sit: with supervision PT Goal: Supine/Side to Sit - Progress: Met Pt will go Sit to Supine/Side: with supervision PT Goal: Sit to Supine/Side - Progress: Progressing toward goal Pt will go Sit to Stand: with supervision PT Goal: Sit to Stand - Progress: Progressing toward goal Pt will go Stand to Sit: with supervision PT Goal: Stand to Sit - Progress: Progressing toward goal Pt will Transfer Bed to Chair/Chair to Bed: with supervision PT Transfer Goal: Bed to Chair/Chair to Bed - Progress: Progressing toward goal Pt will Stand: with supervision;1 - 2 min;with bilateral upper extremity support PT Goal: Stand - Progress: Progressing toward goal Pt will Ambulate: 51 - 150 feet;with rolling walker;with supervision PT Goal: Ambulate - Progress: Progressing toward goal  PT Treatment Precautions/Restrictions  Precautions Precautions: Fall Restrictions Weight Bearing Restrictions: No Mobility (including Balance) Bed Mobility Bed Mobility: Yes Supine to Sit: 5: Supervision Supine to Sit Details (indicate cue type and reason): Supervision for safety with cues  for technique.  Pt intially attempted to roll and unable to complete without rail.  Pt able to long sit on elbows and able to get EOB with minguard for safety with extra time to complete Transfers Transfers: Yes Sit to Stand: 3: Mod assist;From elevated surface;4: Min assist;From bed;From chair/3-in-1 Sit to Stand Details (indicate cue type and reason): Pt needed mod (A) to stand from low surface however able to complete from elevated surface with minguard (A).   Stand to Sit: 4: Min assist (minguard for safety) Stand to Sit Details: Minguard for safety with cues for technique with max cues for hand placement. Ambulation/Gait Ambulation/Gait: Yes Ambulation/Gait Assistance: 4: Min assist (minguard for safety) Ambulation/Gait Assistance Details (indicate cue type and reason): Minguard for safety.  Pt able to ambulate 175' with sitting resting break ~100' to complete stairs.  Ambulation Distance (Feet): 175 Feet Assistive device: Rolling walker Gait Pattern: Within Functional Limits Stairs: Yes Stairs Assistance: 4: Min assist (minguard for safety) Stairs Assistance Details (indicate cue type and reason): Minguard for safety with  Stair Management Technique: Two rails;Forwards Number of Stairs: 4  Wheelchair Mobility Wheelchair Mobility: No  Posture/Postural Control Posture/Postural Control: No significant limitations Balance Balance Assessed: Yes Static Sitting Balance Static Sitting - Balance Support: Feet supported Static Sitting - Level of Assistance: 5: Stand by assistance Exercise    End of Session PT - End of Session Equipment Utilized During Treatment: Gait belt Activity Tolerance: Patient tolerated treatment well Patient left: in chair;with call bell in reach Nurse Communication: Mobility status for transfers;Mobility status for ambulation General Behavior During Session: [facility] for tasks performed Cognition: [facility] for tasks performed  Rogerick Baldwin 06/26/2011, 2:18  PM [phone]

## 2011-06-26 NOTE — Discharge Instructions (Signed)
You were admitted to the hospital due to feelings of extreme dizziness that were likely due to a very small stroke and significant changes in your blood pressure.   Please follow up with Dr. Deirdre Priest on the 25th at 3:15 Please follow up with Dr. Raymondo Band (our pharmacist) for diabetes education on the 19th at the [facility] family medicine clinic  Please continue to take your Lantus as prescribed. You may increase your daily dose by 1 unit every day if your morning sugar level is 120 or above.   Please continue taking a baby aspirin every day and all other medications as prescribed.

## 2011-06-26 NOTE — Progress Notes (Signed)
Family Medicine Teaching Service [facility] Progress Note  Patient name: Tina [name] Medical record number: [id number] Date of birth: 1940-12-25 Age: 71 y.o. Gender: female    LOS: 3 days   Primary Care Provider: Carney Living, MD, MD  Overnight Events:  NAEO. Feels well this morning. Slept well after receiving trazadone 50mg . Smiling and happy this morning. Eating well. Continues to ambulate w/ assistance and spends time in bed and chair during the day. No other spells of dizziness since admission. Some lightheadedness when going from lying to sitting standing.   Denies CP, HA, SOB, nausea, vomiting, diarrhea, constipation   Objective: Vital signs in last 24 hours: Temp:  [97.5 F (36.4 C)-98.8 F (37.1 C)] 97.9 F (36.6 C) (02/14 0600) Pulse Rate:  [60-72] 64  (02/14 0600) Resp:  [18-20] 20  (02/14 0600) BP: (106-131)/(62-77) 106/65 mmHg (02/14 0600) SpO2:  [91 %-96 %] 91 % (02/14 0600) Weight:  [250 lb 14.1 oz (113.8 kg)-251 lb 12.3 oz (114.2 kg)] 250 lb 14.1 oz (113.8 kg) (02/14 0600)  Wt Readings from Last 3 Encounters:  06/26/11 250 lb 14.1 oz (113.8 kg)  06/17/11 252 lb 4.8 oz (114.443 kg)  06/02/11 251 lb (113.853 kg)     Current Facility-Administered Medications  Medication Dose Route Frequency Provider Last Rate Last Dose  .  stroke: mapping our early stages of recovery book   Does not apply Once Denny Levy, MD      . 0.9 %  sodium chloride infusion  250 mL Intravenous PRN Barnabas Lister, MD      . acetaminophen (TYLENOL) tablet 650 mg  650 mg Oral Q8H Denny Levy, MD   650 mg at 06/26/11 0540  . cloNIDine (CATAPRES) tablet 0.1 mg  0.1 mg Oral BID Barnabas Lister, MD   0.1 mg at 06/25/11 2230  . clopidogrel (PLAVIX) tablet 75 mg  75 mg Oral Q breakfast Nelda Bucks, MD   75 mg at 06/26/11 0803  . DULoxetine (CYMBALTA) DR capsule 20 mg  20 mg Oral Daily Shelly Flatten, MD   20 mg at 06/25/11 1050  . enalapril (VASOTEC) tablet 10 mg  10 mg Oral BID Shelly Flatten, MD   10 mg at 06/25/11 2230  . gabapentin (NEURONTIN) capsule 100 mg  100 mg Oral TID Barnabas Lister, MD   100 mg at 06/25/11 2230  . hydrALAZINE (APRESOLINE) injection 10 mg  10 mg Intravenous Q4H PRN Ivy Tye Savoy, MD      . HYDROcodone-acetaminophen [facility]) 5-325 MG per tablet 1 tablet  1 tablet Oral Q4H PRN Barnabas Lister, MD   1 tablet at 06/25/11 1831  . insulin aspart (novoLOG) injection 0-15 Units  0-15 Units Subcutaneous TID WC Ivy Tye Savoy, MD   3 Units at 06/26/11 0802  . insulin aspart (novoLOG) injection 0-5 Units  0-5 Units Subcutaneous QHS Barnabas Lister, MD   4 Units at 06/24/11 2139  . insulin glargine (LANTUS) injection 20 Units  20 Units Subcutaneous Daily Shelly Flatten, MD   20 Units at 06/25/11 1050  . meclizine (ANTIVERT) tablet 12.5 mg  12.5 mg Oral TID PRN Nelda Bucks, MD   12.5 mg at 06/25/11 1508  . ondansetron (ZOFRAN) injection 4 mg  4 mg Intravenous Q8H PRN Ivy Tye Savoy, MD      . ondansetron [facility]) tablet 4 mg  4 mg Oral Q8H PRN Ivy Tye Savoy, MD      .  pantoprazole (PROTONIX) EC tablet 40 mg  40 mg Oral Daily Ivy Tye Savoy, MD   40 mg at 06/25/11 1100  . senna-docusate (Senokot-S) tablet 1 tablet  1 tablet Oral QHS PRN Barnabas Lister, MD      . simvastatin (ZOCOR) tablet 40 mg  40 mg Oral QHS Barnabas Lister, MD   40 mg at 06/25/11 2230  . sodium chloride 0.9 % injection 3 mL  3 mL Intravenous Q12H Ivy Tye Savoy, MD   3 mL at 06/25/11 2231  . sodium chloride 0.9 % injection 3 mL  3 mL Intravenous PRN Ivy Tye Savoy, MD      . traZODone (DESYREL) tablet 50 mg  50 mg Oral Once Shelly Flatten, MD   50 mg at 06/25/11 2230  . DISCONTD: atenolol (TENORMIN) tablet 25 mg  25 mg Oral BID Barnabas Lister, MD   25 mg at 06/25/11 1040  . DISCONTD: escitalopram (LEXAPRO) tablet 10 mg  10 mg Oral Daily Nelda Bucks, MD   10 mg at 06/24/11 1259  . DISCONTD: heparin injection 5,000 Units  5,000 Units Subcutaneous Q8H Ivy Tye Savoy, MD   5,000 Units at 06/25/11  [phone]  . DISCONTD: insulin glargine (LANTUS) injection 10 Units  10 Units Subcutaneous Daily Shelly Flatten, MD   10 Units at 06/24/11 0925     PE: Gen:NAD  HEENT: MMM  CV: RRR  RUE:AVWU  Ext/Musc: 3+ LE edema  Neuro: CN grossly intact. Purposeful extremity movement. Strength 2+ bilaterally in UE and 1+ in LE. No dizziness on rotation of head. No nystagmus   Labs/Studies:   CBG (last 3)   Basename 06/26/11 0704 06/25/11 2217 06/25/11 1641  GLUCAP 172* 148* 356*   MRA: No posterior circulation pathology definable.  No anterior circulation occlusion or correctable proximal stenosis.  Some atherosclerotic change of the internal carotid arteries as they pass through the skull base and within the carotid siphon regions as outlined above.   Assessment/Plan:  71 yo obese F with history of Diabetes, CKD Stage 3 and HTN who presents with dizziness and vertigo-like symptoms that began 2 days ago.   1. Dizziness/ Vertigo - W/o episode of vertigo or dizziness since admission. Possible CVA of paracentral pontine area (MRI confirmed) vs orthostatics vs BPV. Only when going from lying to sitting/standing. PCP made team aware that pt has some baseline vertigo. Improved significantly since admission. Neuro signed off. Stroke team following. On Tele w/o event since admission. Carotid dopplers and Echo reasurring. MRA as above. Pt set up for home health. - Continue Tele  - Stroke protocol  - Continue PT/OT  - Case mgt for [facility]  - Nursing staff to ambulate pt BID   2. Bilateral Pitting Edema: Likely multifactorial. Baseline venous insuficiency w/ renal insufficiency and HTN. CHF unlikely due to Echo. Slight bump in Cr yesterday likely from Lasix.  - No diuretics  - continue to hold diuresis due to AonCRF  - Continue to monitor BMET for Creatinine (baseline appears to be 1.7-1.9), currently 2.05  - Daily weights and strict I/O   3. HTN: well controlled. Changed from Atenolol to Enalapril  yesterday.   - Will continue Clonidine  - Hold  HCTZ due to renal function.  - Hydralizine as needed for systolics > 180, diastolic > 100   4. Diabetes: Hgb A1c 12.2 . BG continues to be elevated but better controlled.  - Monitor CBG  - Continue Diabetes  education. As pt amenable to using injectable medications  - Lantus 20   5. GERD: No current complaints  - Continue Protonix   6. Depression: Pts mood significantly improved today.  - Continue with Cymbalta 20mg  for depression/anxiety/pain as pt w/ chronic pain complaints.   7. HLD  - Continue home statin   8. FEN/GI: - SLIV - Diabetic diet as tolerated.   9. PPx: Plavix   10. Dispo: Pending clinical improvement.       Signed: Shelly Flatten, MD Family Medicine Resident PGY-1 06/26/2011 8:32 AM

## 2011-06-26 NOTE — Progress Notes (Signed)
Family Medicine Teaching Service Attending Note  I interviewed and examined patient Tina [name] and reviewed their tests and x-rays.  I discussed with Dr. Konrad Dolores and reviewed their note for today.  I agree with their assessment and plan.     Additionally  Agree is ready to go home support.  She feels she is doing well with her walker and is comfortable with insulin injectinos  I wlll follow in my office

## 2011-06-26 NOTE — Discharge Summary (Signed)
Agree with DC and documentation as outlined by Dr. Konrad Dolores

## 2011-06-26 NOTE — Progress Notes (Signed)
Discussed in rounds and agree with Dr. Satira Sark management

## 2011-06-27 ENCOUNTER — Telehealth: Payer: Self-pay | Admitting: Family Medicine

## 2011-06-27 ENCOUNTER — Telehealth: Payer: Self-pay | Admitting: *Deleted

## 2011-06-27 NOTE — Telephone Encounter (Signed)
Received call from [facility].  Patient was checking to see if she needs Rx for Plavix.  Per Dr. Joellen Jersey was not d/c'd from hospital with Plavix.  Gaylene Brooks, RN

## 2011-06-27 NOTE — Telephone Encounter (Signed)
Nurse from [facility] is calling needing to know the dosage of her Novolog and Lantus.  Her Hospital Discharge instructions did not give the dosage.

## 2011-06-27 NOTE — Telephone Encounter (Signed)
Will forward to MD for clarification  Lantus- 20units @ bedtime Novalog- seems to be discotinued

## 2011-07-01 ENCOUNTER — Ambulatory Visit (INDEPENDENT_AMBULATORY_CARE_PROVIDER_SITE_OTHER): Payer: Medicare HMO | Admitting: Pharmacist

## 2011-07-01 ENCOUNTER — Encounter: Payer: Self-pay | Admitting: Pharmacist

## 2011-07-01 ENCOUNTER — Telehealth: Payer: Self-pay | Admitting: Family Medicine

## 2011-07-01 VITALS — Ht 65.0 in | Wt 249.5 lb

## 2011-07-01 DIAGNOSIS — E118 Type 2 diabetes mellitus with unspecified complications: Secondary | ICD-10-CM

## 2011-07-01 NOTE — Patient Instructions (Signed)
It was good to see you again!  1) Start to inject Lantus  22 units at bedtime  (GRAY PEN) 2) Start to inject Novolog 6 units before each meal  (BLUE & ORANGE PEN)  3) Test your blood sugar before you eat each meal (Breakfast, Lunch, Dinner). If it is less than 100, do not give yourself the insulin. Write down your numbers on the calendar.  Return to clinic for follow-up appointment with Dr. Deirdre Priest next Monday (Feb 25) at 315pm. Please bring your insulin pens and blood sugar calendar with you.

## 2011-07-01 NOTE — Progress Notes (Signed)
  Subjective:    Patient ID: Tina [name], female    DOB: Apr 15, 1941, 71 y.o.   MRN: [id number]  HPI Ms. Woehler arrived very anxious and agitated, having had a disagreement with her son Clide Cliff).  She was seen in clinic for a post-hospital follow-up regarding her diabetes management.  Upon discharge, it appears that she was sent home with both Novolog and Lantus insulin pens, though the physician discharge summary & orders only indicate plans for Lantus.  She brought with her a Novolog sliding scale that was recommended by her [facility] home health nurse, but does not appear to have ever been ordered.  Her fasting blood sugars have been in the 200-300s, and she seems anxious and confused about her regimen, and her son expressed concerns about her ability to manage her own meds, drive, and take care of herself.      Review of Systems     Objective:   Physical Exam        Assessment & Plan:   Diabetes of many years duration currently under poor control of blood glucose based on   Lab Results  Component Value Date   HGBA1C 12.6* 06/24/2011  and home fasting CBG readings 200-300s. Control is suboptimal due to confusion with insulin regimen. Denies hypoglycemic events. Increased dose of basal insulin Lantus (insulin glargine) to 22 units at bedtime. Adjusted dose of rapid insulin Novolog (insulin aspart) to 6 units before each meal.  Instructed patient to check blood sugar before each meal & not to give herself insulin if her blood sugar is <100.  Written patient instructions provided along with calendar to document blood sugar readings.  Follow up with Dr. Deirdre Priest on Monday, Feb 25 at 3:15.   Total time in face to face counseling 40 minutes.  Patient seen with Albertine Grates and Benjaman Pott, Pharmacy Residents. Marland Kitchen

## 2011-07-01 NOTE — Telephone Encounter (Signed)
Pt need to talk with you regarding her health.  Family members questioning her ability to drive and take care of herself.

## 2011-07-01 NOTE — Progress Notes (Signed)
  Subjective:    Patient ID: Tina [name], female    DOB: 10-Jun-1940, 71 y.o.   MRN: [id number]  HPI Reviewed and agree with Dr. Macky Lower management.    Review of Systems     Objective:   Physical Exam        Assessment & Plan:

## 2011-07-01 NOTE — Assessment & Plan Note (Signed)
  Diabetes of many years duration currently under poor control of blood glucose based on   Lab Results  Component Value Date   HGBA1C 12.6* 06/24/2011  and home fasting CBG readings 200-300s. Control is suboptimal due to confusion with insulin regimen. Denies hypoglycemic events. Increased dose of basal insulin Lantus (insulin glargine) to 22 units at bedtime. Adjusted dose of rapid insulin Novolog (insulin aspart) to 6 units before each meal.  Instructed patient to check blood sugar before each meal & not to give herself insulin if her blood sugar is <100.  Written patient instructions provided along with calendar to document blood sugar readings.  Follow up with Dr. Deirdre Priest on Monday, Feb 25 at 3:15.   Total time in face to face counseling 40 minutes.  Patient seen with Albertine Grates and Benjaman Pott, Pharmacy Residents.

## 2011-07-02 ENCOUNTER — Telehealth: Payer: Self-pay | Admitting: Family Medicine
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
## 2011-07-03 ENCOUNTER — Ambulatory Visit: Payer: Medicare HMO | Admitting: Family Medicine

## 2011-07-06 ENCOUNTER — Other Ambulatory Visit: Payer: Self-pay

## 2011-07-06 ENCOUNTER — Inpatient Hospital Stay (HOSPITAL_COMMUNITY)
Admission: EM | Admit: 2011-07-06 | Discharge: 2011-07-09 | DRG: 310 | Disposition: A | Discharge status: Skilled Nursing Facility | Payer: Medicare HMO | Source: Ambulatory Visit | Attending: Family Medicine | Admitting: Family Medicine

## 2011-07-06 ENCOUNTER — Emergency Department (HOSPITAL_COMMUNITY): Payer: Medicare HMO

## 2011-07-06 ENCOUNTER — Encounter (HOSPITAL_COMMUNITY): Payer: Self-pay

## 2011-07-06 DIAGNOSIS — N183 Chronic kidney disease, stage 3 unspecified: Secondary | ICD-10-CM | POA: Diagnosis present

## 2011-07-06 DIAGNOSIS — E118 Type 2 diabetes mellitus with unspecified complications: Secondary | ICD-10-CM | POA: Diagnosis present

## 2011-07-06 DIAGNOSIS — M1990 Unspecified osteoarthritis, unspecified site: Secondary | ICD-10-CM | POA: Diagnosis present

## 2011-07-06 DIAGNOSIS — R531 Weakness: Secondary | ICD-10-CM

## 2011-07-06 DIAGNOSIS — I10 Essential (primary) hypertension: Secondary | ICD-10-CM | POA: Diagnosis present

## 2011-07-06 DIAGNOSIS — E1149 Type 2 diabetes mellitus with other diabetic neurological complication: Secondary | ICD-10-CM | POA: Diagnosis present

## 2011-07-06 DIAGNOSIS — R609 Edema, unspecified: Secondary | ICD-10-CM

## 2011-07-06 DIAGNOSIS — E785 Hyperlipidemia, unspecified: Secondary | ICD-10-CM | POA: Diagnosis present

## 2011-07-06 DIAGNOSIS — Z833 Family history of diabetes mellitus: Secondary | ICD-10-CM

## 2011-07-06 DIAGNOSIS — F341 Dysthymic disorder: Secondary | ICD-10-CM | POA: Diagnosis present

## 2011-07-06 DIAGNOSIS — I4891 Unspecified atrial fibrillation: Principal | ICD-10-CM | POA: Diagnosis present

## 2011-07-06 DIAGNOSIS — Z8673 Personal history of transient ischemic attack (TIA), and cerebral infarction without residual deficits: Secondary | ICD-10-CM

## 2011-07-06 DIAGNOSIS — E1142 Type 2 diabetes mellitus with diabetic polyneuropathy: Secondary | ICD-10-CM | POA: Diagnosis present

## 2011-07-06 LAB — URINALYSIS, ROUTINE W REFLEX MICROSCOPIC
Glucose, UA: NEGATIVE mg/dL
Leukocytes, UA: NEGATIVE
Nitrite: NEGATIVE
Specific Gravity, Urine: 1.014 (ref 1.005–1.030)
pH: 6.5 (ref 5.0–8.0)

## 2011-07-06 LAB — DIFFERENTIAL
Basophils Absolute: 0 10*3/uL (ref 0.0–0.1)
Eosinophils Absolute: 0.2 10*3/uL (ref 0.0–0.7)
Eosinophils Relative: 2 % (ref 0–5)
Lymphocytes Relative: 21 % (ref 12–46)
Lymphs Abs: 1.7 10*3/uL (ref 0.7–4.0)
Monocytes Absolute: 0.6 10*3/uL (ref 0.1–1.0)

## 2011-07-06 LAB — TROPONIN I: Troponin I: <0.3 ng/mL (ref <0.30)

## 2011-07-06 LAB — APTT: aPTT: 26 seconds (ref 24–37)

## 2011-07-06 LAB — CBC
HCT: 36 % (ref 36.0–46.0)
MCH: 28.2 pg (ref 26.0–34.0)
MCV: 86.1 fL (ref 78.0–100.0)
Platelets: 175 10*3/uL (ref 150–400)
RDW: 14 % (ref 11.5–15.5)

## 2011-07-06 LAB — POCT I-STAT TROPONIN I: Troponin i, poc: 0.01 ng/mL (ref 0.00–0.08)

## 2011-07-06 LAB — URINE MICROSCOPIC-ADD ON

## 2011-07-06 LAB — BASIC METABOLIC PANEL
CO2: 22 mEq/L (ref 19–32)
Chloride: 108 mEq/L (ref 96–112)
Creatinine, Ser: 1.9 mg/dL — ABNORMAL HIGH (ref 0.50–1.10)
GFR calc Af Amer: 30 mL/min — ABNORMAL LOW (ref >90)
Potassium: 4.4 mEq/L (ref 3.5–5.1)
Sodium: 142 mEq/L (ref 135–145)

## 2011-07-06 MED ORDER — ACETAMINOPHEN 325 MG PO TABS
650.0000 mg | ORAL_TABLET | Freq: Once | ORAL | Status: AC
  Administered 2011-07-06: 650 mg via ORAL
  Filled 2011-07-06: qty 2

## 2011-07-06 MED ORDER — HEPARIN BOLUS VIA INFUSION: 2500.0000 [IU] | Freq: Once | INTRAVENOUS | Status: DC

## 2011-07-06 MED ORDER — DILTIAZEM HCL 100 MG IV SOLR
5.0000 mg/h | Freq: Once | INTRAVENOUS | Status: AC
  Administered 2011-07-06: 5 mg/h via INTRAVENOUS
  Filled 2011-07-06: qty 100

## 2011-07-06 MED ORDER — DILTIAZEM HCL 25 MG/5ML IV SOLN
20.0000 mg | Freq: Once | INTRAVENOUS | Status: AC
  Administered 2011-07-06: 20 mg via INTRAVENOUS
  Filled 2011-07-06: qty 4

## 2011-07-06 MED ORDER — SODIUM CHLORIDE 0.9 % IV SOLN
Freq: Once | INTRAVENOUS | Status: AC
  Administered 2011-07-06: 10 mL/h via INTRAVENOUS
  Administered 2011-07-06: 20:00:00 via INTRAVENOUS

## 2011-07-06 MED ORDER — ONDANSETRON HCL 4 MG/2ML IJ SOLN
INTRAMUSCULAR | Status: AC
  Administered 2011-07-06: 4 mg via INTRAVENOUS
  Filled 2011-07-06: qty 2

## 2011-07-06 MED ORDER — DILTIAZEM HCL 25 MG/5ML IV SOLN
20.0000 mg | Freq: Once | INTRAVENOUS | Status: AC
  Administered 2011-07-06: 20 mg via INTRAVENOUS

## 2011-07-06 MED ORDER — METOPROLOL TARTRATE 1 MG/ML IV SOLN
5.0000 mg | Freq: Once | INTRAVENOUS | Status: AC
  Administered 2011-07-06: 5 mg via INTRAVENOUS
  Filled 2011-07-06: qty 5

## 2011-07-06 MED ORDER — DILTIAZEM HCL 100 MG IV SOLR
5.0000 mg/h | INTRAVENOUS | Status: DC
  Administered 2011-07-06 – 2011-07-07 (×2): 15 mg/h via INTRAVENOUS
  Filled 2011-07-06 (×2): qty 100

## 2011-07-06 MED ORDER — HEPARIN SOD (PORCINE) IN D5W 100 UNIT/ML IV SOLN
1400.0000 [IU]/h | INTRAVENOUS | Status: DC
  Administered 2011-07-06: 1400 [IU]/h via INTRAVENOUS
  Filled 2011-07-06 (×2): qty 250

## 2011-07-06 MED ORDER — ONDANSETRON HCL 4 MG/2ML IJ SOLN
4.0000 mg | Freq: Once | INTRAMUSCULAR | Status: AC
  Administered 2011-07-06: 4 mg via INTRAVENOUS
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
## 2011-07-06 NOTE — ED Notes (Signed)
ZOX:[id number]<AV> Expected date:07/06/11<BR> Expected time: 9:58 AM<BR> Means of arrival:Ambulance<BR> Comments:<BR> EMS-weakness s/p cva 2 weeks ago

## 2011-07-06 NOTE — ED Notes (Signed)
Pt asking for her son, this RN calls pt's son on the phone who states he is coming to see pt as soon as he can, pt notified.

## 2011-07-06 NOTE — ED Provider Notes (Signed)
At time of PA signout patient was still in atrial fibrillation with RVR.  Patient was rebolused with diltiazem 20 mg IV while in my care with resolution of RVR.  Heparin was also ordered as this was a new diagnosis for the patient.  Per consultation with pharmacy and recent ischemic CVA only heparin infusion was begun and bolus was held.  Heparin was only ordered after I contacted family medicine to notify them of the patient's condition and plan to upgrade to stepdown bed given continued issues with rate control.  Patient improved and remained stable without RVR following second bolus until transfer to [facility] for admission.  Cyndra Numbers, MD 07/07/11 1251

## 2011-07-06 NOTE — ED Provider Notes (Signed)
History     CSN: [id number]  Arrival date & time 07/06/11  1012   First MD Initiated Contact with Patient 07/06/11 1021      Chief Complaint  Patient presents with  . Headache  . Weakness    (Consider location/radiation/quality/duration/timing/severity/associated sxs/prior treatment) HPI History provided by pt.  Pt a poor historian.  Pt presents w/ c/o generalized weakness since waking this morning.  Was afraid to get out of bed but managed to walk to the bathroom.  Associated w/ SOB and palpitations as well as dysuria and darkly colored urine.  Has also had intermittent right frontal headache and dizziness.  Denies fever, cough, CP, abd pain, N/V/D.  Denies recent fall or head trauma. Has had several recent medications changes.  Has not taken any of her vicodin this am.  Per prior chart, pt admitted 2/11-2/14 for dizziness concerning for stroke.  MRI showed questionable tiny acute left paracentral pontine infarct. Pt was set up to receive home health d/t chronically limited mobility.   Past Medical History  Diagnosis Date  . Normal exercise sestamibi stress test 2007    [facility]  . Diabetes mellitus   . Hyperlipidemia   . Hypertension   . Heart murmur   . Chronic kidney disease     CKD stage 3  . Depression   . Stroke 06/2011    TIA     Past Surgical History  Procedure Date  . Bladder surgery 1994  . Knee surgery as child  . Abdominal hysterectomy     Family History  Problem Relation Age of Onset  . Coronary artery disease Mother   . Diabetes Mother   . Heart disease Mother   . Diabetes Sister   . Hypertension Sister   . Cancer Father     History  Substance Use Topics  . Smoking status: Never Smoker   . Smokeless tobacco: Never Used  . Alcohol Use: No    OB History    Grav Para Term Preterm Abortions TAB SAB Ect Mult Living                  Review of Systems  All other systems reviewed and are negative.    Allergies  Review of patient's allergies  indicates no known allergies.  Home Medications   Current Outpatient Rx  Name Route Sig Dispense Refill  . ACETAMINOPHEN ER 650 MG PO TBCR Oral Take 650 mg by mouth every 8 (eight) hours as needed.      . ASPIRIN EC 81 MG PO TBEC Oral Take 1 tablet (81 mg total) by mouth daily. 30 tablet 11  . DICLOFENAC SODIUM 1 % TD GEL Topical Apply 1 application topically 4 (four) times daily as needed. As needed for pain    . DULOXETINE HCL 20 MG PO CPEP Oral Take 1 capsule (20 mg total) by mouth daily. 30 capsule 6  . ENALAPRIL MALEATE 20 MG PO TABS Oral Take 20 mg by mouth 2 (two) times daily.    Marland Kitchen ESOMEPRAZOLE MAGNESIUM 40 MG PO CPDR Oral Take 40 mg by mouth daily before breakfast.    . GABAPENTIN 100 MG PO CAPS Oral Take 100 mg by mouth 3 (three) times daily.    . ASCENSIA CONTOUR TEST VI       . HYDROCODONE-ACETAMINOPHEN 5-500 MG PO TABS Oral Take 1 tablet by mouth every 6 (six) hours as needed. For pain    . HYDROCODONE-ACETAMINOPHEN 5-500 MG PO TABS Oral Take 1 tablet  by mouth every 6 (six) hours as needed. For pain    . INSULIN GLARGINE 100 UNIT/ML [place] SOLN Subcutaneous Inject 20 Units into the skin at bedtime. Please give Lantus Solostar Pens. 3 mL 12  . INSULIN PEN NEEDLE 29G X 12.7MM MISC Does not apply 1 Container by Does not apply route daily. 100 each 9  . INSULIN SYRINGE-NEEDLE U-100 31G X 15/64" 1 ML MISC Does not apply 20 Units by Does not apply route every morning. 30 each 11  . MECLIZINE HCL 12.5 MG PO TABS Oral Take 1 tablet (12.5 mg total) by mouth 3 (three) times daily as needed for dizziness or nausea. 30 tablet 3  . ONDANSETRON HCL 4 MG PO TABS Oral Take 4 mg by mouth every 6 (six) hours as needed. For nausea    . SIMVASTATIN 40 MG PO TABS Oral Take 40 mg by mouth at bedtime.      BP 155/75  Pulse 136  Temp(Src) 99 F (37.2 C) (Oral)  Resp 20  Wt 249 lb (112.946 kg)  SpO2 100%  Physical Exam  Nursing note and vitals reviewed. Constitutional: She is oriented to person,  place, and time. She appears well-developed and well-nourished.  HENT:  Head: Normocephalic and atraumatic.  Eyes:       Normal appearance  Neck: Normal range of motion.  Cardiovascular:       Rapid and Irregular rhythm  Pulmonary/Chest: Effort normal and breath sounds normal.  Abdominal: Soft. Bowel sounds are normal. She exhibits no distension.       Obese, diffuse mild ttp.   Musculoskeletal: Normal range of motion.       2+ bilateral pitting edema.  RLE worse than left.   Neurological: She is alert and oriented to person, place, and time. She has normal reflexes. No cranial nerve deficit or sensory deficit. Coordination normal.       5/5 and equal upper and lower extremity strength.   Skin: Skin is warm and dry. No rash noted.  Psychiatric: Her behavior is normal.       Pt is very anxious appearing and whiney.     ED Course  Procedures (including critical care time)   Date: 07/06/2011  Rate: 134  Rhythm: atrial fibrillation  QRS Axis: normal  Intervals: normal  ST/T Wave abnormalities: normal  Conduction Disutrbances:none  Narrative Interpretation:   Old EKG Reviewed: changes noted (no prior h/o atrial fibrillation; new since 2/11)   Labs Reviewed  BASIC METABOLIC PANEL - Abnormal; Notable for the following:    Glucose, Bld 163 (*)    BUN 40 (*)    Creatinine, Ser 1.90 (*)    GFR calc non Af Amer 26 (*)    GFR calc Af Amer 30 (*)    All other components within normal limits  URINALYSIS, ROUTINE W REFLEX MICROSCOPIC - Abnormal; Notable for the following:    Hgb urine dipstick LARGE (*)    Ketones, ur TRACE (*)    All other components within normal limits  CBC - Abnormal; Notable for the following:    Hemoglobin 11.8 (*)    All other components within normal limits  POCT I-STAT TROPONIN I  URINE MICROSCOPIC-ADD ON  DIFFERENTIAL  DRUGS OF ABUSE SCREEN W ALC, ROUTINE URINE   Dg Chest 2 View  07/06/2011  *RADIOLOGY REPORT*  Clinical Data: 71 year old female with  shortness of breath and fever.  CHEST - 2 VIEW  Comparison: 06/22/1998 and prior chest radiographs dating back to 1005  Findings: Upper limits of normal heart size again noted. Mild peribronchial thickening is unchanged. There is no evidence of focal airspace disease, pulmonary edema, suspicious pulmonary nodule/mass, pleural effusion, or pneumothorax. No acute bony abnormalities are identified.  IMPRESSION: No evidence of acute cardiopulmonary disease.  Mild chronic peribronchial thickening.  Original Report Authenticated By: Rosendo Gros, M.D.     1. Atrial fibrillation   2. Generalized weakness       MDM  71yo F presents w/ cc generalized weakness since this am w/ associated palpitations and dysuria.  Exam sig for no fever, rapid and irregular HR, lungs clear, bilateral LE edema, worse on right.  Orthostatic.  Pt ambulated by nursing staff and reportedly did not appear weak but became SOB.  EKG shows new atrial fibrillation w/ RVR.  Pt received a diltiazem bolus followed by drip.  CXR and U/A neg for infection.  Venous doppler RLE ordered to r/o DVT, particularly in setting of recent hospitalization, and is neg.  Pt admitted to [facility] for new onset a fib.       Arie Sabina Rand, [place] 07/06/11 1715

## 2011-07-06 NOTE — ED Notes (Signed)
Woke up today feeling weak,  Having difficulty walking due to weakness but states she did walk "up the hall", also reports headache which is coming and going

## 2011-07-06 NOTE — Progress Notes (Signed)
VASCULAR LAB PRELIMINARY  PRELIMINARY  PRELIMINARY  PRELIMINARY  Right lower extremity venous Dopplers completed.    Preliminary report:  No DVT or SVT noted in the right lower extremity.  Sherren Kerns Braddock, 07/06/2011, 12:22 PM

## 2011-07-06 NOTE — ED Notes (Signed)
Woke up today feeling weak and having an intermittent headache.  States no pain at this time.  Pt states she was able to ambulate this morning by herself.  Pt reports she had a stroke 2 weeks ago.  Pt tearful during interview.   Saline lock in place left [facility] as started prior to arrival by EMS, CBG prior to arrival per EMS was 174

## 2011-07-06 NOTE — Progress Notes (Signed)
ANTICOAGULATION CONSULT NOTE - Initial Consult  Pharmacy Consult for IV Heparin Indication:  Atrial fibrillation  No Known Allergies  Patient Measurements: Weight: 249 lb (112.946 kg) Ht:  65 inches  Vital Signs: BP: 126/70 mmHg (02/24 2005) Pulse Rate: 70  (02/24 2005)  Labs: [facility] 07/06/11 2145 07/06/11 1055  HGB -- 11.8*  HCT -- 36.0  PLT -- 175  APTT 26 --  LABPROT 14.1 --  INR 1.07 --  HEPARINUNFRC -- --  CREATININE -- 1.90*  CKTOTAL -- --  CKMB -- --  TROPONINI -- --   Medical History: Past Medical History  Diagnosis Date  . Normal exercise sestamibi stress test 2007    [facility]  . Diabetes mellitus   . Hyperlipidemia   . Hypertension   . Heart murmur   . Chronic kidney disease     CKD stage 3  . Depression   . Stroke 06/2011    TIA    Medications (PTA list):  Acetaminophen, Aspirin, Diclofenac gel, Duloxetine, Enalapril, Esomeprazole, Gabapentin, Hydrocodone/APAP, Novolog Insulin, Lantus Insulin, Meclizine, Ondansetron, Simvastatin  Assessment: 71 year-old female with generalized weakness and new onset atrial fibrillation.  During previous visit on 06/23/11, MRI head found questionable tiny acute left paracentral pontine infarct.  Discussed with Dr. Cyndra Numbers.  Will begin Heparin therapy without a bolus dose and target a level in the low end of the therapeutic range.   Goal of Therapy:  Heparin level 0.3-0.5 units/ml    Plan:   Baseline PT/INR and aPTT prior to starting Heparin.  Heparin IV infusion at 1400 units/hr.  Heparin level 6 hours after infusion started and daily.   CBC daily while receiving Heparin.  Polo Riley R.Ph. 07/06/2011,10:36 PM

## 2011-07-06 NOTE — ED Notes (Signed)
Attempted to call report to [facility], RN unable to take report at this time

## 2011-07-06 NOTE — ED Notes (Signed)
Manson Passey RN verified drip

## 2011-07-07 ENCOUNTER — Ambulatory Visit: Payer: Medicare HMO | Admitting: Family Medicine

## 2011-07-07 DIAGNOSIS — R531 Weakness: Secondary | ICD-10-CM

## 2011-07-07 DIAGNOSIS — E118 Type 2 diabetes mellitus with unspecified complications: Secondary | ICD-10-CM

## 2011-07-07 DIAGNOSIS — N183 Chronic kidney disease, stage 3 (moderate): Secondary | ICD-10-CM

## 2011-07-07 DIAGNOSIS — I4891 Unspecified atrial fibrillation: Principal | ICD-10-CM | POA: Diagnosis present

## 2011-07-07 LAB — CBC
MCV: 86.1 fL (ref 78.0–100.0)
Platelets: 164 10*3/uL (ref 150–400)
RBC: 3.67 MIL/uL — ABNORMAL LOW (ref 3.87–5.11)
RDW: 14.1 % (ref 11.5–15.5)
WBC: 7.3 10*3/uL (ref 4.0–10.5)

## 2011-07-07 LAB — COMPREHENSIVE METABOLIC PANEL
Albumin: 3 g/dL — ABNORMAL LOW (ref 3.5–5.2)
Alkaline Phosphatase: 65 U/L (ref 39–117)
BUN: 33 mg/dL — ABNORMAL HIGH (ref 6–23)
Creatinine, Ser: 1.66 mg/dL — ABNORMAL HIGH (ref 0.50–1.10)
GFR calc Af Amer: 35 mL/min — ABNORMAL LOW (ref >90)
Glucose, Bld: 245 mg/dL — ABNORMAL HIGH (ref 70–99)
Potassium: 4.2 mEq/L (ref 3.5–5.1)
Total Bilirubin: 0.5 mg/dL (ref 0.3–1.2)
Total Protein: 5.5 g/dL — ABNORMAL LOW (ref 6.0–8.3)

## 2011-07-07 LAB — TSH: TSH: 0.964 u[IU]/mL (ref 0.350–4.500)

## 2011-07-07 LAB — GLUCOSE, CAPILLARY
Glucose-Capillary: 212 mg/dL — ABNORMAL HIGH (ref 70–99)
Glucose-Capillary: 189 mg/dL — ABNORMAL HIGH (ref 70–99)

## 2011-07-07 LAB — CARDIAC PANEL(CRET KIN+CKTOT+MB+TROPI)
CK, MB: 2 ng/mL (ref 0.3–4.0)
Troponin I: <0.3 ng/mL (ref <0.30)

## 2011-07-07 MED ORDER — RIVAROXABAN 10 MG PO TABS
20.0000 mg | ORAL_TABLET | Freq: Every day | ORAL | Status: DC
  Filled 2011-07-07: qty 2

## 2011-07-07 MED ORDER — TRAZODONE HCL 50 MG PO TABS
50.0000 mg | ORAL_TABLET | Freq: Every evening | ORAL | Status: DC | PRN
  Administered 2011-07-07 – 2011-07-08 (×2): 50 mg via ORAL
  Filled 2011-07-07 (×4): qty 1

## 2011-07-07 MED ORDER — ASPIRIN EC 81 MG PO TBEC
81.0000 mg | DELAYED_RELEASE_TABLET | Freq: Every day | ORAL | Status: DC
  Filled 2011-07-07: qty 1

## 2011-07-07 MED ORDER — PANTOPRAZOLE SODIUM 40 MG PO TBEC
80.0000 mg | DELAYED_RELEASE_TABLET | Freq: Every day | ORAL | Status: DC
  Administered 2011-07-07 – 2011-07-09 (×3): 80 mg via ORAL
  Filled 2011-07-07 (×3): qty 2

## 2011-07-07 MED ORDER — DOCUSATE SODIUM 100 MG PO CAPS
100.0000 mg | ORAL_CAPSULE | Freq: Two times a day (BID) | ORAL | Status: DC
  Administered 2011-07-07 – 2011-07-08 (×3): 100 mg via ORAL
  Filled 2011-07-07 (×6): qty 1

## 2011-07-07 MED ORDER — ONDANSETRON HCL 4 MG PO TABS: 4.0000 mg | ORAL_TABLET | Freq: Four times a day (QID) | ORAL | Status: DC | PRN

## 2011-07-07 MED ORDER — INSULIN GLARGINE 100 UNIT/ML ~~LOC~~ SOLN
22.0000 [IU] | Freq: Every day | SUBCUTANEOUS | Status: DC
  Administered 2011-07-07: 22 [IU] via SUBCUTANEOUS

## 2011-07-07 MED ORDER — COUMADIN BOOK
1.0000 | Freq: Once | Status: AC
  Administered 2011-07-07: 1
  Filled 2011-07-07: qty 1

## 2011-07-07 MED ORDER — INSULIN ASPART 100 UNIT/ML ~~LOC~~ SOLN
0.0000 [IU] | Freq: Three times a day (TID) | SUBCUTANEOUS | Status: DC
  Administered 2011-07-07: 8 [IU] via SUBCUTANEOUS
  Administered 2011-07-07: 5 [IU] via SUBCUTANEOUS
  Administered 2011-07-07: 3 [IU] via SUBCUTANEOUS
  Administered 2011-07-08: 5 [IU] via SUBCUTANEOUS
  Administered 2011-07-08: 8 [IU] via SUBCUTANEOUS
  Administered 2011-07-08: 10 [IU] via SUBCUTANEOUS
  Administered 2011-07-09: 2 [IU] via SUBCUTANEOUS
  Administered 2011-07-09: 8 [IU] via SUBCUTANEOUS
  Administered 2011-07-09: 3 [IU] via SUBCUTANEOUS
  Filled 2011-07-07: qty 3

## 2011-07-07 MED ORDER — ACETAMINOPHEN 325 MG PO TABS
650.0000 mg | ORAL_TABLET | Freq: Three times a day (TID) | ORAL | Status: DC | PRN
  Administered 2011-07-07 – 2011-07-08 (×2): 650 mg via ORAL
  Filled 2011-07-07 (×2): qty 2

## 2011-07-07 MED ORDER — WARFARIN VIDEO: 1.0000 | Freq: Once | Status: DC

## 2011-07-07 MED ORDER — WARFARIN SODIUM 7.5 MG PO TABS
7.5000 mg | ORAL_TABLET | Freq: Once | ORAL | Status: DC
  Filled 2011-07-07: qty 1

## 2011-07-07 MED ORDER — DICLOFENAC SODIUM 1 % TD GEL: 1.0000 "application " | Freq: Four times a day (QID) | TRANSDERMAL | Status: DC | PRN

## 2011-07-07 MED ORDER — RIVAROXABAN 15 MG PO TABS
15.0000 mg | ORAL_TABLET | Freq: Every day | ORAL | Status: DC
  Administered 2011-07-07 – 2011-07-09 (×3): 15 mg via ORAL
  Filled 2011-07-07 (×3): qty 1

## 2011-07-07 MED ORDER — INSULIN GLARGINE 100 UNIT/ML ~~LOC~~ SOLN
20.0000 [IU] | Freq: Every day | SUBCUTANEOUS | Status: DC
  Filled 2011-07-07: qty 3

## 2011-07-07 MED ORDER — ASPIRIN EC 81 MG PO TBEC
81.0000 mg | DELAYED_RELEASE_TABLET | Freq: Every day | ORAL | Status: DC
  Administered 2011-07-07: 81 mg via ORAL
  Filled 2011-07-07: qty 1

## 2011-07-07 MED ORDER — INSULIN ASPART 100 UNIT/ML ~~LOC~~ SOLN
6.0000 [IU] | Freq: Three times a day (TID) | SUBCUTANEOUS | Status: DC
  Administered 2011-07-07 – 2011-07-08 (×4): 6 [IU] via SUBCUTANEOUS

## 2011-07-07 MED ORDER — HYDROCODONE-ACETAMINOPHEN 5-325 MG PO TABS: 1.0000 | ORAL_TABLET | Freq: Four times a day (QID) | ORAL | Status: DC | PRN

## 2011-07-07 MED ORDER — HEPARIN SODIUM (PORCINE) 5000 UNIT/ML IJ SOLN: 5000.0000 [IU] | Freq: Three times a day (TID) | INTRAMUSCULAR | Status: DC

## 2011-07-07 MED ORDER — SIMVASTATIN 40 MG PO TABS
40.0000 mg | ORAL_TABLET | Freq: Every day | ORAL | Status: DC
  Administered 2011-07-07: 40 mg via ORAL
  Filled 2011-07-07 (×2): qty 1

## 2011-07-07 MED ORDER — GABAPENTIN 100 MG PO CAPS
100.0000 mg | ORAL_CAPSULE | Freq: Every day | ORAL | Status: DC | PRN
Start: 2011-07-07 — End: 2011-07-09
  Filled 2011-07-07: qty 1

## 2011-07-07 MED ORDER — ONDANSETRON HCL 4 MG/2ML IJ SOLN
4.0000 mg | Freq: Four times a day (QID) | INTRAMUSCULAR | Status: DC | PRN
  Administered 2011-07-07: 4 mg via INTRAVENOUS
  Filled 2011-07-07: qty 2

## 2011-07-07 MED ORDER — HEPARIN SODIUM (PORCINE) 5000 UNIT/ML IJ SOLN
5000.0000 [IU] | Freq: Three times a day (TID) | INTRAMUSCULAR | Status: DC
  Filled 2011-07-07 (×4): qty 1

## 2011-07-07 MED ORDER — DILTIAZEM HCL 90 MG PO TABS
90.0000 mg | ORAL_TABLET | Freq: Three times a day (TID) | ORAL | Status: DC
  Administered 2011-07-07 – 2011-07-09 (×6): 90 mg via ORAL
  Filled 2011-07-07 (×10): qty 1

## 2011-07-07 NOTE — H&P (Signed)
Tina [name] is an 71 y.o. female.   Chief Complaint:  Atrial fibrillation with RVR HPI:  71 yo female with h/o DM, CKD3, recent stroke who presents from [facility] with a-fib and RVR. She describes feeling of "thumping in her chest" for a week that is associated with some shortness of breath with activity and at rest. She mentions that the home health nursing staff had noticed irregular heart rhythm in the last week or so. She denies any chest pain, any nausea or vomiting. She denies any caffeine or alcohol use.  She has never had anything like this before.   She has also been having some right leg pain for a week, associated with lateral numbness that starts at the bottom of her foot and goes up to her buttocks.   At [facility] ED, she had BP of 155/75 with HR of 136 and found to be in A-fib with RVR. She was given cardizem 20mg  IV x2 and metoprolol 5mg  IV x1. She was then started on a diltiazem drip and heparin before being transferred to [facility].   Past Medical History  Diagnosis Date  . Normal exercise sestamibi stress test 2007    [facility]  . Diabetes mellitus   . Hyperlipidemia   . Hypertension   . Heart murmur   . Chronic kidney disease     CKD stage 3  . Depression   . Stroke 06/2011    TIA     Past Surgical History  Procedure Date  . Bladder surgery 1994  . Knee surgery as child  . Abdominal hysterectomy     Family History  Problem Relation Age of Onset  . Coronary artery disease Mother   . Diabetes Mother   . Heart disease Mother   . Diabetes Sister   . Hypertension Sister   . Cancer Father    Social History:  reports that she has never smoked. She has never used smokeless tobacco. She reports that she does not drink alcohol or use illicit drugs.  Allergies: No Known Allergies  Medications Prior to Admission  Medication Dose Route Frequency Provider Last Rate Last Dose  . 0.9 %  sodium chloride infusion   Intravenous Once Peter A. Patrica Duel, MD 50  mL/hr at 07/06/11 2018    . acetaminophen (TYLENOL) tablet 650 mg  650 mg Oral Once Otilio Miu, PA   650 mg at 07/06/11 1713  . diltiazem (CARDIZEM) 100 mg in dextrose 5 % 100 mL infusion  5-15 mg/hr Intravenous Once Otilio Miu, PA 15 mL/hr at 07/06/11 1720 15 mg/hr at 07/06/11 1720  . diltiazem (CARDIZEM) 100 mg in dextrose 5 % 100 mL infusion  5-15 mg/hr Intravenous Continuous Meagan Hunt, MD 15 mL/hr at 07/07/11 0100 15 mg/hr at 07/07/11 0100  . diltiazem (CARDIZEM) injection 20 mg  20 mg Intravenous Once Otilio Miu, PA   20 mg at 07/06/11 1122  . diltiazem (CARDIZEM) injection 20 mg  20 mg Intravenous Once Cyndra Numbers, MD   20 mg at 07/06/11 2106  . heparin ADULT infusion 100 units/ml (25000 units/250 ml)  1,400 Units/hr Intravenous Continuous [facility], RPH 14 mL/hr at 07/07/11 0100 1,400 Units/hr at 07/07/11 0100  . metoprolol (LOPRESSOR) injection 5 mg  5 mg Intravenous Once Otilio Miu, PA   5 mg at 07/06/11 2005  . ondansetron (ZOFRAN) injection 4 mg  4 mg Intravenous Once Cyndra Numbers, MD   4 mg at 07/06/11 2355  .  DISCONTD: heparin bolus via infusion 2,500 Units  2,500 Units Intravenous Once [facility]       Medications Prior to Admission  Medication Sig Dispense Refill  . acetaminophen (TYLENOL) 650 MG CR tablet Take 650 mg by mouth every 8 (eight) hours as needed.        Marland Kitchen aspirin EC 81 MG tablet Take 1 tablet (81 mg total) by mouth daily.  30 tablet  11  . diclofenac sodium (VOLTAREN) 1 % GEL Apply 1 application topically 4 (four) times daily as needed. As needed for pain      . DULoxetine (CYMBALTA) 20 MG capsule Take 1 capsule (20 mg total) by mouth daily.  30 capsule  6  . enalapril (VASOTEC) 20 MG tablet Take 20 mg by mouth 2 (two) times daily.      Marland Kitchen esomeprazole (NEXIUM) 40 MG capsule Take 40 mg by mouth daily before breakfast.      . gabapentin (NEURONTIN) 100 MG capsule Take 100 mg by mouth 3  (three) times daily as needed. For pain      . HYDROcodone-acetaminophen (VICODIN) 5-500 MG per tablet Take 0.5-1 tablets by mouth every 6 (six) hours as needed. For pain      . insulin glargine (LANTUS) 100 UNIT/ML injection Inject 22 Units into the skin at bedtime. Please give Lantus Solostar Pens.      . meclizine (ANTIVERT) 12.5 MG tablet Take 12.5 mg by mouth 3 (three) times daily as needed. For nausea and dizziness      . ondansetron (ZOFRAN) 4 MG tablet Take 4 mg by mouth every 6 (six) hours as needed. For nausea      . simvastatin (ZOCOR) 40 MG tablet Take 40 mg by mouth at bedtime.      . Glucose Blood (ASCENSIA CONTOUR TEST VI)        . Insulin Pen Needle (BD ULTRA-FINE PEN NEEDLES) 29G X 12.7MM MISC 1 Container by Does not apply route daily.  100 each  9  . Insulin Syringe-Needle U-100 31G X 15/64" 1 ML MISC 20 Units by Does not apply route every morning.  30 each  11    Results for orders placed during the hospital encounter of 07/06/11 (from the past 48 hour(s))  BASIC METABOLIC PANEL     Status: Abnormal   Collection Time   07/06/11 10:55 AM      Component Value Range Comment   Sodium 142  135 - 145 (mEq/L)    Potassium 4.4  3.5 - 5.1 (mEq/L)    Chloride 108  96 - 112 (mEq/L)    CO2 22  19 - 32 (mEq/L)    Glucose, Bld 163 (*) 70 - 99 (mg/dL)    BUN 40 (*) 6 - 23 (mg/dL)    Creatinine, Ser 1.61 (*) 0.50 - 1.10 (mg/dL)    Calcium 9.0  8.4 - 10.5 (mg/dL)    GFR calc non Af Amer 26 (*) >90 (mL/min)    GFR calc Af Amer 30 (*) >90 (mL/min)   CBC     Status: Abnormal   Collection Time   07/06/11 10:55 AM      Component Value Range Comment   WBC 8.4  4.0 - 10.5 (K/uL)    RBC 4.18  3.87 - 5.11 (MIL/uL)    Hemoglobin 11.8 (*) 12.0 - 15.0 (g/dL)    HCT 09.6  04.5 - 40.9 (%)    MCV 86.1  78.0 - 100.0 (fL)  MCH 28.2  26.0 - 34.0 (pg)    MCHC 32.8  30.0 - 36.0 (g/dL)    RDW 16.1  09.6 - 04.5 (%)    Platelets 175  150 - 400 (K/uL)   DIFFERENTIAL     Status: Normal   Collection  Time   07/06/11 10:55 AM      Component Value Range Comment   Neutrophils Relative 70  43 - 77 (%)    Neutro Abs 5.9  1.7 - 7.7 (K/uL)    Lymphocytes Relative 21  12 - 46 (%)    Lymphs Abs 1.7  0.7 - 4.0 (K/uL)    Monocytes Relative 8  3 - 12 (%)    Monocytes Absolute 0.6  0.1 - 1.0 (K/uL)    Eosinophils Relative 2  0 - 5 (%)    Eosinophils Absolute 0.2  0.0 - 0.7 (K/uL)    Basophils Relative 1  0 - 1 (%)    Basophils Absolute 0.0  0.0 - 0.1 (K/uL)   URINALYSIS, ROUTINE W REFLEX MICROSCOPIC     Status: Abnormal   Collection Time   07/06/11 10:57 AM      Component Value Range Comment   Color, Urine YELLOW  YELLOW     APPearance CLEAR  CLEAR     Specific Gravity, Urine 1.014  1.005 - 1.030     pH 6.5  5.0 - 8.0     Glucose, UA NEGATIVE  NEGATIVE (mg/dL)    Hgb urine dipstick LARGE (*) NEGATIVE     Bilirubin Urine NEGATIVE  NEGATIVE     Ketones, ur TRACE (*) NEGATIVE (mg/dL)    Protein, ur NEGATIVE  NEGATIVE (mg/dL)    Urobilinogen, UA 0.2  0.0 - 1.0 (mg/dL)    Nitrite NEGATIVE  NEGATIVE     Leukocytes, UA NEGATIVE  NEGATIVE    URINE MICROSCOPIC-ADD ON     Status: Normal   Collection Time   07/06/11 10:57 AM      Component Value Range Comment   Squamous Epithelial / LPF RARE  RARE     WBC, UA 3-6  <3 (WBC/hpf)    RBC / HPF TOO NUMEROUS TO COUNT  <3 (RBC/hpf)    Bacteria, UA RARE  RARE    POCT I-STAT TROPONIN I     Status: Normal   Collection Time   07/06/11 10:59 AM      Component Value Range Comment   Troponin i, poc 0.01  0.00 - 0.08 (ng/mL)    Comment 3            TROPONIN I     Status: Normal   Collection Time   07/06/11  9:45 PM      Component Value Range Comment   Troponin I <0.30  <0.30 (ng/mL)   PROTIME-INR     Status: Normal   Collection Time   07/06/11  9:45 PM      Component Value Range Comment   Prothrombin Time 14.1  11.6 - 15.2 (seconds)    INR 1.07  0.00 - 1.49    APTT     Status: Normal   Collection Time   07/06/11  9:45 PM      Component Value Range  Comment   aPTT 26  24 - 37 (seconds)   GLUCOSE, CAPILLARY     Status: Abnormal   Collection Time   07/06/11 11:17 PM      Component Value Range Comment   Glucose-Capillary 115 (*) 70 -  99 (mg/dL)    Comment 1 Notify RN      Dg Chest 2 View  07/06/2011  *RADIOLOGY REPORT*  Clinical Data: 71 year old female with shortness of breath and fever.  CHEST - 2 VIEW  Comparison: 06/22/1998 and prior chest radiographs dating back to 1005  Findings: Upper limits of normal heart size again noted. Mild peribronchial thickening is unchanged. There is no evidence of focal airspace disease, pulmonary edema, suspicious pulmonary nodule/mass, pleural effusion, or pneumothorax. No acute bony abnormalities are identified.  IMPRESSION: No evidence of acute cardiopulmonary disease.  Mild chronic peribronchial thickening.  Original Report Authenticated By: Rosendo Gros, M.D.    ROS Denies any diarrhea. Has some occasional constipation.  She denies dysuria but has noticed that her urine is darker than usual. Denies polyuria.  Blood pressure 142/48, pulse 68, temperature 98.2 F (36.8 C), temperature source Oral, resp. rate 24, height 5\' 5"  (1.651 m), weight 242 lb 15.2 oz (110.2 kg), SpO2 97.00%. Physical Exam  Physical Examination: General appearance - alert and oriented x4, appears tearful and anxious Eyes - pupils equal and reactive, extraocular eye movements intact Mouth - moist mucous membranes Chest - normal respiratory effort with nasal canula in place, clear to auscultation, no wheezes, rales or rhonchi, symmetric air entry Heart - irregularly irregular rhythm, rate in the 80's, no murmurs, rubs or gallops appreciated Abdomen - bowel sounds present, soft, nontender, nondistended, no masses Neurological - alert, oriented, normal speech, no focal findings or movement disorder noted Lower extremities - 2+ edema bilaterally,right side mildly more edematous than left. Tenderness to palpation along right leg.  Sensation to palpation intact and symmetric bilaterally. 5/5 strength in upper and lower extremities bilaterally.   Assessment/Plan 71 yo female with h/o DM, CKD3, recent stroke who presents with new onset a-fib that seems to have started about one week ago.  1. New Onset a-fib: recent echo in 06/24/11 showed normal EF and grade 1 diastolic dysfunction making heart failure an unlikely cause for her new onset a-fib. Similarly, she doesn't appear to have a history of COPD that could have prompted this. Cardiac enzymes have been normal x2 making ischemic event unlikely. The only new medication change has been cymbalta which has a reported <1% chance of supraventricular arrhythmia. Will also check TSH to rule out any thyroid cause.  - continue diltiazem drip overnight: patient now rate controled in the 70's. Consider starting oral diltiazem in morning.  - since onset of afib appears to have been greater than 48hrs, patient is not candidate for immediate cardioversion. Will therefore stop heparin drip. - CHADS score of 4, putting patient at high risk for stroke. Will start coumadin - repeat EKG in am - cardiac enzymes now - consult cardiology in morning - continuous cardiac monitor on step down unit 2. Type 2 diabetes: A1C in February 2013: 12.6 - home lantus 20u qhs - meal coverage Novolog 6u at meal time - insulin sliding scale 3. CKD3 - baseline creatinine is 2, patient currently at baseline.  - CMP in am 4. Hematuria: large blood present in UA. Unclear whether this was a cath UA making for a traumatic urine sample. Presence of hematuria in older patient is not reassuring. Consider repeating UA during hospitalization in case of further workup.  5. 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 No focal findings on exam today.   - Continue ASA - PT/OT consult. Patient currently has PT/OT home health 6. Leg pain: likely secondary to  diabetic neuropathy. DVT was ruled  out earlier today with negative lower extremity doppler.  - d/c cymbalta - continue gabapentin 100mg  prn daily - home percocet 5/325 - tylenol 650mg  q6/prn 7. Depression/anxiety: - start trazadone for sleep - lorazepam 0.5mg  as needed for anxiety 8. Hyperlipidemia: continue home simvastatin 40mg  daily 9. Fen/Gi: - carb modified diet - saline lock IV 10: Prophylaxis: - colace in the context of percocet use - protonix 40mg  daily 11. Dispo:  - admit patient to step down unit.  - pending clinical improvement.  Marena Chancy 07/07/2011, 1:52 AM   I have examined the patient and discussed with Dr. Gwenlyn Saran.  Briefly:  Patient is a 71 year old female with recent hospitalization for vertiginous symptoms and found to have a questionable new pontine infarct on MRI only 2 weeks who presents to [facility] for today with one week's worth of palpitations. Patient denies any chest pain. She does state she has some shortness of breath and nausea when she experiences or palpitations. She describes the palpitations as if "something in my chest." She is concerned that the medication she was started on last hospitalization Cymbalta is causing her palpitations and she has no history of palpitations in the past. Also note she is complaining of right leg pain and lateral numbness. She states it has been present since last hospitalization.  She presented to COPD with heart rate to the 150s 160s. She was placed on Cardizem drip and transferred to the [facility] medicine teaching service.  Gen:  Alert, cooperative patient who appears stated age in no acute distress.  Vital signs reviewed. HEENT:  [place]/AT.  EOMI, PERRL.  MMM, tonsils non-erythematous, non-edematous.  External ears WNL, Bilateral TM's normal without retraction, redness or bulging.  Neck: No masses or thyromegaly or limitation in range of motion.  No cervical lymphadenopathy. Pulm:  Clear to auscultation bilaterally with good air movement.  No wheezes  or rales noted.   Cardiac:  Irregularly irregular rhythm. Grade 1/6 systolic ejection murmur noted. Abd:  Soft/nondistended/nontender.  Good bowel sounds throughout all four quadrants.  No masses noted.  Ext:  2+ edema bilateral lower extremities.   Neuro:  Cranial nerves II through XII intact. No decreased sensation bilateral lower extremities. Strength 5 out of 5 bilateral upper and lower extremities. Did not assess gait. Psych:  Somewhat anxious appearing  Assessment and plan: 71 year old female presenting today for new onset atrial fibrillation one week's duration:  1.Atrial fibrillation. This is a new problem for this patient. Symptoms have been present for about one week. She was started on heparin while in the emergency department however we have stopped this as symptoms have been present for greater than 48 hours. She may be candidate for cardioversion as this is her first episode of atrial fibrillation. Placed her on Coumadin therefore. She has a history of stroke and thus by CHADS2 score she qualifies for Coumadin we will keep her on a Cardizem drip and consult cardiology. Plan will be to transition her to by mouth Cardizem. Await cardiology recommendations regarding possible cardioversion. We also ruling out any acute coronary syndrome.  #2. Type 2 diabetes: Patient was started on Lantus last hospitalization. She she feels she's not sure if she requires insulin. She seems a very anxious person and needs much reassurance regarding her insulin regimen. We will continue to address this this hospitalization. Placed on sliding scale and her Lantus dose.  #3. Hematuria R. she will go been found in UA and possible EGD. Chest is history  of hematuria back to 2001. Plan to repeat UA. Would not want hematuria a 71 year old female to fall through the cracks.  #4 other chronic medical problems: Agree with Dr. Durward Fortes assessment and plan above. She will need addressing of her depression and anxiety.  #5  FEN GI: Placed on carb modified diet  #6. Prophylaxis subcutaneous prophylactic heparin  #7. Disposition: As consulted social work and physical therapy this patient is very adamant she does not want to go to a rehabilitation facility. However there is a record of her chart from the emergency department that her son feels she cannot take care of herself anymore. Disposition pending further workup and management.  [facility] 07/07/2011

## 2011-07-07 NOTE — Progress Notes (Signed)
Physical Therapy Evaluation Patient Details Name: Tina [name] MRN: [id number] DOB: [birth year] Today's Date: 07/07/2011  Problem List:  Patient Active Problem List  Diagnoses  . CARCINOMA, SKIN, SQUAMOUS CELL  . DM (diabetes mellitus) with complications  . DIABETIC PERIPHERAL NEUROPATHY  . HYPERLIPIDEMIA  . OBESITY NOS  . ANEMIA NEC  . ESSENTIAL HYPERTENSION  . GERD  . CHRONIC KIDNEY DISEASE STAGE III (MODERATE)  . DEGENERATIVE JOINT DISEASE  . INSOMNIA  . Shoulder pain  . Atrial fibrillation  . Generalized weakness    Past Medical History:  Past Medical History  Diagnosis Date  . Normal exercise sestamibi stress test 2007    [facility]  . Diabetes mellitus   . Hyperlipidemia   . Hypertension   . Heart murmur   . Chronic kidney disease     CKD stage 3  . Depression   . Stroke 06/2011    TIA    Past Surgical History:  Past Surgical History  Procedure Date  . Bladder surgery 1994  . Knee surgery as child  . Abdominal hysterectomy     PT Assessment/Plan/Recommendation PT Assessment Clinical Impression Statement: pt presents with A-fib, RVR, and uncontrolled DM.  pt anxious and with decreased awareness of deficits and safety.  pt would benefit from ST-SNF at D/C, but pt wouldn't let this PT finish her sentence about need for further rehab before stating "I'm not going to the Home!"  Attempted to educate pt about rehab at SNF, but pt not at all receptive.  Still feel SNF is safest D/C disposition as pt will need 24hr A.  If pt D/Cs to home will need HHPT, OT, RN, and Aide with any family A that can be provided.   PT Recommendation/Assessment: Patient will need skilled PT in the acute care venue PT Problem List: Decreased strength;Decreased activity tolerance;Decreased balance;Decreased mobility;Decreased knowledge of use of DME;Pain Barriers to Discharge: Decreased caregiver support PT Therapy Diagnosis : Difficulty walking;Acute pain PT Plan PT Frequency: Min  3X/week PT Treatment/Interventions: DME instruction;Gait training;Stair training;Functional mobility training;Therapeutic activities;Therapeutic exercise;Balance training;Neuromuscular re-education;Patient/family education;Cognitive remediation PT Recommendation Recommendations for Other Services: OT consult Follow Up Recommendations: Skilled nursing facility Equipment Recommended: Defer to next venue PT Goals  Acute Rehab PT Goals PT Goal Formulation: With patient Time For Goal Achievement: 2 weeks Pt will go Supine/Side to Sit: Independently PT Goal: Supine/Side to Sit - Progress: Goal set today Pt will go Sit to Supine/Side: Independently PT Goal: Sit to Supine/Side - Progress: Goal set today Pt will go Sit to Stand: with supervision;with upper extremity assist PT Goal: Sit to Stand - Progress: Goal set today Pt will go Stand to Sit: with supervision;with upper extremity assist PT Goal: Stand to Sit - Progress: Goal set today Pt will Ambulate: >150 feet;with rolling walker;with supervision PT Goal: Ambulate - Progress: Goal set today Pt will Go Up / Down Stairs: 3-5 stairs;with min assist;with least restrictive assistive device PT Goal: Up/Down Stairs - Progress: Goal set today  PT Evaluation Precautions/Restrictions  Precautions Precautions: Fall Restrictions Weight Bearing Restrictions: No Prior Functioning  Home Living Lives With: Alone Receives Help From:  (HHPT and RN and Aides) Type of Home: Mobile home Home Layout: One level Home Access: Stairs to enter Entrance Stairs-Rails: Can reach both Entrance Stairs-Number of Steps: 6 Bathroom Toilet: Standard Bathroom Accessibility: Yes How Accessible: Accessible via walker Home Adaptive Equipment: Bedside commode/3-in-1;Raised toilet seat with rails;Straight cane;Walker - rolling Arts administrator) Prior Function Level of Independence: Independent with basic ADLs;Independent with homemaking  with ambulation;Independent with  gait;Independent with transfers;Requires assistive device for independence Able to Take Stairs?: Yes Driving: No Vocation: Retired Designer, television/film set Level: Oriented X4 Safety/Judgement: Decreased safety judgement for tasks assessed Decreased Safety/Judgement: Decreased awareness of need for assistance Sensation/Coordination   Extremity Assessment RLE Assessment RLE Assessment: Exceptions to [facility] RLE Strength RLE Overall Strength Comments: pt grossly 4/5 LLE Assessment LLE Assessment: Exceptions to [facility] LLE Strength LLE Overall Strength Comments: pt grossly 4/5 Mobility (including Balance) Bed Mobility Bed Mobility: Yes Supine to Sit: 4: Min assist;HOB elevated (Comment degrees) (HOB ~45degrees) Supine to Sit Details (indicate cue type and reason): pt requires cues for use of UEs, sequencing, and A for hips Sitting - Scoot to Edge of Bed: 3: Mod assist Sitting - Scoot to Edge of Bed Details (indicate cue type and reason): cues for reciprocal scoot and A needed for hips.   Transfers Transfers: Yes Sit to Stand: 4: Min assist;With upper extremity assist;From bed Sit to Stand Details (indicate cue type and reason): Max cueing for use of UEs as pt tends to pull on RW and needs visual cues to demonstrate safe technique.   Stand to Sit: 4: Min assist;To chair/3-in-1 Stand to Sit Details: Max cueing again for safe use of UEs as pt just falls back into chair without use of UEs or controlling descent.   Ambulation/Gait Ambulation/Gait: Yes Ambulation/Gait Assistance: 4: Min assist Ambulation/Gait Assistance Details (indicate cue type and reason): cues for safe use of RW, upright posture, encouragement for ambulation.  pt c/o feeling very fatigued towards end of gait.  pt's HR ranged from 90's-143 max, but nonsustained.   Ambulation Distance (Feet): 90 Feet Assistive device: Rolling walker Gait Pattern: Step-through pattern;Decreased stride length;Shuffle;Trunk flexed Stairs:  No    Exercise    End of Session PT - End of Session Equipment Utilized During Treatment: Gait belt Activity Tolerance: Patient tolerated treatment well Patient left: in chair;with call bell in reach Nurse Communication: Mobility status for transfers;Mobility status for ambulation General Behavior During Session: [facility] for tasks performed Cognition: Impaired Cognitive Impairment: pt with decreased safety awareness and seems mildly anxious.  pt not aware of extent of A needed for mobility and ADLS for safety.    Sunny Schlein, [place] [phone] 07/07/2011, 2:58 PM

## 2011-07-07 NOTE — H&P (Signed)
Family Medicine Teaching Service Attending Note  I interviewed and examined patient Tina [name] and reviewed their tests and x-rays.  I discussed with Dr. Gwendolyn Grant and reviewed their note for today.  I agree with their assessment and plan.     Additionally  This AM feels better Awake alert eating breakfast without chest pain or shortness of breath New Onset Afib - change to oral CCB for rate control.  I would recommend rate control and anticoagulation with Xarelto at least initially.  If is symptomatic would consult cardiology probably as an outpatient Consult physical therapy  Continue Lantus  Stop Asa Hematuria - recheck UA  Anxiety - she is quite anxious at baseline  Currently she is concerned that she will end up in a NH.  Reassured we will aim to get her back home

## 2011-07-07 NOTE — Progress Notes (Signed)
Inpatient Diabetes Program Recommendations  AACE/ADA: New Consensus Statement on Inpatient Glycemic Control (2009)  Target Ranges:  Prepandial:   less than 140 mg/dL      Peak postprandial:   less than 180 mg/dL (1-2 hours)      Critically ill patients:  140 - 180 mg/dL   Noted patient recently started on insulin during last admission to hospital (02/11 through 02/13).  Was started on Lantus solostar pen 22 units QHS.  A1C from last admission 12.6% (06/24/11).  Noted Social Work consult for possible placement for Rehab after d/c.    Inpatient Diabetes Program Recommendations Diet: Please change diet to Carbohydrate Modified Medium diet (currently on Regular diet)  Note: Will follow. Ambrose Finland RN, MSN, CDE Diabetes Coordinator Inpatient Diabetes Program [phone]

## 2011-07-07 NOTE — ED Provider Notes (Signed)
Medical screening examination/treatment/procedure(s) were conducted as a shared visit with non-physician practitioner(s) and myself.  I personally evaluated the patient during the encounter   Tina [name] A. Beatrice Ziehm, MD 07/07/11 2310 

## 2011-07-07 NOTE — Progress Notes (Addendum)
PHARMACY - ANTICOAGULATION CONSULT FOLLOW-UP NOTE  Pharmacy Consult for: Heparin, Coumadin  Indication: atrial fibrillation   Patient Data:   Allergies: No Known Allergies  Patient Measurements: Height: 5\' 5"  (165.1 cm) Weight: 242 lb 15.2 oz (110.2 kg) IBW/kg (Calculated) : 57  Adjusted Body Weight: 83 kg   Vital Signs: Temp:  [98.2 F (36.8 C)-99 F (37.2 C)] 98.2 F (36.8 C) (02/25 0100) Pulse Rate:  [50-136] 68  (02/25 0100) Resp:  [16-26] 24  (02/25 0100) BP: (114-166)/(48-111) 142/48 mmHg (02/25 0100) SpO2:  [96 %-100 %] 97 % (02/25 0100) FiO2 (%):  [2 %] 2 % (02/25 0030) Weight:  [242 lb 15.2 oz (110.2 kg)-249 lb (112.946 kg)] 242 lb 15.2 oz (110.2 kg) (02/25 0030)  Intake/Output from previous day:  Intake/Output Summary (Last 24 hours) at 07/07/11 0224 Last data filed at 07/06/11 2313  Gross per 24 hour  Intake    800 ml  Output    800 ml  Net      0 ml    Labs:  Basename 07/06/11 2145 07/06/11 1055  HGB -- 11.8*  HCT -- 36.0  PLT -- 175  APTT 26 --  LABPROT 14.1 --  INR 1.07 --  HEPARINUNFRC -- --  CREATININE -- 1.90*  CKTOTAL -- --  CKMB -- --  TROPONINI <0.30 --   Estimated Creatinine Clearance: 34.1 ml/min (by C-G formula based on Cr of 1.9).  Scheduled Medications:     . sodium chloride   Intravenous Once  . acetaminophen  650 mg Oral Once  . diltiazem (CARDIZEM) infusion  5-15 mg/hr Intravenous Once  . diltiazem  20 mg Intravenous Once  . diltiazem  20 mg Intravenous Once  . insulin aspart  0-15 Units Subcutaneous TID WC  . metoprolol  5 mg Intravenous Once  . ondansetron (ZOFRAN) IV  4 mg Intravenous Once  . DISCONTD: heparin  2,500 Units Intravenous Once     Assessment:  71 y.o. female  With atrial fibrillation transferred from [facility] on IV heparin at 1400 units/hr. Pharmacy consulted to manage Coumadin. Per 06/23/11 MRI = questionable tiny acute left paracentral pontine infarct.   Goal of Therapy:  Heparin level 0.3 -  0.5   Plan:  1. Continue IV heparin at 1400 units/hr.  2. Follow-up 0700 heparin level, CBC. 3. Daily CBC, heparin level.  4.   Coumadin 7.5mg  po x 1 at 18:00 today. 5.   Daily PT / INR 6.   Coumadin book / video  Dineen Kid. Akesha Uresti, PharmD 07/07/2011, 2:24 AM   Addendum: Heparin IV infusion discontinued per physician order. Coumadin consult continued.   Lorre Munroe, PharmD 07/07/11 @ 03:00 AM

## 2011-07-07 NOTE — Plan of Care (Signed)
Problem: Consults Goal: Diabetes Guidelines if Diabetic/Glucose > 140 If diabetic or lab glucose is > 140 mg/dl - Initiate Diabetes/Hyperglycemia Guidelines & Document Interventions  Outcome: Progressing SSI ordered  Problem: Phase I Progression Outcomes Goal: Pain controlled with appropriate interventions Outcome: Progressing Pt can have analgesics for R leg pain as necessary    Goal: Voiding-avoid urinary catheter unless indicated Outcome: Progressing Pt uses bedpan or female urinal without difficulty Goal: Hemodynamically stable Outcome: Progressing Pt currently on diltiazem drip for new onset a fib. HR from 130s initially now down to 70s-80s.

## 2011-07-08 LAB — GLUCOSE, CAPILLARY
Glucose-Capillary: 288 mg/dL — ABNORMAL HIGH (ref 70–99)
Glucose-Capillary: 203 mg/dL — ABNORMAL HIGH (ref 70–99)

## 2011-07-08 LAB — CBC
HCT: 31.9 % — ABNORMAL LOW (ref 36.0–46.0)
Hemoglobin: 10.3 g/dL — ABNORMAL LOW (ref 12.0–15.0)
MCH: 28 pg (ref 26.0–34.0)
MCHC: 32.3 g/dL (ref 30.0–36.0)

## 2011-07-08 LAB — BASIC METABOLIC PANEL
BUN: 33 mg/dL — ABNORMAL HIGH (ref 6–23)
Chloride: 109 mEq/L (ref 96–112)
Creatinine, Ser: 2.04 mg/dL — ABNORMAL HIGH (ref 0.50–1.10)
Glucose, Bld: 238 mg/dL — ABNORMAL HIGH (ref 70–99)
Potassium: 4.3 mEq/L (ref 3.5–5.1)

## 2011-07-08 MED ORDER — INSULIN ASPART 100 UNIT/ML ~~LOC~~ SOLN
7.0000 [IU] | Freq: Three times a day (TID) | SUBCUTANEOUS | Status: DC
  Administered 2011-07-08 – 2011-07-09 (×5): 7 [IU] via SUBCUTANEOUS

## 2011-07-08 MED ORDER — INSULIN GLARGINE 100 UNIT/ML ~~LOC~~ SOLN
24.0000 [IU] | Freq: Every day | SUBCUTANEOUS | Status: DC
  Administered 2011-07-08: 24 [IU] via SUBCUTANEOUS

## 2011-07-08 MED ORDER — ATORVASTATIN CALCIUM 10 MG PO TABS
20.0000 mg | ORAL_TABLET | Freq: Every day | ORAL | Status: DC
  Administered 2011-07-08 – 2011-07-09 (×2): 20 mg via ORAL
  Filled 2011-07-08 (×2): qty 2

## 2011-07-08 NOTE — Progress Notes (Signed)
Physical Therapy Treatment Patient Details Name: Tina [name] MRN: [id number] DOB: 1941/02/25 Today's Date: 07/08/2011  PT Assessment/Plan  PT - Assessment/Plan Comments on Treatment Session: Did well with participation today. Most difficulty getting up and down from chair (although this might be baseline as she uses lift chair at home). Ed pt on benefits of ST-SNF but if pt refuses SNF rec. HHPT.  PT Plan: Discharge plan remains appropriate;Frequency remains appropriate Recommendations for Other Services: OT consult Follow Up Recommendations: Skilled nursing facility Equipment Recommended: Defer to next venue PT Goals  Acute Rehab PT Goals PT Goal: Sit to Stand - Progress: Progressing toward goal PT Goal: Stand to Sit - Progress: Progressing toward goal PT Transfer Goal: Bed to Chair/Chair to Bed - Progress: Progressing toward goal PT Goal: Ambulate - Progress: Progressing toward goal  PT Treatment Precautions/Restrictions  Precautions Precautions: Fall Restrictions Weight Bearing Restrictions: No Mobility (including Balance) Bed Mobility Bed Mobility: No Transfers Sit to Stand: 3: Mod assist Sit to Stand Details (indicate cue type and reason): cues for sequencing and assist for follow through to stand  (pt reports using lift chair at home) Stand to Sit: 4: Min assist Stand to Sit Details: cues for techncique and to control descent Ambulation/Gait Ambulation/Gait Assistance: 4: Min assist Ambulation/Gait Assistance Details (indicate cue type and reason): amb approx 95 ft with RW; shuffled feet and flexed trunk, cues for posture and safety with RW Ambulation Distance (Feet): 95 Feet Assistive device: Rolling walker Gait Pattern: Trunk flexed;Shuffle Stairs: No    Exercise  General Exercises - Lower Extremity Ankle Circles/Pumps: AROM;Both;20 reps;Seated Long Arc Quad: AROM;10 reps;Both;Seated Heel Slides: AROM;Both;5 reps;Seated Hip Flexion/Marching: AROM;10  reps;Standing (bilateral upper extremity support) End of Session PT - End of Session Equipment Utilized During Treatment: Gait belt Activity Tolerance: Patient tolerated treatment well (limited exercises because of right knee pain) Patient left: in chair;with call bell in reach Nurse Communication: Mobility status for transfers;Mobility status for ambulation General Behavior During Session: [facility] for tasks performed Cognition: Impaired Cognitive Impairment: willing to listen to social worker concerning possible SNF but still reporting she won't need it  [facility] HELEN 07/08/2011, 12:47 PM

## 2011-07-08 NOTE — Progress Notes (Signed)
I discussed with Dr Losq.  I agree with their plans documented in their  Note for today.   

## 2011-07-08 NOTE — Evaluation (Signed)
Occupational Therapy Evaluation Patient Details Name: Tina [name] MRN: [id number] DOB: 11/23/1940 Today's Date: 07/08/2011  Problem List:  Patient Active Problem List  Diagnoses  . CARCINOMA, SKIN, SQUAMOUS CELL  . DM (diabetes mellitus) with complications  . DIABETIC PERIPHERAL NEUROPATHY  . HYPERLIPIDEMIA  . OBESITY NOS  . ANEMIA NEC  . ESSENTIAL HYPERTENSION  . GERD  . CHRONIC KIDNEY DISEASE STAGE III (MODERATE)  . DEGENERATIVE JOINT DISEASE  . INSOMNIA  . Shoulder pain  . Atrial fibrillation  . Generalized weakness    Past Medical History:  Past Medical History  Diagnosis Date  . Normal exercise sestamibi stress test 2007    [facility]  . Diabetes mellitus   . Hyperlipidemia   . Hypertension   . Heart murmur   . Chronic kidney disease     CKD stage 3  . Depression   . Stroke 06/2011    TIA    Past Surgical History:  Past Surgical History  Procedure Date  . Bladder surgery 1994  . Knee surgery as child  . Abdominal hysterectomy     OT Assessment/Plan/Recommendation OT Assessment Clinical Impression Statement: Pt admitted with A-fib, RVR, and uncontrolled DM. Recommend ST-SNF for d/c plan, but pt is refusing SNF. Therefore, recommend HHOT. Will benefit from skilled OT in the acute setting for to increase I with ADL and ADL mobility to Mod I-I level prior to d/c home  OT Recommendation/Assessment: Patient will need skilled OT in the acute care venue OT Problem List: Decreased activity tolerance;Decreased knowledge of use of DME or AE;Decreased safety awareness;Decreased knowledge of precautions;Obesity OT Therapy Diagnosis : Generalized weakness OT Plan OT Frequency: Min 2X/week OT Treatment/Interventions: Self-care/ADL training;Therapeutic exercise;DME and/or AE instruction;Therapeutic activities;Patient/family education OT Recommendation Follow Up Recommendations: Home health OT Equipment Recommended: Tub/shower bench Individuals Consulted Consulted and  Agree with Results and Recommendations: Patient OT Goals Acute Rehab OT Goals OT Goal Formulation: With patient Time For Goal Achievement: 7 days ADL Goals Pt Will Perform Grooming: Independently;Standing at sink ADL Goal: Grooming - Progress: Goal set today Pt Will Perform Lower Body Bathing: with modified independence;Sit to stand in shower ADL Goal: Lower Body Bathing - Progress: Goal set today Pt Will Perform Lower Body Dressing: Independently;Sit to stand from chair ADL Goal: Lower Body Dressing - Progress: Goal set today Pt Will Transfer to Toilet: Independently;3-in-1 ADL Goal: Toilet Transfer - Progress: Goal set today Pt Will Perform Toileting - Clothing Manipulation: Independently;Standing ADL Goal: Toileting - Clothing Manipulation - Progress: Goal set today Pt Will Perform Tub/Shower Transfer: Tub transfer;Transfer tub bench ADL Goal: Tub/Shower Transfer - Progress: Goal set today  OT Evaluation Precautions/Restrictions  Precautions Precautions: Fall Restrictions Weight Bearing Restrictions: No Prior Functioning Home Living Lives With: Alone Receives Help From: Family Type of Home: Mobile home Home Layout: One level Home Access: Stairs to enter Entrance Stairs-Rails: Can reach both Entrance Stairs-Number of Steps: 6 Bathroom Shower/Tub: Engineer, manufacturing systems: Standard Bathroom Accessibility: Yes How Accessible: Accessible via walker Home Adaptive Equipment: Bedside commode/3-in-1;Raised toilet seat with rails;Straight cane;Walker - rolling Prior Function Level of Independence: Independent with basic ADLs;Independent with homemaking with ambulation;Independent with gait;Independent with transfers;Requires assistive device for independence Able to Take Stairs?: Yes Driving: No Vocation: Retired ADL ADL Eating/Feeding: Simulated;Independent Where Assessed - Eating/Feeding: Chair Grooming: Performed;Supervision/safety;Set up Where Assessed -  Grooming: Standing at sink Upper Body Bathing: Performed;Set up;Supervision/safety Where Assessed - Upper Body Bathing: Sitting, chair Lower Body Bathing: Performed;Minimal assistance Lower Body Bathing Details (indicate cue type and reason):  Min A for back pericare Where Assessed - Lower Body Bathing: Sit to stand from chair Upper Body Dressing: Performed;Set up;Supervision/safety Upper Body Dressing Details (indicate cue type and reason): gown Where Assessed - Upper Body Dressing: Sitting, chair Lower Body Dressing: Performed;Minimal assistance Lower Body Dressing Details (indicate cue type and reason): to don and doff socks Where Assessed - Lower Body Dressing: Sitting, chair Toilet Transfer: Performed;Minimal assistance Toilet Transfer Details (indicate cue type and reason): Min hand-held assist. Mod A for sit to stand with armrests.  Toilet Transfer Method: Proofreader: Set designer - Clothing Manipulation: Performed;Supervision/safety;Set up Where Assessed - Toileting Clothing Manipulation: Standing Toileting - Hygiene: Performed;Minimal assistance Toileting - Hygiene Details (indicate cue type and reason): Pt. with BM and attempting to wipe from back to front as this is easier for her. Educated pt to wipe from front to back- as pt unable to do this, instructed to wipe front and back separately to avoid infection Where Assessed - Toileting Hygiene: Standing Tub/Shower Transfer: Not assessed Ambulation Related to ADLs: Supervision/Min guard A throughout room with no LOB Vision/Perception  Vision - History Patient Visual Report: No change from baseline Cognition Cognition Orientation Level: Oriented X4 Cognition - Other Comments: Pt with some child-like comments and tendencies. However, able to answer all questions appropriately. Will continue to monitor, especially for safety Sensation/Coordination Sensation Light Touch: Appears  Intact Coordination Gross Motor Movements are Fluid and Coordinated: Yes Fine Motor Movements are Fluid and Coordinated: Yes Extremity Assessment RUE Assessment RUE Assessment: Within Functional Limits LUE Assessment LUE Assessment: Within Functional Limits Mobility  Bed Mobility Bed Mobility: No Transfers Sit to Stand: 3: Mod assist Sit to Stand Details (indicate cue type and reason): cues for sequencing and assist for follow through to stand  (pt reports using lift chair at home) Stand to Sit: 4: Min assist Stand to Sit Details: cues for techncique and to control descent  End of Session OT - End of Session Equipment Utilized During Treatment: Gait belt Activity Tolerance: Patient tolerated treatment well General Behavior During Session: [facility] for tasks performed Cognition: Impaired Cognitive Impairment: willing to listen to social worker concerning possible SNF but still reporting she won't need it   Karolyne Timmons 07/08/2011, 3:50 PM

## 2011-07-08 NOTE — Progress Notes (Signed)
Clinical Child psychotherapist (CSW) completed psychosocial assessment which can be found in pt shadow chart. CSW spoke with pt early today over the phone and pt stated she was agreeable to placement at [facility] and also consented for CSW to speak to speak to her son Tina [name]. CSW spoke with son who informed CSW that he is very concerned about pt living alone as she has times of confusion. Pt son would like pt to be placed in a skilled nursing facility for ST rehab.   CSW visited pt room later to provide bed offers and inform her that unfortunately Tina [name] is not contracted with pt insurance. Pt stated she wanted to go home and was not agreeable to placement as she feels she is fine at home. Pt informed CSW that she lives alone however has neighbors who are helpful. Pt and CSW spent much time going through the skilled nursing facility list and pt eventually stated the only facility she would consider would be [facility]. CSW contacted the facility who confirmed CSW that they could offer pt placement when stable. Pt informed CSW that she would like to speak to son and spend sometime thinking placement. CSW will return tomorrow to speak to with pt.   Tina [name], MSW, Tina [name] [phone]

## 2011-07-08 NOTE — Progress Notes (Signed)
Subjective: Tele shows that patient converted out of a-fib around 6:30pm last night. Has been in sinus rhythm since.  Otherwise no acute events. Patient is still having some discomfort in her right leg. She is no longer feeling the thumping in her chest.   Objective: Vital signs in last 24 hours: Temp:  [96.9 F (36.1 C)-98.9 F (37.2 C)] 98.6 F (37 C) (02/26 0453) Pulse Rate:  [72-88] 88  (02/26 0453) Resp:  [18-24] 19  (02/26 0453) BP: (115-145)/(54-66) 129/66 mmHg (02/26 0453) SpO2:  [92 %-100 %] 92 % (02/26 0453) Weight change:  Last BM Date: 07/07/11  Intake/Output from previous day: 02/25 0701 - 02/26 0700 In: 417.3 [P.O.:253; I.V.:162.3; IV Piggyback:2] Out: 351 [Urine:350; Stool:1] Intake/Output this shift:    General appearance: alert, cooperative and sitting in chair. Appears to be in fairly good spirits Resp: clear to auscultation bilaterally Cardio: Regular rate and rhythm, different from yesterday, 1/6 flow murmur GI: soft, non-tender; bowel sounds normal; no masses,  no organomegaly Extremities: edema in lower extremities, mildly more prominent in right than left. Tenderness to touch on right leg. 5/5 strength in lower and upper extremities bilaterally, sensation to light touch intact in lower extremities bilaterally   Lab Results:  Basename 07/08/11 0425 07/07/11 0331  WBC 7.1 7.3  HGB 10.3* 10.3*  HCT 31.9* 31.6*  PLT 164 164   BMET  Basename 07/08/11 0425 07/07/11 0331  NA 141 142  K 4.3 4.2  CL 109 111  CO2 22 20  GLUCOSE 238* 245*  BUN 33* 33*  CREATININE 2.04* 1.66*  CALCIUM 8.6 8.8    Studies/Results: No results found.  Medications: I have reviewed the patient's current medications.  Assessment/Plan: 71 yo female with h/o DM, CKD3, recent stroke who presented with new onset a-fib. Patient was started on rate control with diltiazem. A-fib appears to have converted back to sinus rhythm for now.   1. New Onset a-fib:  - transitioned  yesterday from diltiazem drip to oral diltiazem 90mg  q8 with good rate control in the 60-80's.  - rhythm currently sinus - TSH was normal making hyperthyroid unlikely cause  - continue xarelto 15mg  daily  2. Type 2 diabetes: A1C in February 2013: 12.6. Glucose between 209 and 292 in the last 24hrs.  - increase lantus from 22 to 24u - increase meal coverage to 7u - continue ISS   3. CKD3  - baseline creatinine is 2, patient currently at baseline.  - BMP tomorrow 4. Hematuria: large blood present in UA.  - repeat UA as outpatient in a couple months 5. 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 No focal findings on exam today.  - ASA stopped given that patient is on xarelto - PT/OT consult. Patient currently has PT/OT home health. PT recommended SNF but patient is very reluctant to go to a facility.   6. Leg pain: likely secondary to diabetic neuropathy. DVT was ruled out earlier today with negative lower extremity doppler.  - d/c cymbalta  - continue gabapentin 100mg  prn daily  - home percocet 5/325  - tylenol 650mg  q6/prn  7. Depression/anxiety:  - start trazadone for sleep  8. Hyperlipidemia: continue home simvastatin 40mg  daily  9. Fen/Gi:  - carb modified diet  - saline lock IV  10: Prophylaxis:  - colace in the context of percocet use  - protonix 40mg  daily  11. Dispo:  - consulted social work to help with figuring out what patient would like for  rehab as PT recommends SNF and patient is reluctant to go to facility.    LOS: 2 days   Kent Braunschweig 07/08/2011, 1:22 PM

## 2011-07-08 NOTE — Discharge Summary (Signed)
Physician Discharge Summary   Patient ID: Tina [name] [id number] 71 y.o. [birth year]  Admit date: 07/06/2011  Discharge date and time: No discharge date for patient encounter.   Admitting Physician: Carney Living, MD   Discharge Physician: Tawanna Cooler McDiarmid, MD  Admission Diagnoses:  New onset Atrial fibrillation [427.31]  Discharge Diagnoses:  1. Atrial Fibrillation 2. Typer 2 diabetes 3. History of TIA 4. Hypertension 5. Chronic Kidney Disease type 3 6. Degenerative Joint Disease  Admission Condition: fair  Discharged Condition: good  Indication for Admission: new onset a-fib  Hospital Course:  71 year old female with history of type 2 diabetes, chronic kidney disease stage III hyperlipidemia and recent hospitalization in early February for left paracentral pontine infarct who presented with heart palpitations and shortness of breath secondary to newly diagnosed atrial fibrillation. 1. A. Fib: Heart rate in the ED was in the 130s, with atrial fibrillation on EKG. Patient's blood pressures were stable. Patient was started on a diltiazem drip and heparin drip and sent from [facility] to [facility]. On admission heart rate was irregular in the 80s. Heparin was stopped since there were no plans to cardiovert with symptom onset>48hrs. Given patient's previous history of stroke, she was initially started on Coumadin which was later in the day changed to Xarelto. She was transitioned from the diltiazem drip to oral diltiazem 90 mg 3 times daily. Looking for reversible causes of atrial fibrillation such as hyperthyroid, CAD or heart failure, TSH was found to be normal at 0.964, cardiac enzymes were negative x3 and previous echo from February 2013 showed normal EF with type I diastolic dysfunction. Patient did mention that she started having symptoms shortly after starting Cymbalta. This medication was stopped on admission.  Patient converted back to sinus rhythm during  hospitalization. She was discharged on xarelto and Diltiazem ER 270mg  daily.   2. type 2 diabetes with A1c 12.6. Patient was started back on home Lantus 22 units each bedtime with meal coverage NovoLog 6 units and insulin sliding scale. She was discharged on lantus 30u without meal coverage. Blood glucose on discharge was 145-288.  3. CKD3: Creatinine at 1.6 close to baseline of 2.  4. Hematuria: large blood present in UA. Unclear whether this was a cath UA making for a traumatic urine sample. Repeat UA as an outpatient.  5. 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 There are no focal findings on exam on admission.  PT OT work consult had and physical therapy recommended SNF rehabilitation   6. Leg pain: likely secondary to diabetic neuropathy. DVT was ruled out  with negative lower extremity doppler. Patient kept on gabapentin 100 mg when necessary daily, Vicodin 5/325 q8/prn and Tylenol 650 mg   7. Depression/anxiety: Patient does have a reported history of anxiety and depression for which Cymbalta was started. Patient was discharged on celexa 20mg  daily.   8. Hyperlipidemia: continue home simvastatin 40mg  daily 9. Hypertension: Patient was discharged on enalapril 20mg  daily  Consults: 1. Physical and Occupational Therapy  Significant Diagnostic Studies:   Right Lower Extremity Doppler: 07/07/11 No DVT or SVT noted in the right lower extremity.  Urinalysis: 07/06/11  Ref. Range 07/06/2011 10:57  Color, Urine Latest Range: YELLOW  YELLOW  APPearance Latest Range: CLEAR  CLEAR  Specific Gravity, Urine Latest Range: 1.005-1.030  1.014  pH Latest Range: 5.0-8.0  6.5  Glucose, UA Latest Range: NEGATIVE mg/dL NEGATIVE  Bilirubin Urine Latest Range: NEGATIVE  NEGATIVE  Ketones, ur Latest Range:  NEGATIVE mg/dL TRACE (A)  Protein Latest Range: NEGATIVE mg/dL NEGATIVE  Urobilinogen, UA Latest Range: 0.0-1.0 mg/dL 0.2  Nitrite Latest Range: NEGATIVE   NEGATIVE  Leukocytes, UA Latest Range: NEGATIVE  NEGATIVE  Hgb urine dipstick Latest Range: NEGATIVE  LARGE (A)  WBC, UA Latest Range: <3 WBC/hpf 3-6  RBC / HPF Latest Range: <3 RBC/hpf TOO NUMEROUS TO COUNT  Squamous Epithelial / LPF Latest Range: RARE  RARE  Bacteria, UA Latest Range: RARE  RARE   CBC    Component Value Date/Time   WBC 7.1 07/08/2011 0425   RBC 3.68* 07/08/2011 0425   HGB 10.3* 07/08/2011 0425   HCT 31.9* 07/08/2011 0425   PLT 164 07/08/2011 0425   MCV 86.7 07/08/2011 0425   MCH 28.0 07/08/2011 0425   MCHC 32.3 07/08/2011 0425   RDW 14.2 07/08/2011 0425   LYMPHSABS 1.7 07/06/2011 1055   MONOABS 0.6 07/06/2011 1055   EOSABS 0.2 07/06/2011 1055   BASOSABS 0.0 07/06/2011 1055   BMET    Component Value Date/Time   NA 142 07/09/2011 0445   K 4.2 07/09/2011 0445   CL 109 07/09/2011 0445   CO2 23 07/09/2011 0445   GLUCOSE 183* 07/09/2011 0445   BUN 30* 07/09/2011 0445   CREATININE 1.88* 07/09/2011 0445   CREATININE 2.07* 03/21/2011 1559   CALCIUM 8.7 07/09/2011 0445   GFRNONAA 26* 07/09/2011 0445   GFRAA 30* 07/09/2011 0445   Chest 2 view: 07/06/11 Findings: Upper limits of normal heart size again noted.  Mild peribronchial thickening is unchanged.  There is no evidence of focal airspace disease, pulmonary edema,  suspicious pulmonary nodule/mass, pleural effusion, or  pneumothorax.  No acute bony abnormalities are identified.  IMPRESSION:  No evidence of acute cardiopulmonary disease.  Mild chronic peribronchial thickening.  Discharge Exam: Filed Vitals:   07/08/11 1407 07/08/11 2125 07/09/11 0151 07/09/11 0608  BP: 139/72 162/76  160/81  Pulse: 66 81  93  Temp: 97.1 F (36.2 C) 98.2 F (36.8 C)  98.2 F (36.8 C)  TempSrc: Oral Oral  Oral  Resp: 20 20  20   Height:      Weight:   241 lb 1.6 oz (109.362 kg)   SpO2: 96% 98%  97%   General appearance: alert, cooperative and sitting in chair. Talkative and pleasant Resp: clear to auscultation bilaterally  Cardio:  Regular rate and rhythm, 1/6 flow murmur  GI: soft, non-tender; bowel sounds normal; no masses, no organomegaly  Extremities: edema in lower extremities, mildly more prominent in right than left. Tenderness to touch on right leg. 5/5 strength in lower and upper extremities bilaterally, sensation to light touch intact in lower extremities bilaterally  Disposition: Skilled Nursing Facility  Patient Instructions:  Medication List  As of 07/09/2011  2:23 PM   STOP taking these medications         acetaminophen 650 MG CR tablet      aspirin EC 81 MG tablet      DULoxetine 20 MG capsule      insulin aspart 100 UNIT/ML injection         TAKE these medications         ASCENSIA CONTOUR TEST VI      citalopram 20 MG tablet   Commonly known as: CELEXA   Take 1 tablet (20 mg total) by mouth daily.      diclofenac sodium 1 % Gel   Commonly known as: VOLTAREN   Apply 1 application topically 4 (four) times daily  as needed. As needed for pain      diltiazem 240 MG 24 hr capsule   Commonly known as: CARDIZEM CD   Take 1 capsule (240 mg total) by mouth daily.      DSS 100 MG Caps   Take 100 mg by mouth 2 (two) times daily.      enalapril 20 MG tablet   Commonly known as: VASOTEC   Take 1 tablet (20 mg total) by mouth daily.      esomeprazole 40 MG capsule   Commonly known as: NEXIUM   Take 40 mg by mouth daily before breakfast.      gabapentin 100 MG capsule   Commonly known as: NEURONTIN   Take 1 capsule (100 mg total) by mouth daily as needed (pain).      HYDROcodone-acetaminophen 5-500 MG per tablet   Commonly known as: VICODIN   Take 1 tablet by mouth every 6 (six) hours as needed. For pain      HYDROcodone-acetaminophen 5-325 MG per tablet   Commonly known as: NORCO   Take 1 tablet by mouth every 8 (eight) hours as needed.      insulin glargine 100 UNIT/ML injection   Commonly known as: LANTUS   Inject 30 Units into the skin at bedtime.      Insulin Pen Needle 29G X  12.7MM Misc   1 Container by Does not apply route daily.      Insulin Syringe-Needle U-100 31G X 15/64" 1 ML Misc   20 Units by Does not apply route every morning.      meclizine 12.5 MG tablet   Commonly known as: ANTIVERT   Take 12.5 mg by mouth 3 (three) times daily as needed. For nausea and dizziness      ondansetron 4 MG tablet   Commonly known as: ZOFRAN   Take 4 mg by mouth every 6 (six) hours as needed. For nausea      Rivaroxaban 15 MG Tabs tablet   Commonly known as: XARELTO   Take 1 tablet (15 mg total) by mouth daily with supper.      simvastatin 40 MG tablet   Commonly known as: ZOCOR   Take 40 mg by mouth at bedtime.           Activity: activity as tolerated Diet: diabetic diet Wound Care: none needed  Follow-up with Dr. Deirdre Priest on March 18th at 2:15pm.   Signed: Marena Chancy 07/09/2011 2:23 PM

## 2011-07-09 LAB — BASIC METABOLIC PANEL
Calcium: 8.7 mg/dL (ref 8.4–10.5)
GFR calc Af Amer: 30 mL/min — ABNORMAL LOW (ref >90)
GFR calc non Af Amer: 26 mL/min — ABNORMAL LOW (ref >90)
Glucose, Bld: 183 mg/dL — ABNORMAL HIGH (ref 70–99)
Sodium: 142 mEq/L (ref 135–145)

## 2011-07-09 LAB — GLUCOSE, CAPILLARY: Glucose-Capillary: 145 mg/dL — ABNORMAL HIGH (ref 70–99)

## 2011-07-09 MED ORDER — HYDROCODONE-ACETAMINOPHEN 5-325 MG PO TABS: 1.0000 | ORAL_TABLET | Freq: Three times a day (TID) | ORAL | Status: DC | PRN

## 2011-07-09 MED ORDER — DSS 100 MG PO CAPS: 100.0000 mg | ORAL_CAPSULE | Freq: Two times a day (BID) | ORAL | Status: AC

## 2011-07-09 MED ORDER — RIVAROXABAN 15 MG PO TABS: 15.0000 mg | ORAL_TABLET | Freq: Every day | ORAL | Status: DC

## 2011-07-09 MED ORDER — CITALOPRAM HYDROBROMIDE 20 MG PO TABS: 20.0000 mg | ORAL_TABLET | Freq: Every day | ORAL | Status: DC

## 2011-07-09 MED ORDER — ENALAPRIL MALEATE 20 MG PO TABS: 20.0000 mg | ORAL_TABLET | Freq: Every day | ORAL | Status: DC

## 2011-07-09 MED ORDER — DILTIAZEM HCL ER COATED BEADS 240 MG PO CP24: 240.0000 mg | ORAL_CAPSULE | Freq: Every day | ORAL | Status: DC

## 2011-07-09 MED ORDER — DILTIAZEM HCL ER COATED BEADS 240 MG PO CP24
240.0000 mg | ORAL_CAPSULE | Freq: Every day | ORAL | Status: DC
  Administered 2011-07-09: 240 mg via ORAL
  Filled 2011-07-09: qty 1

## 2011-07-09 MED ORDER — INSULIN GLARGINE 100 UNIT/ML ~~LOC~~ SOLN: 30.0000 [IU] | Freq: Every day | SUBCUTANEOUS | Status: DC

## 2011-07-09 MED ORDER — GABAPENTIN 100 MG PO CAPS: 100.0000 mg | ORAL_CAPSULE | Freq: Every day | ORAL | Status: DC | PRN

## 2011-07-09 NOTE — Progress Notes (Signed)
Discharge instructions and patient education complete. IV site d/c. Site WNL. No s/s of distress. Discharge summary in packet to be sent with pt to rehab. EMS discharged pt to rehab. Tina [name]

## 2011-07-09 NOTE — Progress Notes (Signed)
Clinical Child psychotherapist (CSW) informed from [facility] that they have received authorization from pt insurance. CSW has informed pt and RN and CSW will contact [facility] for transport. Pt dc packet has been placed in pt Tina [name]. CSW is signing off.  Theresia Bough, MSW, Theresia Majors [phone]

## 2011-07-09 NOTE — Progress Notes (Signed)
OT Cancellation Note  Treatment cancelled today due to patient's polite refusal to participate: pt had just gotten to chair after getting up with RN. Pt request therapy return later. Of note, pt is now agreeable to SNF for d/c plan, will update frequency as appropriate. Will check back as schedule allows. Thanks!  Tina [name], OTR/L Pager: [phone] 07/09/2011     Tina [name] 07/09/2011, 12:00 PM

## 2011-07-09 NOTE — Progress Notes (Signed)
Clinical Child psychotherapist (CSW) just informed by [facility] that authorization from insurance is still pending. CSW was not made aware of this until a few minutes ago. CSW has informed pt and MD of delay. CSW will await an update from facility and facilitate with dc today pending authorization.  Theresia Bough, MSW, Theresia Majors [phone]

## 2011-07-10 NOTE — Discharge Summary (Signed)
I discussed with Dr Gwenlyn Saran.  I agree with their plans documented in their Discharge Note.

## 2011-07-22 ENCOUNTER — Telehealth: Payer: Self-pay | Admitting: *Deleted

## 2011-07-22 ENCOUNTER — Telehealth (HOSPITAL_COMMUNITY): Payer: Self-pay | Admitting: Physical Therapy

## 2011-07-22 NOTE — Telephone Encounter (Signed)
Nyoka Lint RN notified of message from MD.

## 2011-07-22 NOTE — Telephone Encounter (Signed)
Received a call from Nyoka Lint RN with [facility].  Patient was discharged from [facility] yesterday.  Nurse wanted [place] to know that she will be receiving HH PT, OT, Nursing and [facility] for 2 weeks.  Her VS are stable and the only complaint patient has is 1+ pitting edema in her right leg.  Does have a h/o right leg swelling.  She advised patient to keep leg elevated and asked that we call her if we wanted to adjust meds.

## 2011-07-22 NOTE — Telephone Encounter (Addendum)
Received call from Nyoka Lint, nurse with [facility]  reporting a potential problem with medication interaction  of cardizem and citalpram which is a level 1. She is required to notify MD.  Also level 2 interaction is Voltaren cream and citalopram.  Also patient has been started on a blood thinner, Rivaroxaba but will not be available until tomorrow  Call back # [phone]

## 2011-07-22 NOTE — Telephone Encounter (Signed)
The benefit - risk of continuing diltiazem, voltaren gel, rivaroxaban, and citalopram 20 mg daily is such that patient should continue this regiment.

## 2011-07-25 ENCOUNTER — Other Ambulatory Visit: Payer: Self-pay | Admitting: Family Medicine

## 2011-07-25 MED ORDER — INSULIN ASPART 100 UNIT/ML ~~LOC~~ SOLN: SUBCUTANEOUS | Status: DC

## 2011-07-25 NOTE — Telephone Encounter (Signed)
Patient is calling for a refill on one of her Diabetes medications, not Lantus, she said it starts with a "K", but she didn't know the name.  She said her pharmacy has been trying to get in touch with Dr. Deirdre Priest.  She is now out of this medication.

## 2011-07-25 NOTE — Telephone Encounter (Signed)
Called pharmacy and was told patient is wanting a prescription for Novolog. States she had been getting samples from [place].  Novolog not on med list. Called Dr. Raymondo Band and  he advises may send in RX for Novolog 6 units before meals.  It is noted in his note from 06/30/2010 that this is the regime patient is on.    I called patient and she states she does check her blood sugars three times daily . Was 227 fasting this AM and 315 before lunch today.  Dr. Earnest Bailey will send  in the refill now to [facility] [address]

## 2011-07-28 ENCOUNTER — Encounter: Payer: Self-pay | Admitting: Family Medicine

## 2011-07-28 ENCOUNTER — Ambulatory Visit (INDEPENDENT_AMBULATORY_CARE_PROVIDER_SITE_OTHER): Payer: Medicare HMO | Admitting: Family Medicine

## 2011-07-28 VITALS — BP 140/79 | HR 79 | Temp 98.3°F | Ht 65.0 in | Wt 248.0 lb

## 2011-07-28 DIAGNOSIS — I4891 Unspecified atrial fibrillation: Secondary | ICD-10-CM

## 2011-07-28 DIAGNOSIS — R319 Hematuria, unspecified: Secondary | ICD-10-CM

## 2011-07-28 DIAGNOSIS — E118 Type 2 diabetes mellitus with unspecified complications: Secondary | ICD-10-CM

## 2011-07-28 DIAGNOSIS — I10 Essential (primary) hypertension: Secondary | ICD-10-CM

## 2011-07-28 LAB — POCT URINALYSIS DIPSTICK
Blood, UA: NEGATIVE
Glucose, UA: NEGATIVE
Spec Grav, UA: >=1.03

## 2011-07-28 LAB — POCT UA - MICROSCOPIC ONLY: WBC, Ur, HPF, POC: >20

## 2011-07-28 NOTE — Assessment & Plan Note (Signed)
Well controlled on diltiazem and on rivaroxaban for CVA prophylaxis.

## 2011-07-28 NOTE — Assessment & Plan Note (Signed)
Not well controlled.  Will increase lantus and follow closely

## 2011-07-28 NOTE — Assessment & Plan Note (Signed)
Controlled today.  Will monitor need to keep well controlled with history of stroke

## 2011-07-28 NOTE — Progress Notes (Signed)
  Subjective:    Patient ID: Tina [name], female    DOB: December 17, 1940, 71 y.o.   MRN: [id number]  HPI DIABETES Disease Monitoring: Blood Sugar ranges-around 200 the last week but last day have been < 150  Polyuria/phagia/dipsia- no      Visual problems- no  Medications: Compliance- taking lantus 22 u and novolog 6 three times daily  Hypoglycemic symptoms- no  Afib Brief episodes of heart racing but resolve on own.  No lightheadness or chest pain or shortness of breath.  Taking Xarelto daily (her son puts out her medications in pill box).  HYPERTENSION Disease Monitoring Home BP Monitoring thinks is usually controlled - checked by home health Chest pain- no     Dyspnea-  no  Medications Compliance: taking as prescribed. Lightheadedness-  no  Edema-  no   ROS - See HPI  PMH Lab Review   Potassium  Date Value Range Status  07/09/2011 4.2  3.5-5.1 (mEq/L) Final     Sodium  Date Value Range Status  07/09/2011 142  135-145 (mEq/L) Final      Hematuria Noticed in hospital.  She has not seen any.  No dysuria or back pain  Review of Symptoms - see HPI  PMH - Smoking status noted.       Review of Systems     Objective:   Physical Exam  Heart - Regular rate and rhythm.  Gr 2/6 systolic murmurs,  Lungs:  Normal respiratory effort, chest expands symmetrically. Lungs are clear to auscultation, no crackles or wheezes. Extremities:  No cyanosis,2+ edema no skin breakdown       Assessment & Plan:

## 2011-07-28 NOTE — Patient Instructions (Signed)
I will call you if your lab tests are not normal.  Otherwise we will discuss them at your next visit.  Increase you Lantus to 24 u each AM and use the Nexium as needed  Call if any problems

## 2011-07-28 NOTE — Assessment & Plan Note (Signed)
While in hospital.  None on UA today.  Her pyuria is likely related to a poor clean catch on perhaps chronic bacturia.   No treatment indicated

## 2011-07-29 LAB — BASIC METABOLIC PANEL
CO2: 24 mEq/L (ref 19–32)
Chloride: 105 mEq/L (ref 96–112)
Creat: 1.87 mg/dL — ABNORMAL HIGH (ref 0.50–1.10)
Potassium: 4.4 mEq/L (ref 3.5–5.3)

## 2011-08-07 ENCOUNTER — Telehealth: Payer: Self-pay | Admitting: Family Medicine

## 2011-08-07 NOTE — Telephone Encounter (Signed)
Patient wants an order for a Tina [name] with a seat to go to [place] or [place].  It does need to be authorized with [facility], that # is [id number].

## 2011-08-14 ENCOUNTER — Other Ambulatory Visit: Payer: Self-pay | Admitting: Family Medicine

## 2011-08-14 NOTE — Telephone Encounter (Signed)
[id number]  

## 2011-08-18 ENCOUNTER — Telehealth: Payer: Self-pay | Admitting: Family Medicine

## 2011-08-18 ENCOUNTER — Other Ambulatory Visit: Payer: Self-pay | Admitting: Family Medicine

## 2011-08-18 NOTE — Telephone Encounter (Signed)
Patient is calling for the results on her labwork.  She also wants to know if she can drive now.

## 2011-08-19 NOTE — Telephone Encounter (Signed)
I cant say she can drive until I see her in the office Thanks

## 2011-08-19 NOTE — Telephone Encounter (Signed)
Please let her know her blood tests were all as expected.  Her kidneys are a little better  She should bring in paper work for a walker at her next visit.  I need a recommendation for it from her physic therapist  Thanks  LC

## 2011-08-19 NOTE — Telephone Encounter (Signed)
Pt informed of the below, agreeable to getting paperwork and bringing with her.    Pt asked again if she is able to drive, advised that I would send message back to MD Honora Searson, Maryjo Rochester

## 2011-08-19 NOTE — Telephone Encounter (Signed)
Pt informed. Tina [name]  

## 2011-08-21 ENCOUNTER — Other Ambulatory Visit: Payer: Self-pay | Admitting: Family Medicine

## 2011-08-21 MED ORDER — HYDROCODONE-ACETAMINOPHEN 5-325 MG PO TABS: 1.0000 | ORAL_TABLET | Freq: Three times a day (TID) | ORAL | Status: DC | PRN

## 2011-09-01 ENCOUNTER — Ambulatory Visit (INDEPENDENT_AMBULATORY_CARE_PROVIDER_SITE_OTHER): Payer: Medicare HMO | Admitting: Family Medicine

## 2011-09-01 ENCOUNTER — Encounter: Payer: Self-pay | Admitting: Family Medicine

## 2011-09-01 DIAGNOSIS — I4891 Unspecified atrial fibrillation: Secondary | ICD-10-CM

## 2011-09-01 DIAGNOSIS — E118 Type 2 diabetes mellitus with unspecified complications: Secondary | ICD-10-CM

## 2011-09-01 DIAGNOSIS — E119 Type 2 diabetes mellitus without complications: Secondary | ICD-10-CM

## 2011-09-01 MED ORDER — FUROSEMIDE 20 MG PO TABS: 10.0000 mg | ORAL_TABLET | Freq: Every day | ORAL | Status: DC

## 2011-09-01 NOTE — Assessment & Plan Note (Signed)
Rate controlled and anticoagulated continue current medications

## 2011-09-01 NOTE — Patient Instructions (Addendum)
See Rosalita Chessman back in 1 month  Will start a new medication to help with the swelling - 1/2 tab once a day  Call if any chest pain or severe shortness of breath   Take the Novolog (orange insulin) 6 units - 8 units and 6 units. Continue the Lantus at 24 units

## 2011-09-01 NOTE — Assessment & Plan Note (Signed)
Stable creatinine.  Warned may need to see nephrologist.  Low dose lasix for diuresis

## 2011-09-01 NOTE — Progress Notes (Signed)
  Subjective:    Patient ID: Tina [name], female    DOB: [birth year], 71 y.o.   MRN: [id number]  HPI  DIABETES Disease Monitoring: Blood Sugar ranges-checking three times daily.  No lows and evenings are in low 200s rest are in 100s Polyuria/phagia/dipsia- no      Visual problems- no  Medications: Compliance- brings in all her medications  Hypoglycemic symptoms- no  AFIb No chest pain or lightheadness or palpitations. Taking xarelto regularly. No bleeding  Renal disease Having swelling in both legs R>L since off her diuretic.  Does go down some with elevation.   Some shortness of breath with exertion but no orthopnea   Review of Symptoms - see HPI  PMH - Smoking status noted.       Review of Systems     Objective:   Physical Exam  No acute distress Lungs:  Normal respiratory effort, chest expands symmetrically. Lungs are clear to auscultation, no crackles or wheezes. Heart - Regular rate and rhythm.  Gr1-2/6 systolic murmur, gallops or rubs.    Legs - 2+ edema pitting bilaterally no tenderness or cords        Assessment & Plan:

## 2011-09-01 NOTE — Assessment & Plan Note (Signed)
Her blood sugar indicate fairly good control.  Will increase mid day novolog.  She is checking regularly

## 2011-09-11 ENCOUNTER — Other Ambulatory Visit: Payer: Self-pay | Admitting: Family Medicine

## 2011-09-18 ENCOUNTER — Other Ambulatory Visit: Payer: Self-pay | Admitting: Family Medicine

## 2011-09-18 NOTE — Progress Notes (Signed)
Schedule fu appointment on 10/09/11.

## 2011-09-19 NOTE — Telephone Encounter (Signed)
Rx called in as written below. Patient notified.

## 2011-09-19 NOTE — Telephone Encounter (Signed)
Please call in the Rx  Thanks  LC 

## 2011-09-22 ENCOUNTER — Other Ambulatory Visit: Payer: Self-pay | Admitting: Family Medicine

## 2011-09-23 ENCOUNTER — Other Ambulatory Visit: Payer: Self-pay | Admitting: Family Medicine

## 2011-09-23 MED ORDER — INSULIN ASPART 100 UNIT/ML ~~LOC~~ SOLN: SUBCUTANEOUS | Status: DC

## 2011-09-26 ENCOUNTER — Other Ambulatory Visit: Payer: Self-pay | Admitting: Family Medicine

## 2011-09-29 ENCOUNTER — Other Ambulatory Visit: Payer: Self-pay | Admitting: Family Medicine

## 2011-10-01 ENCOUNTER — Other Ambulatory Visit: Payer: Self-pay | Admitting: Family Medicine

## 2011-10-03 ENCOUNTER — Other Ambulatory Visit: Payer: Self-pay | Admitting: *Deleted

## 2011-10-03 MED ORDER — GLUCOSE BLOOD VI STRP: ORAL_STRIP | Status: DC

## 2011-10-03 MED ORDER — DICLOFENAC SODIUM 1 % TD GEL: 1.0000 "application " | Freq: Four times a day (QID) | TRANSDERMAL | Status: DC | PRN

## 2011-10-09 ENCOUNTER — Ambulatory Visit: Payer: Medicare HMO | Admitting: Home Health Services

## 2011-10-13 ENCOUNTER — Other Ambulatory Visit: Payer: Self-pay | Admitting: Family Medicine

## 2011-10-27 ENCOUNTER — Ambulatory Visit: Payer: Medicare HMO | Admitting: Home Health Services

## 2011-10-31 ENCOUNTER — Ambulatory Visit: Payer: Medicare HMO | Admitting: Family Medicine

## 2011-11-07 ENCOUNTER — Ambulatory Visit: Payer: Medicare HMO | Admitting: Family Medicine

## 2011-12-03 ENCOUNTER — Other Ambulatory Visit: Payer: Self-pay | Admitting: Family Medicine

## 2011-12-03 NOTE — Telephone Encounter (Signed)
Please phone in Note on Rx - Not to take more than 30 in one month  Thanks  LC

## 2011-12-03 NOTE — Telephone Encounter (Signed)
RX called in verbally to pharmacy. Advised   For patient  To take  no more than #30 in month.

## 2011-12-18 ENCOUNTER — Ambulatory Visit: Payer: Self-pay | Admitting: Emergency Medicine

## 2011-12-18 LAB — HEPATIC FUNCTION PANEL A (ARMC)
Albumin: 3.3 g/dL — ABNORMAL LOW (ref 3.4–5.0)
SGOT(AST): 20 U/L (ref 15–37)
SGPT (ALT): 16 U/L (ref 12–78)
Total Protein: 6.5 g/dL (ref 6.4–8.2)

## 2011-12-18 LAB — CBC WITH DIFFERENTIAL/PLATELET
Basophil #: 0 10*3/uL (ref 0.0–0.1)
Basophil %: 0.3 %
Eosinophil %: 3.1 %
HCT: 34 % — ABNORMAL LOW (ref 35.0–47.0)
Lymphocyte %: 16.9 %
MCH: 28.2 pg (ref 26.0–34.0)
MCV: 83 fL (ref 80–100)
Monocyte %: 6.6 %
Neutrophil #: 6 10*3/uL (ref 1.4–6.5)
Platelet: 169 10*3/uL (ref 150–440)
RDW: 14.6 % — ABNORMAL HIGH (ref 11.5–14.5)

## 2011-12-18 LAB — BASIC METABOLIC PANEL
Anion Gap: 9 (ref 7–16)
Chloride: 103 mmol/L (ref 98–107)
Creatinine: 2.12 mg/dL — ABNORMAL HIGH (ref 0.60–1.30)
EGFR (African American): 27 — ABNORMAL LOW
EGFR (Non-African Amer.): 23 — ABNORMAL LOW
Glucose: 365 mg/dL — ABNORMAL HIGH (ref 65–99)
Osmolality: 304 (ref 275–301)
Sodium: 140 mmol/L (ref 136–145)

## 2012-01-05 ENCOUNTER — Other Ambulatory Visit: Payer: Self-pay | Admitting: Family Medicine

## 2012-01-09 LAB — COMPREHENSIVE METABOLIC PANEL
Anion Gap: 10 (ref 7–16)
BUN: 47 mg/dL — ABNORMAL HIGH (ref 7–18)
Bilirubin,Total: 0.7 mg/dL (ref 0.2–1.0)
Chloride: 99 mmol/L (ref 98–107)
Co2: 25 mmol/L (ref 21–32)
EGFR (African American): 15 — ABNORMAL LOW
EGFR (Non-African Amer.): 13 — ABNORMAL LOW
Glucose: 458 mg/dL — ABNORMAL HIGH (ref 65–99)
Osmolality: 300 (ref 275–301)
Potassium: 4 mmol/L (ref 3.5–5.1)
Sodium: 134 mmol/L — ABNORMAL LOW (ref 136–145)

## 2012-01-09 LAB — URINALYSIS, COMPLETE
Bilirubin,UR: NEGATIVE
Leukocyte Esterase: NEGATIVE
Nitrite: NEGATIVE
Ph: 5 (ref 4.5–8.0)
Squamous Epithelial: <1

## 2012-01-09 LAB — CBC
HCT: 36.7 % (ref 35.0–47.0)
HGB: 12.2 g/dL (ref 12.0–16.0)
MCHC: 33.1 g/dL (ref 32.0–36.0)
MCV: 85 fL (ref 80–100)

## 2012-01-09 LAB — PROTIME-INR: Prothrombin Time: 16.4 secs — ABNORMAL HIGH (ref 11.5–14.7)

## 2012-01-10 ENCOUNTER — Inpatient Hospital Stay: Payer: Self-pay | Admitting: Internal Medicine

## 2012-01-10 LAB — BASIC METABOLIC PANEL
Calcium, Total: 7.5 mg/dL — ABNORMAL LOW (ref 8.5–10.1)
Chloride: 106 mmol/L (ref 98–107)
Co2: 26 mmol/L (ref 21–32)
Creatinine: 3.37 mg/dL — ABNORMAL HIGH (ref 0.60–1.30)
EGFR (African American): 15 — ABNORMAL LOW
Potassium: 3.2 mmol/L — ABNORMAL LOW (ref 3.5–5.1)
Sodium: 141 mmol/L (ref 136–145)

## 2012-01-10 LAB — CLOSTRIDIUM DIFFICILE BY PCR

## 2012-01-10 LAB — HEMOGLOBIN: HGB: 10.8 g/dL — ABNORMAL LOW (ref 12.0–16.0)

## 2012-01-11 LAB — BASIC METABOLIC PANEL
Anion Gap: 9 (ref 7–16)
BUN: 35 mg/dL — ABNORMAL HIGH (ref 7–18)
Calcium, Total: 7.6 mg/dL — ABNORMAL LOW (ref 8.5–10.1)
EGFR (African American): 20 — ABNORMAL LOW
EGFR (Non-African Amer.): 17 — ABNORMAL LOW
Glucose: 119 mg/dL — ABNORMAL HIGH (ref 65–99)
Osmolality: 298 (ref 275–301)
Potassium: 3.2 mmol/L — ABNORMAL LOW (ref 3.5–5.1)

## 2012-01-11 LAB — CBC WITH DIFFERENTIAL/PLATELET
Basophil #: 0 10*3/uL (ref 0.0–0.1)
Basophil %: 0.7 %
Eosinophil #: 0.2 10*3/uL (ref 0.0–0.7)
Eosinophil #: 0.2 10*3/uL (ref 0.0–0.7)
Eosinophil %: 2.4 %
HCT: 29 % — ABNORMAL LOW (ref 35.0–47.0)
HCT: 29.6 % — ABNORMAL LOW (ref 35.0–47.0)
HGB: 9.8 g/dL — ABNORMAL LOW (ref 12.0–16.0)
HGB: 9.6 g/dL — ABNORMAL LOW (ref 12.0–16.0)
Lymphocyte %: 20.4 %
MCH: 28.1 pg (ref 26.0–34.0)
MCHC: 34 g/dL (ref 32.0–36.0)
MCHC: 32.5 g/dL (ref 32.0–36.0)
Monocyte #: 0.5 x10 3/mm (ref 0.2–0.9)
Monocyte %: 7.1 %
Neutrophil #: 4.8 10*3/uL (ref 1.4–6.5)
Neutrophil %: 69.8 %
Platelet: 135 10*3/uL — ABNORMAL LOW (ref 150–440)
RBC: 3.51 10*6/uL — ABNORMAL LOW (ref 3.80–5.20)
WBC: 7.9 10*3/uL (ref 3.6–11.0)
WBC: 6.9 10*3/uL (ref 3.6–11.0)

## 2012-01-11 LAB — HEMOGLOBIN: HGB: 9.8 g/dL — ABNORMAL LOW (ref 12.0–16.0)

## 2012-01-12 ENCOUNTER — Emergency Department: Payer: Self-pay | Admitting: Emergency Medicine

## 2012-01-12 LAB — BASIC METABOLIC PANEL
Anion Gap: 8 (ref 7–16)
BUN: 21 mg/dL — ABNORMAL HIGH (ref 7–18)
Calcium, Total: 7.9 mg/dL — ABNORMAL LOW (ref 8.5–10.1)
Chloride: 106 mmol/L (ref 98–107)
Co2: 24 mmol/L (ref 21–32)
Creatinine: 2.1 mg/dL — ABNORMAL HIGH (ref 0.60–1.30)
Creatinine: 2.24 mg/dL — ABNORMAL HIGH (ref 0.60–1.30)
EGFR (African American): 27 — ABNORMAL LOW
EGFR (African American): 25 — ABNORMAL LOW
EGFR (Non-African Amer.): 23 — ABNORMAL LOW
EGFR (Non-African Amer.): 22 — ABNORMAL LOW
Glucose: 165 mg/dL — ABNORMAL HIGH (ref 65–99)
Glucose: 345 mg/dL — ABNORMAL HIGH (ref 65–99)
Potassium: 3.5 mmol/L (ref 3.5–5.1)
Potassium: 3.8 mmol/L (ref 3.5–5.1)
Sodium: 143 mmol/L (ref 136–145)
Sodium: 138 mmol/L (ref 136–145)

## 2012-01-12 LAB — CBC WITH DIFFERENTIAL/PLATELET
Basophil #: 0 10*3/uL (ref 0.0–0.1)
Eosinophil #: 0.2 10*3/uL (ref 0.0–0.7)
Eosinophil %: 3.2 %
HCT: 30 % — ABNORMAL LOW (ref 35.0–47.0)
HGB: 10.1 g/dL — ABNORMAL LOW (ref 12.0–16.0)
Lymphocyte #: 1.1 10*3/uL (ref 1.0–3.6)
Lymphocyte %: 16.2 %
MCV: 84 fL (ref 80–100)
Monocyte %: 7.1 %
Neutrophil #: 5.2 10*3/uL (ref 1.4–6.5)
RBC: 3.59 10*6/uL — ABNORMAL LOW (ref 3.80–5.20)
WBC: 7.1 10*3/uL (ref 3.6–11.0)

## 2012-01-12 LAB — CBC
MCV: 85 fL (ref 80–100)
Platelet: 149 10*3/uL — ABNORMAL LOW (ref 150–440)
RBC: 3.88 10*6/uL (ref 3.80–5.20)
RDW: 14.4 % (ref 11.5–14.5)
WBC: 9.1 10*3/uL (ref 3.6–11.0)

## 2012-01-15 LAB — STOOL CULTURE

## 2012-01-16 LAB — PATHOLOGY REPORT

## 2012-01-31 ENCOUNTER — Encounter (HOSPITAL_COMMUNITY): Payer: Self-pay | Admitting: *Deleted

## 2012-01-31 ENCOUNTER — Inpatient Hospital Stay (HOSPITAL_COMMUNITY)
Admission: EM | Admit: 2012-01-31 | Discharge: 2012-02-06 | DRG: 683 | Disposition: A | Discharge status: Skilled Nursing Facility | Payer: Medicare HMO | Attending: Family Medicine | Admitting: Family Medicine

## 2012-01-31 DIAGNOSIS — R739 Hyperglycemia, unspecified: Secondary | ICD-10-CM

## 2012-01-31 DIAGNOSIS — R4182 Altered mental status, unspecified: Secondary | ICD-10-CM | POA: Diagnosis present

## 2012-01-31 DIAGNOSIS — K219 Gastro-esophageal reflux disease without esophagitis: Secondary | ICD-10-CM | POA: Diagnosis present

## 2012-01-31 DIAGNOSIS — D649 Anemia, unspecified: Secondary | ICD-10-CM | POA: Diagnosis present

## 2012-01-31 DIAGNOSIS — F0390 Unspecified dementia without behavioral disturbance: Secondary | ICD-10-CM | POA: Diagnosis present

## 2012-01-31 DIAGNOSIS — F3289 Other specified depressive episodes: Secondary | ICD-10-CM | POA: Diagnosis present

## 2012-01-31 DIAGNOSIS — E872 Acidosis, unspecified: Secondary | ICD-10-CM

## 2012-01-31 DIAGNOSIS — N179 Acute kidney failure, unspecified: Secondary | ICD-10-CM | POA: Diagnosis present

## 2012-01-31 DIAGNOSIS — N183 Chronic kidney disease, stage 3 unspecified: Secondary | ICD-10-CM | POA: Diagnosis present

## 2012-01-31 DIAGNOSIS — L89609 Pressure ulcer of unspecified heel, unspecified stage: Secondary | ICD-10-CM

## 2012-01-31 DIAGNOSIS — Z794 Long term (current) use of insulin: Secondary | ICD-10-CM

## 2012-01-31 DIAGNOSIS — E669 Obesity, unspecified: Secondary | ICD-10-CM | POA: Diagnosis present

## 2012-01-31 DIAGNOSIS — E1149 Type 2 diabetes mellitus with other diabetic neurological complication: Secondary | ICD-10-CM | POA: Diagnosis present

## 2012-01-31 DIAGNOSIS — Z6841 Body Mass Index (BMI) 40.0 and over, adult: Secondary | ICD-10-CM

## 2012-01-31 DIAGNOSIS — G4700 Insomnia, unspecified: Secondary | ICD-10-CM | POA: Diagnosis present

## 2012-01-31 DIAGNOSIS — E785 Hyperlipidemia, unspecified: Secondary | ICD-10-CM | POA: Diagnosis present

## 2012-01-31 DIAGNOSIS — E118 Type 2 diabetes mellitus with unspecified complications: Secondary | ICD-10-CM | POA: Diagnosis present

## 2012-01-31 DIAGNOSIS — E860 Dehydration: Secondary | ICD-10-CM

## 2012-01-31 DIAGNOSIS — M1990 Unspecified osteoarthritis, unspecified site: Secondary | ICD-10-CM | POA: Diagnosis present

## 2012-01-31 DIAGNOSIS — N170 Acute kidney failure with tubular necrosis: Principal | ICD-10-CM | POA: Diagnosis present

## 2012-01-31 DIAGNOSIS — Z8673 Personal history of transient ischemic attack (TIA), and cerebral infarction without residual deficits: Secondary | ICD-10-CM

## 2012-01-31 DIAGNOSIS — F329 Major depressive disorder, single episode, unspecified: Secondary | ICD-10-CM | POA: Diagnosis present

## 2012-01-31 DIAGNOSIS — E1142 Type 2 diabetes mellitus with diabetic polyneuropathy: Secondary | ICD-10-CM | POA: Diagnosis present

## 2012-01-31 DIAGNOSIS — I4891 Unspecified atrial fibrillation: Secondary | ICD-10-CM | POA: Diagnosis present

## 2012-01-31 DIAGNOSIS — Z85828 Personal history of other malignant neoplasm of skin: Secondary | ICD-10-CM

## 2012-01-31 DIAGNOSIS — Z79899 Other long term (current) drug therapy: Secondary | ICD-10-CM

## 2012-01-31 DIAGNOSIS — L8995 Pressure ulcer of unspecified site, unstageable: Secondary | ICD-10-CM | POA: Diagnosis present

## 2012-01-31 DIAGNOSIS — D72829 Elevated white blood cell count, unspecified: Secondary | ICD-10-CM | POA: Diagnosis present

## 2012-01-31 DIAGNOSIS — K5900 Constipation, unspecified: Secondary | ICD-10-CM | POA: Diagnosis present

## 2012-01-31 DIAGNOSIS — I10 Essential (primary) hypertension: Secondary | ICD-10-CM | POA: Diagnosis present

## 2012-01-31 DIAGNOSIS — R197 Diarrhea, unspecified: Secondary | ICD-10-CM | POA: Diagnosis present

## 2012-01-31 DIAGNOSIS — I5189 Other ill-defined heart diseases: Secondary | ICD-10-CM | POA: Diagnosis present

## 2012-01-31 DIAGNOSIS — I129 Hypertensive chronic kidney disease with stage 1 through stage 4 chronic kidney disease, or unspecified chronic kidney disease: Secondary | ICD-10-CM | POA: Diagnosis present

## 2012-01-31 LAB — URINALYSIS, ROUTINE W REFLEX MICROSCOPIC
Ketones, ur: NEGATIVE mg/dL
Nitrite: NEGATIVE
Protein, ur: NEGATIVE mg/dL
pH: 5 (ref 5.0–8.0)

## 2012-01-31 LAB — BASIC METABOLIC PANEL
Calcium: 7.8 mg/dL — ABNORMAL LOW (ref 8.4–10.5)
Creatinine, Ser: 5.54 mg/dL — ABNORMAL HIGH (ref 0.50–1.10)
GFR calc Af Amer: 8 mL/min — ABNORMAL LOW (ref >90)
GFR calc non Af Amer: 7 mL/min — ABNORMAL LOW (ref >90)
Sodium: 131 mEq/L — ABNORMAL LOW (ref 135–145)

## 2012-01-31 LAB — CBC WITH DIFFERENTIAL/PLATELET
Basophils Absolute: 0 10*3/uL (ref 0.0–0.1)
Eosinophils Relative: 1 % (ref 0–5)
Lymphocytes Relative: 9 % — ABNORMAL LOW (ref 12–46)
MCV: 80.2 fL (ref 78.0–100.0)
Neutro Abs: 8.9 10*3/uL — ABNORMAL HIGH (ref 1.7–7.7)
Neutrophils Relative %: 82 % — ABNORMAL HIGH (ref 43–77)
Platelets: 153 10*3/uL (ref 150–400)
RBC: 3.88 MIL/uL (ref 3.87–5.11)
RDW: 14.1 % (ref 11.5–15.5)
WBC: 10.9 10*3/uL — ABNORMAL HIGH (ref 4.0–10.5)

## 2012-01-31 LAB — GLUCOSE, CAPILLARY
Glucose-Capillary: 506 mg/dL — ABNORMAL HIGH (ref 70–99)
Glucose-Capillary: 475 mg/dL — ABNORMAL HIGH (ref 70–99)
Glucose-Capillary: 375 mg/dL — ABNORMAL HIGH (ref 70–99)
Glucose-Capillary: 354 mg/dL — ABNORMAL HIGH (ref 70–99)
Glucose-Capillary: 263 mg/dL — ABNORMAL HIGH (ref 70–99)

## 2012-01-31 MED ORDER — DILTIAZEM HCL ER BEADS 240 MG PO CP24
240.0000 mg | ORAL_CAPSULE | Freq: Every day | ORAL | Status: DC
  Filled 2012-01-31 (×2): qty 1

## 2012-01-31 MED ORDER — CIPROFLOXACIN IN D5W 200 MG/100ML IV SOLN
200.0000 mg | INTRAVENOUS | Status: DC
  Administered 2012-02-01 – 2012-02-02 (×2): 200 mg via INTRAVENOUS
  Filled 2012-01-31 (×3): qty 100

## 2012-01-31 MED ORDER — OXYCODONE-ACETAMINOPHEN 5-325 MG PO TABS
2.0000 | ORAL_TABLET | Freq: Once | ORAL | Status: AC
  Administered 2012-01-31: 2 via ORAL
  Filled 2012-01-31: qty 2

## 2012-01-31 MED ORDER — HYDROCODONE-ACETAMINOPHEN 5-325 MG PO TABS
1.0000 | ORAL_TABLET | ORAL | Status: DC | PRN
  Administered 2012-02-01: 1 via ORAL
  Administered 2012-02-02 (×2): 2 via ORAL
  Administered 2012-02-02: 1 via ORAL
  Administered 2012-02-02 – 2012-02-03 (×4): 2 via ORAL
  Filled 2012-01-31 (×6): qty 2
  Filled 2012-01-31 (×2): qty 1

## 2012-01-31 MED ORDER — PREGABALIN 25 MG PO CAPS
75.0000 mg | ORAL_CAPSULE | Freq: Two times a day (BID) | ORAL | Status: DC
  Administered 2012-02-01: 75 mg via ORAL
  Filled 2012-01-31: qty 2
  Filled 2012-01-31: qty 1

## 2012-01-31 MED ORDER — SODIUM CHLORIDE 0.9 % IV SOLN: INTRAVENOUS | Status: DC

## 2012-01-31 MED ORDER — SODIUM CHLORIDE 0.9 % IV SOLN
INTRAVENOUS | Status: DC
  Administered 2012-01-31: 18:00:00 via INTRAVENOUS

## 2012-01-31 MED ORDER — INSULIN REGULAR BOLUS VIA INFUSION
0.0000 [IU] | Freq: Three times a day (TID) | INTRAVENOUS | Status: DC
  Filled 2012-01-31: qty 10

## 2012-01-31 MED ORDER — SODIUM CHLORIDE 0.9 % IV SOLN
INTRAVENOUS | Status: DC
  Administered 2012-01-31: 4.2 [IU]/h via INTRAVENOUS
  Filled 2012-01-31: qty 1

## 2012-01-31 MED ORDER — ONDANSETRON HCL 4 MG PO TABS
4.0000 mg | ORAL_TABLET | Freq: Four times a day (QID) | ORAL | Status: DC | PRN
  Filled 2012-01-31: qty 0.5

## 2012-01-31 MED ORDER — CITALOPRAM HYDROBROMIDE 20 MG PO TABS
20.0000 mg | ORAL_TABLET | Freq: Every day | ORAL | Status: DC
  Administered 2012-02-01 – 2012-02-06 (×6): 20 mg via ORAL
  Filled 2012-01-31 (×7): qty 1

## 2012-01-31 MED ORDER — ACETAMINOPHEN 325 MG PO TABS
650.0000 mg | ORAL_TABLET | Freq: Four times a day (QID) | ORAL | Status: DC | PRN
  Administered 2012-02-01: 650 mg via ORAL
  Filled 2012-01-31: qty 2

## 2012-01-31 MED ORDER — DEXTROSE-NACL 5-0.45 % IV SOLN
INTRAVENOUS | Status: DC
  Administered 2012-01-31 – 2012-02-01 (×2): via INTRAVENOUS
  Administered 2012-02-01: 50 mL via INTRAVENOUS

## 2012-01-31 MED ORDER — MORPHINE SULFATE 4 MG/ML IJ SOLN: 4.0000 mg | Freq: Once | INTRAMUSCULAR | Status: DC

## 2012-01-31 MED ORDER — ONDANSETRON HCL 4 MG/2ML IJ SOLN: 4.0000 mg | Freq: Four times a day (QID) | INTRAMUSCULAR | Status: DC | PRN

## 2012-01-31 MED ORDER — METRONIDAZOLE IN NACL 5-0.79 MG/ML-% IV SOLN
500.0000 mg | Freq: Three times a day (TID) | INTRAVENOUS | Status: DC
  Administered 2012-02-01 – 2012-02-02 (×5): 500 mg via INTRAVENOUS
  Filled 2012-01-31 (×8): qty 100

## 2012-01-31 MED ORDER — DEXTROSE 50 % IV SOLN: 25.0000 mL | INTRAVENOUS | Status: DC | PRN

## 2012-01-31 MED ORDER — ACETAMINOPHEN 650 MG RE SUPP: 650.0000 mg | Freq: Four times a day (QID) | RECTAL | Status: DC | PRN

## 2012-01-31 MED ORDER — SODIUM CHLORIDE 0.9 % IJ SOLN
3.0000 mL | Freq: Two times a day (BID) | INTRAMUSCULAR | Status: DC
  Administered 2012-01-31 – 2012-02-06 (×11): 3 mL via INTRAVENOUS

## 2012-01-31 NOTE — ED Notes (Signed)
Per ems- pt reports having generalized weakness and high blood sugar. CBG with EMS 512. Pt reports weakness starting 2 days ago. BP 130/70. 97%. HR 100. Pt family wants a social work consult because they believe pt cannot take care of herself.

## 2012-01-31 NOTE — ED Provider Notes (Addendum)
History     CSN: [id number]  Arrival date & time 01/31/12  1622   First MD Initiated Contact with Patient 01/31/12 1724      Chief Complaint  Patient presents with  . Hyperglycemia    (Consider location/radiation/quality/duration/timing/severity/associated sxs/prior treatment) The history is provided by the patient and a relative.   71 year old, morbidly obese, female, with insulin-dependent diabetes, was brought to the emergency department by her family members for weakness, dehydration, and uncontrolled diarrhea.  She had been in the hospital approximately 2 weeks ago.  She was diagnosed with colitis, and diverticulitis.  She was treated with antibiotics and has completed taking them.  She denies pain anywhere.  She denies nausea, or vomiting.  She has not had a fever, chills, or cough.  Level V caveat applies for severe illness and urgent need for intervention  Past Medical History  Diagnosis Date  . Normal exercise sestamibi stress test 2007    [facility]  . Diabetes mellitus   . Hyperlipidemia   . Hypertension   . Heart murmur   . Chronic kidney disease     CKD stage 3  . Depression   . Stroke 06/2011    TIA     Past Surgical History  Procedure Date  . Bladder surgery 1994  . Knee surgery as child  . Abdominal hysterectomy     Family History  Problem Relation Age of Onset  . Coronary artery disease Mother   . Diabetes Mother   . Heart disease Mother   . Diabetes Sister   . Hypertension Sister   . Cancer Father     History  Substance Use Topics  . Smoking status: Never Smoker   . Smokeless tobacco: Never Used  . Alcohol Use: No    OB History    Grav Para Term Preterm Abortions TAB SAB Ect Mult Living                  Review of Systems  Constitutional: Negative for fever, chills and diaphoresis.  Eyes: Negative for visual disturbance.  Respiratory: Negative for cough and shortness of breath.   Cardiovascular: Negative for chest pain.  Gastrointestinal:  Positive for diarrhea. Negative for nausea, vomiting and abdominal pain.  Neurological: Negative for headaches.  Hematological: Bruises/bleeds easily.  Psychiatric/Behavioral: Negative for confusion.  All other systems reviewed and are negative.    Allergies  Review of patient's allergies indicates no known allergies.  Home Medications   Current Outpatient Rx  Name Route Sig Dispense Refill  . ALPRAZOLAM 0.5 MG PO TABS Oral Take 0.5 mg by mouth 3 (three) times daily as needed. For anxiety    . CITALOPRAM HYDROBROMIDE 20 MG PO TABS Oral Take 20 mg by mouth daily.    . CYCLOBENZAPRINE HCL 10 MG PO TABS Oral Take 10 mg by mouth 2 (two) times daily as needed. For muscle spasms    . DICLOFENAC SODIUM 1 % TD GEL Topical Apply 1 application topically 4 (four) times daily as needed. As needed for pain 100 g 0  . DILTIAZEM HCL ER BEADS 240 MG PO CP24 Oral Take 240 mg by mouth daily.    . ENALAPRIL MALEATE 20 MG PO TABS Oral Take 1 tablet (20 mg total) by mouth daily. 30 tablet 3  . INSULIN ASPART 100 UNIT/ML [place] SOLN Subcutaneous Inject 6-8 Units into the skin 3 (three) times daily before meals. Inject 6 units in AM, 8 units at lunch, and 6 units at dinner    .  INSULIN GLARGINE 100 UNIT/ML [place] SOLN Subcutaneous Inject 24 Units into the skin at bedtime. 10 mL   . MECLIZINE HCL 12.5 MG PO TABS Oral Take 12.5 mg by mouth 3 (three) times daily as needed. For dizziness    . PREGABALIN 75 MG PO CAPS Oral Take 75 mg by mouth 2 (two) times daily.    Marland Kitchen TEMAZEPAM 15 MG PO CAPS Oral Take 15 mg by mouth at bedtime as needed. For sleep    . TORSEMIDE 20 MG PO TABS Oral Take 20 mg by mouth daily.      BP 129/47  Pulse 98  Temp 97.6 F (36.4 C) (Oral)  Resp 14  SpO2 100%  Physical Exam  Nursing note and vitals reviewed. Constitutional: She is oriented to person, place, and time. No distress.       Morbidly obese  HENT:  Head: Normocephalic and atraumatic.  Eyes: Conjunctivae normal and EOM are  normal.  Neck: Normal range of motion. Neck supple.  Cardiovascular: Normal rate, normal heart sounds and intact distal pulses.   No murmur heard. Pulmonary/Chest: Effort normal and breath sounds normal. She has no rales.  Abdominal: Soft. She exhibits no distension. There is no tenderness.  Musculoskeletal: She exhibits edema. She exhibits no tenderness.  Neurological: She is alert and oriented to person, place, and time.  Psychiatric: She has a normal mood and affect. Thought content normal.    ED Course  Procedures (including critical care time) 71 year old, female, with insulin-dependent diabetes.  Presents emergency department with weakness, diarrhea, and decreased oral intake.  She lives alone and is unable to ambulate.  Now.  We will perform blood and urine tests.  We will establish an IV and treat for hyperglycemia and possible dehydration.    Labs Reviewed  GLUCOSE, CAPILLARY - Abnormal; Notable for the following:    Glucose-Capillary 506 (*)     All other components within normal limits  BASIC METABOLIC PANEL - Abnormal; Notable for the following:    Sodium 131 (*)     Chloride 92 (*)     CO2 17 (*)     Glucose, Bld 474 (*)     BUN 92 (*)     Creatinine, Ser 5.54 (*)     Calcium 7.8 (*)     GFR calc non Af Amer 7 (*)     GFR calc Af Amer 8 (*)     All other components within normal limits  CBC WITH DIFFERENTIAL - Abnormal; Notable for the following:    WBC 10.9 (*)     Hemoglobin 10.8 (*)     HCT 31.1 (*)     Neutrophils Relative 82 (*)     Neutro Abs 8.9 (*)     Lymphocytes Relative 9 (*)     All other components within normal limits  URINALYSIS, ROUTINE W REFLEX MICROSCOPIC - Abnormal; Notable for the following:    APPearance TURBID (*)     Glucose, UA 100 (*)     Hgb urine dipstick MODERATE (*)     Leukocytes, UA LARGE (*)     All other components within normal limits  GLUCOSE, CAPILLARY - Abnormal; Notable for the following:    Glucose-Capillary 475 (*)      All other components within normal limits  URINE MICROSCOPIC-ADD ON   No results found.   No diagnosis found.  CRITICAL CARE Performed by: Nicholes Stairs   Total critical care time: 30 min  Critical care time was exclusive  of separately billable procedures and treating other patients.  Critical care was necessary to treat or prevent imminent or life-threatening deterioration.  Critical care was time spent personally by me on the following activities: development of treatment plan with patient and/or surrogate as well as nursing, discussions with consultants, evaluation of patient's response to treatment, examination of patient, obtaining history from patient or surrogate, ordering and performing treatments and interventions, ordering and review of laboratory studies, ordering and review of radiographic studies, pulse oximetry and re-evaluation of patient's condition.   7:41 PM Spoke with FP resident.  She will come admit  MDM   Hyperglycemia without ketonuria Metabolic acidosis. Acute renal failure, without hyperkalemia Dehydration       Cheri Guppy, MD 01/31/12 1900  Cheri Guppy, MD 01/31/12 4742

## 2012-01-31 NOTE — H&P (Signed)
Family Medicine Teaching [facility] Admission History and Physical Service Pager: [phone]  Patient name: Tina [name] Medical record number: [id number] Date of birth: [birth year] Age: 71 y.o. Gender: female  Primary Care Provider: Carney Living, MD  Chief Complaint: Diarrhea, weakness  History of Present Illness: Tina [name] is a 71 y.o. year old female  with a history of DM, CKD stage III, HTN, AF presenting with diarrhea, hyperglycemia and possible dehydration. She presented to the ED this evening via ambulance. History provided by family to EDP initially. She was admitted at outside facility (reported [place]) 2 weeks ago and treated for diverticulitis, discharged on antibiotics. Has continued loose stools, abdominal pain, decreased oral intake. Found to have hyperglycemia > 500, cr 5.5, BUN 92. Patient given percocet, started on insulin drip and [facility] asked to admit for dehydration and further workup.   Level V caveat for lethargy/AMS after receiving percocet and family has departed at time of interview.  Patient Active Problem List  Diagnosis  . CARCINOMA, SKIN, SQUAMOUS CELL  . DM (diabetes mellitus) with complications  . DIABETIC PERIPHERAL NEUROPATHY  . HYPERLIPIDEMIA  . OBESITY NOS  . ANEMIA NEC  . ESSENTIAL HYPERTENSION  . GERD  . CHRONIC KIDNEY DISEASE STAGE III (MODERATE)  . DEGENERATIVE JOINT DISEASE  . INSOMNIA  . Atrial fibrillation   Past Medical History: Past Medical History  Diagnosis Date  . Normal exercise sestamibi stress test 2007    [facility]  . Diabetes mellitus   . Hyperlipidemia   . Hypertension   . Heart murmur   . Chronic kidney disease     CKD stage 3  . Depression   . Stroke 06/2011    TIA    Past Surgical History: Past Surgical History  Procedure Date  . Bladder surgery 1994  . Knee surgery as child  . Abdominal hysterectomy    Social History: History  Substance Use Topics  . Smoking status: Never Smoker   .  Smokeless tobacco: Never Used  . Alcohol Use: No   For any additional social history documentation, please refer to relevant sections of EMR.  Family History: Family History  Problem Relation Age of Onset  . Coronary artery disease Mother   . Diabetes Mother   . Heart disease Mother   . Diabetes Sister   . Hypertension Sister   . Cancer Father    Allergies: No Known Allergies No current facility-administered medications on file prior to encounter.   Current Outpatient Prescriptions on File Prior to Encounter  Medication Sig Dispense Refill  . diclofenac sodium (VOLTAREN) 1 % GEL Apply 1 application topically 4 (four) times daily as needed. As needed for pain  100 g  0  . diltiazem (TIAZAC) 240 MG 24 hr capsule Take 240 mg by mouth daily.      . enalapril (VASOTEC) 20 MG tablet Take 1 tablet (20 mg total) by mouth daily.  30 tablet  3  . insulin glargine (LANTUS) 100 UNIT/ML injection Inject 24 Units into the skin at bedtime.  10 mL    . temazepam (RESTORIL) 15 MG capsule Take 15 mg by mouth at bedtime as needed. For sleep      . torsemide (DEMADEX) 20 MG tablet Take 20 mg by mouth daily.       Review Of Systems: Per HPI with the following additions: level 5 caveat Otherwise 12 point review of systems was performed and was unremarkable.  Physical Exam: BP 113/67  Pulse 98  Temp 97.6 F (36.4  C) (Oral)  Resp 19  SpO2 93% Exam: General: lethargic, falls asleep during sentences. Responds to sternal rubbing. HEENT: NCAT Cardiovascular: RRR, no murmur Respiratory: nonlabored, CTAB. No wheezing or rales Abdomen: obese, mild distention. Diffusely TTP, no distinct masses, no rebound or guarding, BS active Extremities: 2+ pitting edema bilaterally. No redness noted. Skin: no ecchymosis or rash noted  Neuro: lethargic. Not oriented to date, does know city and place. R>L hand asterixis. Speech is slowed.  Labs and Imaging: CBC BMET   Lab 01/31/12 1733  WBC 10.9*  HGB 10.8*    HCT 31.1*  PLT 153    Lab 01/31/12 1733  NA 131*  K 4.3  CL 92*  CO2 17*  BUN 92*  CREATININE 5.54*  GLUCOSE 474*  CALCIUM 7.8*       Assessment and Plan: Tina [name] is a 71 y.o. year old female with a history of DM, CKD stage III, HTN, AF presenting with diarrhea/abdominal pain, weakness and found to have hyperglycemia, and acute on chronic kidney injury with mild acidosis.  1. Abdominal pain/diarrhea. Concern for intra-abdominal pathology with recent diverticulitis and abx exposure. Mild leukocytosis, but no fever or hemodynamic instabilty to indicate severe infection. Check stool c diff, CT abd w/o contrast, place foley to rule out bladder outlet obstruction. Avoid narcotics with resultant lethargy/confusion. NPO with IVF hydration. Start empiric IV ciprofloxacin/flagyl pending results.    2. Acute on chronic renal failure. Last cr in epic is 1.8 prior to today. Elevated BUN and mild uremia but believe lethargy is secondary to pain medication currently, so no acute needs for HD (no cardiopulmonary manifestations of volume overload). Place foley to rule out obstructive cause. Check FENA, f/u CT abdomen to evaluate sign of hydronephrosis or pyelonephritis. Repeat urine sample from catheterized specimen to evaluate for infection. Check culture, gram stain. Continue IVF at 125/hr awaiting results, may be related to dehydration from prolonged diarrhea. Hold home torsemide and home enalapril 20 mg daily. Plan to obtain records from [place] with more recent renal function tests and consult renal if not improving by tomorrow.   3. Acidosis. Mild metabolic, possibly multifactorial with hyperglycemia and renal failure. Repeat BMET this pm. Consider starting bicarb with fluids if not improving with NS.   4. Insomnia. Home meds include restoril and xanax. Will hold in acute setting, may restart tomorrow to prevent withdrawal syndrome.   5. Diabetes with uncontrolled hyperglycemia.  No urinary ketones. Improved CBGs to mid-300s on IV insulin gtt. Continue glucomander protocol with transition to D51/2NS with values <200. Would restart home lantus and novolog after stable.   6. HTN. Hold home ACEi and torsemide.  7. Atrial fibrillation. Rate controlled. Continue diltiazem. Hold xarelto with renal failure currently. Check INR. Consider heparin bridge, CHADs score 2.  8. FEN/GI: NPO. NS at 125/hr. F/u electrolytes. zofran prn.  9. Prophylaxis: heparin sq, consider full bridge with borderline CHADs score.   10. Disposition: to telemetry. PT/OT ordered with weakness. Maybe prolonged course with severe renal failure.  11. Code Status: plan to discuss with family, none present and patient unable to consent  Lloyd Huger, MD [facility] Resident - PGY-3 01/31/2012 10:38 PM

## 2012-01-31 NOTE — ED Notes (Signed)
Missy- [phone]

## 2012-01-31 NOTE — Progress Notes (Signed)
Received a nwly admitted patient from ED, came to the unit per stretcher, escorted by ED RN. Patient is lethargic, not in obvious distress. Disorientation noted. Placed in her bed in a comfortable position, vital signs checked and recorded. Routine initial admission care provided. On Insulin drip per protocol. Will continue to monitor.

## 2012-02-01 ENCOUNTER — Inpatient Hospital Stay (HOSPITAL_COMMUNITY): Payer: Medicare HMO

## 2012-02-01 ENCOUNTER — Encounter (HOSPITAL_COMMUNITY): Payer: Self-pay

## 2012-02-01 DIAGNOSIS — N179 Acute kidney failure, unspecified: Secondary | ICD-10-CM

## 2012-02-01 DIAGNOSIS — F0390 Unspecified dementia without behavioral disturbance: Secondary | ICD-10-CM

## 2012-02-01 DIAGNOSIS — L89309 Pressure ulcer of unspecified buttock, unspecified stage: Secondary | ICD-10-CM

## 2012-02-01 DIAGNOSIS — L97209 Non-pressure chronic ulcer of unspecified calf with unspecified severity: Secondary | ICD-10-CM

## 2012-02-01 DIAGNOSIS — I5189 Other ill-defined heart diseases: Secondary | ICD-10-CM | POA: Diagnosis present

## 2012-02-01 DIAGNOSIS — I369 Nonrheumatic tricuspid valve disorder, unspecified: Secondary | ICD-10-CM

## 2012-02-01 LAB — URINALYSIS, ROUTINE W REFLEX MICROSCOPIC
Nitrite: NEGATIVE
Specific Gravity, Urine: 1.012 (ref 1.005–1.030)
Urobilinogen, UA: 0.2 mg/dL (ref 0.0–1.0)

## 2012-02-01 LAB — GLUCOSE, CAPILLARY
Glucose-Capillary: 156 mg/dL — ABNORMAL HIGH (ref 70–99)
Glucose-Capillary: 148 mg/dL — ABNORMAL HIGH (ref 70–99)
Glucose-Capillary: 163 mg/dL — ABNORMAL HIGH (ref 70–99)
Glucose-Capillary: 153 mg/dL — ABNORMAL HIGH (ref 70–99)

## 2012-02-01 LAB — CREATININE, URINE, RANDOM: Creatinine, Urine: 57.6 mg/dL

## 2012-02-01 LAB — COMPREHENSIVE METABOLIC PANEL
ALT: 14 U/L (ref 0–35)
AST: 25 U/L (ref 0–37)
Albumin: 2.6 g/dL — ABNORMAL LOW (ref 3.5–5.2)
Alkaline Phosphatase: 89 U/L (ref 39–117)
CO2: 18 mEq/L — ABNORMAL LOW (ref 19–32)
Chloride: 96 mEq/L (ref 96–112)
Potassium: 3.9 mEq/L (ref 3.5–5.1)
Total Bilirubin: 0.5 mg/dL (ref 0.3–1.2)

## 2012-02-01 LAB — BASIC METABOLIC PANEL
Calcium: 7.4 mg/dL — ABNORMAL LOW (ref 8.4–10.5)
Chloride: 97 mEq/L (ref 96–112)
GFR calc Af Amer: 8 mL/min — ABNORMAL LOW (ref >90)
GFR calc Af Amer: 9 mL/min — ABNORMAL LOW (ref >90)
GFR calc non Af Amer: 7 mL/min — ABNORMAL LOW (ref >90)
GFR calc non Af Amer: 8 mL/min — ABNORMAL LOW (ref >90)
Glucose, Bld: 173 mg/dL — ABNORMAL HIGH (ref 70–99)
Glucose, Bld: 161 mg/dL — ABNORMAL HIGH (ref 70–99)
Potassium: 3.8 mEq/L (ref 3.5–5.1)
Potassium: 4 mEq/L (ref 3.5–5.1)
Sodium: 135 mEq/L (ref 135–145)
Sodium: 133 mEq/L — ABNORMAL LOW (ref 135–145)

## 2012-02-01 LAB — BLOOD GAS, ARTERIAL
Bicarbonate: 18.1 mEq/L — ABNORMAL LOW (ref 20.0–24.0)
TCO2: 19.2 mmol/L (ref 0–100)
pCO2 arterial: 36.1 mmHg (ref 35.0–45.0)
pH, Arterial: 7.321 — ABNORMAL LOW (ref 7.350–7.450)

## 2012-02-01 LAB — PROTIME-INR
INR: 1.23 (ref 0.00–1.49)
Prothrombin Time: 15.3 seconds — ABNORMAL HIGH (ref 11.6–15.2)

## 2012-02-01 LAB — GRAM STAIN

## 2012-02-01 LAB — SODIUM, URINE, RANDOM: Sodium, Ur: 78 mEq/L

## 2012-02-01 LAB — RETICULOCYTES
RBC.: 3.36 MIL/uL — ABNORMAL LOW (ref 3.87–5.11)
Retic Ct Pct: 1.3 % (ref 0.4–3.1)

## 2012-02-01 LAB — URINE MICROSCOPIC-ADD ON

## 2012-02-01 LAB — CBC
HCT: 27.8 % — ABNORMAL LOW (ref 36.0–46.0)
Hemoglobin: 9.2 g/dL — ABNORMAL LOW (ref 12.0–15.0)
MCH: 27.1 pg (ref 26.0–34.0)
MCHC: 33.1 g/dL (ref 30.0–36.0)

## 2012-02-01 LAB — AMMONIA: Ammonia: 22 umol/L (ref 11–60)

## 2012-02-01 MED ORDER — IOHEXOL 300 MG/ML  SOLN
20.0000 mL | INTRAMUSCULAR | Status: AC
  Administered 2012-02-01 (×2): 300 mL via ORAL

## 2012-02-01 MED ORDER — INSULIN GLARGINE 100 UNIT/ML ~~LOC~~ SOLN: 10.0000 [IU] | Freq: Every day | SUBCUTANEOUS | Status: DC

## 2012-02-01 MED ORDER — SODIUM BICARBONATE 8.4 % IV SOLN
INTRAVENOUS | Status: DC
  Administered 2012-02-01: 11:00:00 via INTRAVENOUS
  Filled 2012-02-01 (×2): qty 50

## 2012-02-01 MED ORDER — INSULIN ASPART 100 UNIT/ML ~~LOC~~ SOLN
0.0000 [IU] | Freq: Three times a day (TID) | SUBCUTANEOUS | Status: DC
  Administered 2012-02-01 (×2): 1 [IU] via SUBCUTANEOUS
  Administered 2012-02-01: 2 [IU] via SUBCUTANEOUS

## 2012-02-01 MED ORDER — PIPERACILLIN-TAZOBACTAM IN DEX 2-0.25 GM/50ML IV SOLN
2.2500 g | Freq: Four times a day (QID) | INTRAVENOUS | Status: DC
  Administered 2012-02-01 – 2012-02-02 (×5): 2.25 g via INTRAVENOUS
  Filled 2012-02-01 (×8): qty 50

## 2012-02-01 MED ORDER — INSULIN ASPART 100 UNIT/ML ~~LOC~~ SOLN
0.0000 [IU] | SUBCUTANEOUS | Status: DC
  Administered 2012-02-02 (×4): 1 [IU] via SUBCUTANEOUS
  Administered 2012-02-03: 5 [IU] via SUBCUTANEOUS
  Administered 2012-02-03: 2 [IU] via SUBCUTANEOUS
  Administered 2012-02-03 (×2): 1 [IU] via SUBCUTANEOUS
  Administered 2012-02-03 (×2): 3 [IU] via SUBCUTANEOUS
  Administered 2012-02-04: 5 [IU] via SUBCUTANEOUS
  Administered 2012-02-04: 1 [IU] via SUBCUTANEOUS
  Administered 2012-02-04: 3 [IU] via SUBCUTANEOUS
  Administered 2012-02-04: 5 [IU] via SUBCUTANEOUS
  Administered 2012-02-04: 3 [IU] via SUBCUTANEOUS

## 2012-02-01 MED ORDER — VANCOMYCIN HCL 1000 MG IV SOLR
2000.0000 mg | Freq: Once | INTRAVENOUS | Status: AC
  Administered 2012-02-01: 2000 mg via INTRAVENOUS
  Filled 2012-02-01: qty 2000

## 2012-02-01 MED ORDER — SODIUM CHLORIDE 0.9 % IV SOLN: INTRAVENOUS | Status: DC

## 2012-02-01 MED ORDER — MAGNESIUM SULFATE 40 MG/ML IJ SOLN
2.0000 g | Freq: Once | INTRAMUSCULAR | Status: AC
  Administered 2012-02-01: 2 g via INTRAVENOUS
  Filled 2012-02-01: qty 50

## 2012-02-01 MED ORDER — FLUCONAZOLE 150 MG PO TABS
150.0000 mg | ORAL_TABLET | Freq: Every day | ORAL | Status: AC
  Administered 2012-02-01 – 2012-02-03 (×3): 150 mg via ORAL
  Filled 2012-02-01 (×3): qty 1

## 2012-02-01 MED ORDER — DILTIAZEM HCL ER BEADS 120 MG PO CP24
120.0000 mg | ORAL_CAPSULE | Freq: Every day | ORAL | Status: DC
  Administered 2012-02-03 – 2012-02-06 (×4): 120 mg via ORAL
  Filled 2012-02-01 (×5): qty 1

## 2012-02-01 MED ORDER — SODIUM CHLORIDE 0.9 % IV BOLUS (SEPSIS)
500.0000 mL | Freq: Once | INTRAVENOUS | Status: AC
  Administered 2012-02-01: 500 mL via INTRAVENOUS

## 2012-02-01 MED ORDER — INSULIN GLARGINE 100 UNIT/ML ~~LOC~~ SOLN
10.0000 [IU] | Freq: Every day | SUBCUTANEOUS | Status: DC
  Administered 2012-02-01 – 2012-02-03 (×3): 10 [IU] via SUBCUTANEOUS

## 2012-02-01 MED ORDER — IOHEXOL 300 MG/ML  SOLN: 20.0000 mL | INTRAMUSCULAR | Status: AC

## 2012-02-01 NOTE — H&P (Signed)
FMTS Attending Admit Note Patient seen and examined by me, discussed with resident team and I agree with management as documented.  I am seeing Ms. Mcfate upon her return from CT scanner; she is awake and alert and able to give some history.  Reports feeling somewhat better but does report nonfocal abdominal pain. Reports recent hospitalization at [facility] after 2 weeks of diarrhea and chills.  Tells me that she was going to have her gallbladder out "until they cancelled the surgery because of my heart". Review of records here shows ECHO in Feb 2013 with normal ejection fraction and no wall motion abnormalities.   Exam; Alert, oriented to "[facility]" (in [facility]); date is "23rd", cannot give month/year.  Dry mucus membranes. Neck supple.  Regular S1S2 PULM Clear to exam.  ABD soft but with diffuse tenderness. No masses noted. Foley bag with turbid urine.  SKIN: Skin breakdown on both heels, with sheepskin coverings. Sacral decub (stage 2) dressed, with some areas of healing that looks chronic.  2-3+ doughy edema both ankles/legs.  CT abd without contrast today is significant for 4mm stone in L ureter; other nonobstructing stones.  Cholelithiasis without evidence of cholecystitis. Question bibasilar atelectasis versus infiltrate.   Assess/Plan: Patient admitted with dehydration and question acute-on-chronic renal failure; prolonged diarrhoeal illness (per family report in ED) and reported recent abx treatment for diverticulitis; now with apparent pyelonephritis.  The patient's history and recent hospitalization at outside hospital raise several questions about her recent state of health; priority to be given to tracking down this history. For the time being, we will treat for presumed pyelonephritis and possible HCAP given findings on CT and scant history. C diff is to be collected.  Hydration and close follow up of renal status.  Old records from recent hospitalization will be a good  measure of her more recent baseline renal function.  Strain urine for stones, as provide nidus for infection.  To follow for passage of 4mm stone.  No remarks about hydronephrosis is made on CT.  Analgesia.  Paula Compton, MD

## 2012-02-01 NOTE — Progress Notes (Signed)
ANTIBIOTIC CONSULT NOTE - INITIAL  Pharmacy Consult for vancomycin, Zosyn Indication: rule out sepsis  No Known Allergies  Patient Measurements: Weight: 260 lb 12.9 oz (118.3 kg)   Vital Signs: Temp: 97.5 F (36.4 C) (09/22 0336) Temp src: Oral (09/22 0336) BP: 103/50 mmHg (09/22 0322) Pulse Rate: 80  (09/22 0336) Intake/Output from previous day: 09/21 0701 - 09/22 0700 In: -  Out: 400 [Urine:400] Intake/Output from this shift: Total I/O In: -  Out: 400 [Urine:400]  Labs:  [facility] 02/01/12 0014 01/31/12 2350 01/31/12 1733  WBC -- -- 10.9*  HGB -- -- 10.8*  PLT -- -- 153  LABCREA -- 57.60 --  CREATININE 5.73* -- 5.54*   The CrCl is unknown because both a height and weight (above a minimum accepted value) are required for this calculation. No results found for this basename: VANCOTROUGH:2,VANCOPEAK:2,VANCORANDOM:2,GENTTROUGH:2,GENTPEAK:2,GENTRANDOM:2,TOBRATROUGH:2,TOBRAPEAK:2,TOBRARND:2,AMIKACINPEAK:2,AMIKACINTROU:2,AMIKACIN:2, in the last 72 hours   Microbiology: Recent Results (from the past 720 hour(s))  GRAM STAIN     Status: Normal   Collection Time   01/31/12 11:51 PM      Component Value Range Status Comment   Specimen Description URINE, CATHETERIZED   Final    Special Requests NONE   Final    Gram Stain     Final    Value: CYTOSPIN SLIDE     WBC PRESENT,BOTH PMN AND MONONUCLEAR     GRAM POSITIVE RODS     YEAST   Report Status 02/01/2012 FINAL   Final     Medical History: Past Medical History  Diagnosis Date  . Normal exercise sestamibi stress test 2007    [facility]  . Diabetes mellitus   . Hyperlipidemia   . Hypertension   . Heart murmur   . Chronic kidney disease     CKD stage 3  . Depression   . Stroke 06/2011    TIA     Medications:  Scheduled:    . ciprofloxacin  200 mg Intravenous Q24H  . citalopram  20 mg Oral Daily  . diltiazem  240 mg Oral Daily  . insulin aspart  0-9 Units Subcutaneous TID WC  . insulin glargine  10 Units  Subcutaneous QHS  . insulin regular  0-10 Units Intravenous TID WC  . iohexol  20 mL Oral Q1 Hr x 2  . magnesium sulfate 1 - 4 g bolus IVPB  2 g Intravenous Once  . metronidazole  500 mg Intravenous Q8H  . oxyCODONE-acetaminophen  2 tablet Oral Once  . pregabalin  75 mg Oral BID  . sodium chloride  500 mL Intravenous Once  . sodium chloride  3 mL Intravenous Q12H  . DISCONTD:  morphine injection  4 mg Intravenous Once   Assessment: 71 yo female admitted with hyperglycemia. Pharmacy consulted to manage vancomycin and Zosyn for r/o sepsis.   Goal of Therapy:  Vancomycin trough 10 - 20 mcg/mL  Plan:  1. Zosyn 2.25gm IV Q6H (30 minute infusion) 2. Vancomycin 2gm IV x 1. 3. Follow-up renal function for further vancomycin dosing.   Emeline Gins 02/01/2012,3:57 AM
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
## 2012-02-01 NOTE — Progress Notes (Addendum)
Patient transferred to step down unit 2502 per bed. Not in any form of distress. Report handed over to [place], Charity fundraiser.  Tried to reach family members to inform about the transfer, no answer, left a voice message.

## 2012-02-01 NOTE — Progress Notes (Signed)
FMTS Attending Note Patient seen and examined by me, discussed with Dr Cristal Ford and I agree with her plan.  Please see my separate note from today, appended to H&P.  Paula Compton, MD

## 2012-02-01 NOTE — Progress Notes (Signed)
Family Practice Teaching Service  Pager [phone]  Subjective: Patient mentation cleared last evening. She was able to wake up and drink oral contrast for CT scan. She relates recent diarrhea and diffuse/right abdominal pain that has settled currently. States her legs are always swollen to this degree. C/o mild dyspnea with 2 L O2 placed now.   BP became low overnight to 80-90 systolic/40s diastolic that improved with 250 cc bolus.  Objective: Vital signs in last 24 hours: Temp:  [97.5 F (36.4 C)-97.6 F (36.4 C)] 97.6 F (36.4 C) (09/22 0544) Pulse Rate:  [58-98] 82  (09/22 0544) Resp:  [12-24] 12  (09/22 0544) BP: (84-129)/(43-91) 116/91 mmHg (09/22 0544) SpO2:  [93 %-100 %] 93 % (09/22 0544) Weight:  [260 lb 12.9 oz (118.3 kg)-261 lb 3.9 oz (118.5 kg)] 261 lb 3.9 oz (118.5 kg) (09/22 0544) Weight change:     Intake/Output from previous day: 09/21 0701 - 09/22 0700 In: -  Out: 400 [Urine:400] Intake/Output this shift:    Physical Exam:  General: alert and appropriate today. Answers questions.  HEENT: NCAT  Cardiovascular: RRR, no murmur  Respiratory: nonlabored, CTAB.bibasilar rales today. No increased WOB Abdomen: obese, mild distention. Diffusely TTP, no distinct masses, no rebound or guarding, BS active  Extremities: 2+ pitting edema bilaterally. No redness noted. Has bilateral heal pressure ulcers with transparent tape over top and heal floats, TTP. Skin: no ecchymosis noted Neuro: alert, oriented x 3. Normal speech. R>L hand asterixis.   Lab Results:  [facility] 01/31/12 1733  WBC 10.9*  HGB 10.8*  HCT 31.1*  PLT 153   BMET  Basename 02/01/12 0014 01/31/12 1733  NA 135 131*  K 3.8 4.3  CL 97 92*  CO2 20 17*  GLUCOSE 173* 474*  BUN 92* 92*  CREATININE 5.73* 5.54*  CALCIUM 7.5* 7.8*    Results for orders placed during the hospital encounter of 01/31/12 (from the past 24 hour(s))  GLUCOSE, CAPILLARY     Status: Abnormal   Collection Time   01/31/12   4:30 PM      Component Value Range   Glucose-Capillary 506 (*) 70 - 99 mg/dL   Comment 1 Notify RN    BASIC METABOLIC PANEL     Status: Abnormal   Collection Time   01/31/12  5:33 PM      Component Value Range   Sodium 131 (*) 135 - 145 mEq/L   Potassium 4.3  3.5 - 5.1 mEq/L   Chloride 92 (*) 96 - 112 mEq/L   CO2 17 (*) 19 - 32 mEq/L   Glucose, Bld 474 (*) 70 - 99 mg/dL   BUN 92 (*) 6 - 23 mg/dL   Creatinine, Ser 9.56 (*) 0.50 - 1.10 mg/dL   Calcium 7.8 (*) 8.4 - 10.5 mg/dL   GFR calc non Af Amer 7 (*) >90 mL/min   GFR calc Af Amer 8 (*) >90 mL/min  CBC WITH DIFFERENTIAL     Status: Abnormal   Collection Time   01/31/12  5:33 PM      Component Value Range   WBC 10.9 (*) 4.0 - 10.5 K/uL   RBC 3.88  3.87 - 5.11 MIL/uL   Hemoglobin 10.8 (*) 12.0 - 15.0 g/dL   HCT 21.3 (*) 08.6 - 57.8 %   MCV 80.2  78.0 - 100.0 fL   MCH 27.8  26.0 - 34.0 pg   MCHC 34.7  30.0 - 36.0 g/dL   RDW 46.9  62.9 - 52.8 %  Platelets 153  150 - 400 K/uL   Neutrophils Relative 82 (*) 43 - 77 %   Neutro Abs 8.9 (*) 1.7 - 7.7 K/uL   Lymphocytes Relative 9 (*) 12 - 46 %   Lymphs Abs 1.0  0.7 - 4.0 K/uL   Monocytes Relative 8  3 - 12 %   Monocytes Absolute 0.9  0.1 - 1.0 K/uL   Eosinophils Relative 1  0 - 5 %   Eosinophils Absolute 0.1  0.0 - 0.7 K/uL   Basophils Relative 0  0 - 1 %   Basophils Absolute 0.0  0.0 - 0.1 K/uL  URINALYSIS, ROUTINE W REFLEX MICROSCOPIC     Status: Abnormal   Collection Time   01/31/12  5:56 PM      Component Value Range   Color, Urine YELLOW  YELLOW   APPearance TURBID (*) CLEAR   Specific Gravity, Urine 1.013  1.005 - 1.030   pH 5.0  5.0 - 8.0   Glucose, UA 100 (*) NEGATIVE mg/dL   Hgb urine dipstick MODERATE (*) NEGATIVE   Bilirubin Urine NEGATIVE  NEGATIVE   Ketones, ur NEGATIVE  NEGATIVE mg/dL   Protein, ur NEGATIVE  NEGATIVE mg/dL   Urobilinogen, UA 0.2  0.0 - 1.0 mg/dL   Nitrite NEGATIVE  NEGATIVE   Leukocytes, UA LARGE (*) NEGATIVE  URINE MICROSCOPIC-ADD ON      Status: Normal   Collection Time   01/31/12  5:56 PM      Component Value Range   WBC, UA 21-50  <3 WBC/hpf  GLUCOSE, CAPILLARY     Status: Abnormal   Collection Time   01/31/12  5:57 PM      Component Value Range   Glucose-Capillary 475 (*) 70 - 99 mg/dL  GLUCOSE, CAPILLARY     Status: Abnormal   Collection Time   01/31/12  7:06 PM      Component Value Range   Glucose-Capillary 375 (*) 70 - 99 mg/dL  GLUCOSE, CAPILLARY     Status: Abnormal   Collection Time   01/31/12  8:22 PM      Component Value Range   Glucose-Capillary 354 (*) 70 - 99 mg/dL  GLUCOSE, CAPILLARY     Status: Abnormal   Collection Time   01/31/12  9:36 PM      Component Value Range   Glucose-Capillary 263 (*) 70 - 99 mg/dL  GLUCOSE, CAPILLARY     Status: Abnormal   Collection Time   01/31/12 10:43 PM      Component Value Range   Glucose-Capillary 206 (*) 70 - 99 mg/dL  URINALYSIS, ROUTINE W REFLEX MICROSCOPIC     Status: Abnormal   Collection Time   01/31/12 11:50 PM      Component Value Range   Color, Urine YELLOW  YELLOW   APPearance TURBID (*) CLEAR   Specific Gravity, Urine 1.012  1.005 - 1.030   pH 5.0  5.0 - 8.0   Glucose, UA 100 (*) NEGATIVE mg/dL   Hgb urine dipstick MODERATE (*) NEGATIVE   Bilirubin Urine NEGATIVE  NEGATIVE   Ketones, ur NEGATIVE  NEGATIVE mg/dL   Protein, ur NEGATIVE  NEGATIVE mg/dL   Urobilinogen, UA 0.2  0.0 - 1.0 mg/dL   Nitrite NEGATIVE  NEGATIVE   Leukocytes, UA LARGE (*) NEGATIVE  SODIUM, URINE, RANDOM     Status: Normal   Collection Time   01/31/12 11:50 PM      Component Value Range   Sodium, Ur 78  CREATININE, URINE, RANDOM     Status: Normal   Collection Time   01/31/12 11:50 PM      Component Value Range   Creatinine, Urine 57.60    URINE MICROSCOPIC-ADD ON     Status: Abnormal   Collection Time   01/31/12 11:50 PM      Component Value Range   Squamous Epithelial / LPF RARE  RARE   WBC, UA 21-50  <3 WBC/hpf   RBC / HPF 0-2  <3 RBC/hpf   Bacteria, UA MANY  (*) RARE   Urine-Other YEAST    GRAM STAIN     Status: Normal   Collection Time   01/31/12 11:51 PM      Component Value Range   Specimen Description URINE, CATHETERIZED     Special Requests NONE     Gram Stain       Value: CYTOSPIN SLIDE     WBC PRESENT,BOTH PMN AND MONONUCLEAR     GRAM POSITIVE RODS     YEAST   Report Status 02/01/2012 FINAL    GLUCOSE, CAPILLARY     Status: Abnormal   Collection Time   02/01/12 12:03 AM      Component Value Range   Glucose-Capillary 179 (*) 70 - 99 mg/dL   Comment 1 Documented in Chart     Comment 2 Notify RN    BASIC METABOLIC PANEL     Status: Abnormal   Collection Time   02/01/12 12:14 AM      Component Value Range   Sodium 135  135 - 145 mEq/L   Potassium 3.8  3.5 - 5.1 mEq/L   Chloride 97  96 - 112 mEq/L   CO2 20  19 - 32 mEq/L   Glucose, Bld 173 (*) 70 - 99 mg/dL   BUN 92 (*) 6 - 23 mg/dL   Creatinine, Ser 8.84 (*) 0.50 - 1.10 mg/dL   Calcium 7.5 (*) 8.4 - 10.5 mg/dL   GFR calc non Af Amer 7 (*) >90 mL/min   GFR calc Af Amer 8 (*) >90 mL/min  PHOSPHORUS     Status: Abnormal   Collection Time   02/01/12 12:14 AM      Component Value Range   Phosphorus 8.2 (*) 2.3 - 4.6 mg/dL  MAGNESIUM     Status: Abnormal   Collection Time   02/01/12 12:14 AM      Component Value Range   Magnesium 1.4 (*) 1.5 - 2.5 mg/dL  GLUCOSE, CAPILLARY     Status: Abnormal   Collection Time   02/01/12  1:12 AM      Component Value Range   Glucose-Capillary 168 (*) 70 - 99 mg/dL  GLUCOSE, CAPILLARY     Status: Abnormal   Collection Time   02/01/12  2:06 AM      Component Value Range   Glucose-Capillary 170 (*) 70 - 99 mg/dL   Comment 1 Documented in Chart     Comment 2 Notify RN    GLUCOSE, CAPILLARY     Status: Abnormal   Collection Time   02/01/12  3:14 AM      Component Value Range   Glucose-Capillary 156 (*) 70 - 99 mg/dL  GLUCOSE, CAPILLARY     Status: Abnormal   Collection Time   02/01/12  4:19 AM      Component Value Range    Glucose-Capillary 147 (*) 70 - 99 mg/dL   Comment 1 Documented in Chart     Comment 2  Notify RN    BLOOD GAS, ARTERIAL     Status: Abnormal   Collection Time   02/01/12  4:49 AM      Component Value Range   O2 Content 2.0     Delivery systems NASAL CANNULA     pH, Arterial 7.321 (*) 7.350 - 7.450   pCO2 arterial 36.1  35.0 - 45.0 mmHg   pO2, Arterial 89.4  80.0 - 100.0 mmHg   Bicarbonate 18.1 (*) 20.0 - 24.0 mEq/L   TCO2 19.2  0 - 100 mmol/L   Acid-base deficit 6.8 (*) 0.0 - 2.0 mmol/L   O2 Saturation 97.4     Patient temperature 98.6     Collection site RIGHT RADIAL     Drawn by [id number]     Sample type ARTERIAL DRAW     Allens test (pass/fail) PASS  PASS  GLUCOSE, CAPILLARY     Status: Abnormal   Collection Time   02/01/12  4:56 AM      Component Value Range   Glucose-Capillary 134 (*) 70 - 99 mg/dL  GLUCOSE, CAPILLARY     Status: Abnormal   Collection Time   02/01/12  5:32 AM      Component Value Range   Glucose-Capillary 136 (*) 70 - 99 mg/dL    Studies/Results: Ct Abdomen Pelvis Wo Contrast 02/01/12 IMPRESSION:  1.  Approximately 4 mm stone within the superior aspect of the left ureter, just caudal to the left UPJ without associated upstream pelvocaliectasis or perinephric stranding. 2.  Unchanged 5 mm nonobstructing stone within the inferior pole of the left kidney. 3.  Moderate to large colonic stool burden without evidence of enteric obstruction. 4.  Cholelithiasis without definite evidence of cholecystitis.  5.  Cardiomegaly.  6.  Asymmetric left lower lobe air space opacities with bronchograms, possibly atelectasis, though worrisome for infection.   Original Report Authenticated By: Waynard Reeds, M.D.     Portable Chest 1 View  02/01/2012  *RADIOLOGY REPORT*  Clinical Data: Renal failure  PORTABLE CHEST - 1 VIEW  Comparison: 07/06/2011  Findings: Heart size is normal.  The lung volumes are low.  There is pulmonary vascular congestion.  Atelectasis is noted in the left  base.  No airspace consolidation noted.  IMPRESSION:  1.  Low lung volumes and left base atelectasis. 2.  Pulmonary vascular congestion.   Original Report Authenticated By: Rosealee Albee, M.D.     Medications:  Scheduled:   . ciprofloxacin  200 mg Intravenous Q24H  . citalopram  20 mg Oral Daily  . diltiazem  240 mg Oral Daily  . insulin aspart  0-9 Units Subcutaneous TID WC  . insulin glargine  10 Units Subcutaneous QHS  . insulin regular  0-10 Units Intravenous TID WC  . iohexol  20 mL Oral Q1 Hr x 2  . iohexol  20 mL Oral Q1 Hr x 2  . magnesium sulfate 1 - 4 g bolus IVPB  2 g Intravenous Once  . metronidazole  500 mg Intravenous Q8H  . oxyCODONE-acetaminophen  2 tablet Oral Once  . piperacillin-tazobactam (ZOSYN)  IV  2.25 g Intravenous Q6H  . pregabalin  75 mg Oral BID  . sodium chloride  500 mL Intravenous Once  . sodium chloride  3 mL Intravenous Q12H  . vancomycin  2,000 mg Intravenous Once  . DISCONTD: insulin glargine  10 Units Subcutaneous QHS  . DISCONTD: insulin glargine  10 Units Subcutaneous QHS  . DISCONTD:  morphine injection  4 mg Intravenous Once   Continuous:   . sodium chloride 125 mL/hr at 01/31/12 1806  . dextrose 5 % and 0.45% NaCl 125 mL/hr at 01/31/12 2248  . insulin (NOVOLIN-R) infusion 1.7 Units/hr (02/01/12 0421)  . DISCONTD: sodium chloride    . DISCONTD: insulin (NOVOLIN-R) infusion 4.4 Units/hr (01/31/12 2249)  . DISCONTD: insulin (NOVOLIN-R) infusion 1.9 Units/hr (02/01/12 0300)    Assessment/Plan: Tina [name] is a 71 y.o. year old female with a history of DM, CKD stage III, HTN, AF presenting with diarrhea/abdominal pain, weakness and found to have hyperglycemia, and acute on chronic kidney injury with mild acidosis.   1. Abdominal pain/diarrhea. No acute intraabdominal process in abdomen detected by CT, non-obstructing left renal stones not likely contributory. She does have a large stool burden. Check stool c diff, LFTs. Avoid  narcotics with resultant lethargy/confusion. Empiric IV ciprol/flagyl to cover c diff/colitis pending results. Hypotension last evening was transient and resolved, since abdominal etiology was unknown and concern for infection, cultures were drawn and she was started on empiric vancomycin and zosyn per pharm. In retrospect, this could also be more related to receiving percocet in ED.    2. Acute on chronic renal failure. Last cr in epic is 1.8 prior to admission. Improved mentation but with persistent acidemia pH 7.32 and some early signs of volume on chest xray and basilar rales. Foley placed, had 400 cc output. FENA is elevated >5, uncertain of patient compliance with home torsemide/ACEi but these could have incited an acute injury with dehydration. Urine sediment not entirely revealing with no protein/casts/RBCs. No hydronephrosis on CT abdomen, appearance is c/w insufficiency. Continue IVF at 125/hr with likely dehydration from prolonged diarrhea. Careful with respiratory status as patient maybe oliguric at this point. Hold home torsemide and home enalapril 20 mg daily. Plan to obtain records from [facility] with more recent renal function tests, and other possible exposures to ?dye or other. Follow up renal consult.   3. Acidosis. Mild metabolic with pH 1.61, now with corrected hyperglycemia this is primarily renal failure. Lactic acid and BMET pending.  Add bicarb to fluids today.   4. Insomnia. Home meds include restoril and xanax. Will hold in acute setting, may restart tomorrow to prevent withdrawal syndrome.   5. Diabetes with uncontrolled hyperglycemia. Likely from infection vs medication noncompliance. Insulin gtt successful with CBGs < 200 since midnight. Restart lantus 10 units (home dose 24 units) with sensitive SS in setting of renal failure.    6. HTN. At goal. Continue dilt. Hold home ACEi and torsemide.   7. Atrial fibrillation. Rate controlled. Continue diltiazem. Hold  xarelto with renal failure currently. Check INR. Consider heparin bridge, CHADs score 2.  8. Heal ulcers. Present since her previous hospitalization, patient states [facility]. Will have wound consult inpatient.   8. FEN/GI: NPO. Fluids changed to D5NS at 75/hr with addition of bicarb at 75/hr. F/u electrolytes q12 hr. zofran prn.   9. Prophylaxis: heparin sq, consider full bridge with borderline CHADs score.    10. Disposition: to telemetry. PT/OT ordered with weakness. Maybe prolonged course with severe renal failure.   11. Code Status: patient desires full code. Try to get outside records from [facility].     LOS: 1 day   [facility] PGY-3 02/01/2012, 6:44 AM

## 2012-02-01 NOTE — Progress Notes (Signed)
  Echocardiogram 2D Echocardiogram has been performed.  Tina [name] FRANCES 02/01/2012, 5:07 PM

## 2012-02-02 DIAGNOSIS — L89609 Pressure ulcer of unspecified heel, unspecified stage: Secondary | ICD-10-CM

## 2012-02-02 LAB — GLUCOSE, CAPILLARY
Glucose-Capillary: 107 mg/dL — ABNORMAL HIGH (ref 70–99)
Glucose-Capillary: 108 mg/dL — ABNORMAL HIGH (ref 70–99)
Glucose-Capillary: 130 mg/dL — ABNORMAL HIGH (ref 70–99)
Glucose-Capillary: 133 mg/dL — ABNORMAL HIGH (ref 70–99)
Glucose-Capillary: 140 mg/dL — ABNORMAL HIGH (ref 70–99)
Glucose-Capillary: 148 mg/dL — ABNORMAL HIGH (ref 70–99)

## 2012-02-02 LAB — IRON AND TIBC

## 2012-02-02 LAB — VITAMIN B12: Vitamin B-12: 276 pg/mL (ref 211–911)

## 2012-02-02 LAB — BASIC METABOLIC PANEL
BUN: 82 mg/dL — ABNORMAL HIGH (ref 6–23)
Calcium: 7.5 mg/dL — ABNORMAL LOW (ref 8.4–10.5)
GFR calc Af Amer: 10 mL/min — ABNORMAL LOW (ref >90)
GFR calc non Af Amer: 9 mL/min — ABNORMAL LOW (ref >90)
Glucose, Bld: 129 mg/dL — ABNORMAL HIGH (ref 70–99)
Potassium: 4.5 mEq/L (ref 3.5–5.1)

## 2012-02-02 LAB — URINE CULTURE

## 2012-02-02 LAB — FERRITIN: Ferritin: 217 ng/mL (ref 10–291)

## 2012-02-02 MED ORDER — ALPRAZOLAM 0.25 MG PO TABS
0.2500 mg | ORAL_TABLET | Freq: Three times a day (TID) | ORAL | Status: DC
  Administered 2012-02-02 – 2012-02-06 (×11): 0.25 mg via ORAL
  Filled 2012-02-02 (×11): qty 1

## 2012-02-02 MED ORDER — TEMAZEPAM 7.5 MG PO CAPS
7.5000 mg | ORAL_CAPSULE | Freq: Every day | ORAL | Status: DC
  Administered 2012-02-02: 7.5 mg via ORAL
  Filled 2012-02-02: qty 1

## 2012-02-02 MED ORDER — BIOTENE DRY MOUTH MT LIQD
15.0000 mL | Freq: Two times a day (BID) | OROMUCOSAL | Status: DC
  Administered 2012-02-02 – 2012-02-06 (×6): 15 mL via OROMUCOSAL

## 2012-02-02 MED ORDER — COLLAGENASE 250 UNIT/GM EX OINT
TOPICAL_OINTMENT | Freq: Every day | CUTANEOUS | Status: DC
  Administered 2012-02-02 – 2012-02-06 (×5): via TOPICAL
  Filled 2012-02-02: qty 30

## 2012-02-02 MED ORDER — SODIUM CHLORIDE 0.9 % IV SOLN
125.0000 mg | Freq: Every day | INTRAVENOUS | Status: DC
  Administered 2012-02-02 – 2012-02-04 (×3): 125 mg via INTRAVENOUS
  Filled 2012-02-02 (×8): qty 10

## 2012-02-02 MED ORDER — CALCIUM ACETATE 667 MG PO CAPS
667.0000 mg | ORAL_CAPSULE | Freq: Three times a day (TID) | ORAL | Status: DC
  Administered 2012-02-02 – 2012-02-04 (×7): 667 mg via ORAL
  Filled 2012-02-02 (×13): qty 1

## 2012-02-02 MED ORDER — SENNA 8.6 MG PO TABS
1.0000 | ORAL_TABLET | Freq: Every day | ORAL | Status: DC
  Administered 2012-02-02 – 2012-02-03 (×2): 8.6 mg via ORAL
  Filled 2012-02-02 (×5): qty 1

## 2012-02-02 NOTE — Progress Notes (Signed)
Family Practice Teaching Service  Pager [phone]  Subjective: Patient is doing well this morning and complains her legs and stomach hurts. She also is hungry. She can hold a conversation well and is oriented to her surroundings. Great improvement since last evaluated.  Objective: Vital signs in last 24 hours: Temp:  [97.6 F (36.4 C)-99.4 F (37.4 C)] 98.3 F (36.8 C) (09/23 0800) Pulse Rate:  [62-100] 89  (09/23 0800) Resp:  [13-21] 15  (09/23 0800) BP: (97-133)/(47-99) 103/61 mmHg (09/23 0800) SpO2:  [94 %-100 %] 95 % (09/23 0800) Weight:  [268 lb 15.4 oz (122 kg)] 268 lb 15.4 oz (122 kg) (09/23 0400) Weight change: 8 lb 2.5 oz (3.7 kg) Last BM Date: 01/31/12  Intake/Output from previous day: 09/22 0701 - 09/23 0700 In: 640 [I.V.:390; IV Piggyback:250] Out: 1400 [Urine:1400] Intake/Output this shift: Total I/O In: -  Out: 200 [Urine:200]  Physical Exam:  General: alert and appropriate today. Answers questions.  Cardiovascular: RRR, no murmur  Respiratory: nonlabored, CTAB.bibasilar rales today. No increased WOB Abdomen: obese, mild distention. Diffusely TTP, no distinct masses, no rebound or guarding, BS active  Extremities: 1+ pitting edema bilaterally. No redness noted. Has bilateral heal pressure ulcers with coban over Left, and small tape dressing over right. Skin: no ecchymosis noted Neuro: alert, oriented x 3. Normal speech.  Lab Results:  Basename 02/02/12 0500 02/01/12 0855  WBC 7.0 9.1  HGB 8.9* 9.2*  HCT 26.7* 27.8*  PLT 112* 152   BMET  Basename 02/02/12 0500 02/01/12 1705  NA 135 133*  K 4.5 4.0  CL 101 97  CO2 19 18*  GLUCOSE 129* 161*  BUN 82* 85*  CREATININE 4.66* 5.19*  CALCIUM 7.5* 7.4*   Studies/Results: Ct Abdomen Pelvis Wo Contrast 02/01/12 IMPRESSION:  1.  Approximately 4 mm stone within the superior aspect of the left ureter, just caudal to the left UPJ without associated upstream pelvocaliectasis or perinephric stranding. 2.   Unchanged 5 mm nonobstructing stone within the inferior pole of the left kidney. 3.  Moderate to large colonic stool burden without evidence of enteric obstruction. 4.  Cholelithiasis without definite evidence of cholecystitis.  5.  Cardiomegaly.  6.  Asymmetric left lower lobe air space opacities with bronchograms, possibly atelectasis, though worrisome for infection.   Original Report Authenticated By: Waynard Reeds, M.D.     Portable Chest 1 View  02/01/2012  *RADIOLOGY REPORT*  Clinical Data: Renal failure  PORTABLE CHEST - 1 VIEW  Comparison: 07/06/2011  Findings: Heart size is normal.  The lung volumes are low.  There is pulmonary vascular congestion.  Atelectasis is noted in the left base.  No airspace consolidation noted.  IMPRESSION:  1.  Low lung volumes and left base atelectasis. 2.  Pulmonary vascular congestion.   Original Report Authenticated By: Rosealee Albee, M.D.     Medications:  Scheduled:    . antiseptic oral rinse  15 mL Mouth Rinse BID  . ciprofloxacin  200 mg Intravenous Q24H  . citalopram  20 mg Oral Daily  . diltiazem  120 mg Oral Daily  . fluconazole  150 mg Oral Daily  . insulin aspart  0-9 Units Subcutaneous Q4H  . insulin glargine  10 Units Subcutaneous QHS  . metronidazole  500 mg Intravenous Q8H  . piperacillin-tazobactam (ZOSYN)  IV  2.25 g Intravenous Q6H  . sodium chloride  3 mL Intravenous Q12H  . vancomycin  2,000 mg Intravenous Once  . DISCONTD: diltiazem  240 mg  Oral Daily  . DISCONTD: insulin aspart  0-9 Units Subcutaneous TID WC  . DISCONTD: insulin regular  0-10 Units Intravenous TID WC  . DISCONTD: pregabalin  75 mg Oral BID   Continuous:    . dextrose 5 % and 0.45% NaCl 10 mL (02/01/12 1403)  . DISCONTD: sodium chloride 125 mL/hr at 01/31/12 1806  . DISCONTD: insulin (NOVOLIN-R) infusion Stopped (02/01/12 0645)  . DISCONTD:  sodium bicarbonate infusion 1000 mL 75 mL/hr at 02/01/12 1116    Assessment/Plan: Margene Cherian is a 71  y.o. year old female with a history of DM, CKD stage III, HTN, AF presenting with diarrhea/abdominal pain, weakness and found to have hyperglycemia, and acute on chronic kidney injury with mild acidosis.  1. Abdominal pain/diarrhea: -  CT: non-obstructing left renal stones not likely contributory. She does have a large stool burden.  - stool c diff: Pending - Caution with narcotics with resultant lethargy/confusion.  - DC'd antibiotics today. Diarrhea has not been an issue since admission.  2. Acute on chronic renal failure.  - Last cr in epic is 1.8 prior to admission; today 4.66 - Improved mentation  -  Foley placed -  FENA is elevated >5, uncertain of patient compliance with home torsemide/ACEi but these could have incited an acute injury with dehydration.   -  Plan to obtain records from [facility] with more recent renal function tests, and other possible exposures to ?dye or other.  - Follow up renal consult.__> dialysis 3. Acidosis. Mild metabolic with pH 1.61 on admission -->resolved  4. Insomnia.  - Restarted XanaxHome meds include restoril and xanax to prevent withdrawal syndrome. 5. Diabetes with uncontrolled hyperglycemia. Likely from infection vs medication noncompliance. -  Insulin gtt successful with CBGs < 200 since admission. -  Restart lantus 10 units (home dose 24 units) with sensitive SS in setting of renal failure.  6. HTN. At goal. Continue dilt. Hold home ACEi and torsemide. 7. Atrial fibrillation. Rate controlled. Continue diltiazem. Hold xarelto with renal failure currently. - Daily INR. Consider heparin bridge, CHADs score 2. 8. Heal ulcers. Present since her previous hospitalization, patient [facility]. Will continue wound care. 9. FEN/GI: NPO. Fluids changed to D5NS at 75/hr with addition of bicarb at 75/hr. F/u electrolytes q12 hr. zofran prn. 10. Prophylaxis: heparin sq, consider full bridge with  borderline CHADs score.  11. Disposition: to telemetry. PT/OT ordered with weakness. Maybe prolonged course with severe renal failure. 12. Code Status: patient desires full code. Try to get outside records from [facility].     LOS: 2 days   Felix Pacini PGY-1 02/02/2012, 8:28 AM

## 2012-02-02 NOTE — Progress Notes (Signed)
I have seen and examined this patient. I have discussed with Dr Rhae Lerner.  I agree with their findings and plans as documented in their progress note.  Acute Issues 1. Acute Renal Failure on Chronic Kidney Disease - Urine Culture no growth-final - Funguria (+) - On Enalapril at home.  - On torsemide at home - Hyperosmolar from hyperglycemia on admission -  FeNa = 5% -  Hypotensive - Working explanation of acute decline in renal function include prerenal secondary to hypovolemia from hyperglycemic diuresis in setting of ACEI (enalapril) and Loop diuretic  2. Ureteral Stone, left proximal ureter - ?Asymptomatic  3. Large Stool burden on abdominal CT - Suspect diarrhea complaint may have been paradoxical diarrhea - Great niece, Florina Ou, relates that patient has been laying in bed since discharge from recent [place] [facility] hospitalization, unable to get up for toileting using bedside commode nor clean herself after toileting.   4. Pressure Ulcers of Heels bilaterally - Uncertain stage  5. A-Fib: Was on rivaroxaban.  HR in 100-110  6.  Possible Cognitive Impairment - No deficit in attention, level of consciousness, organization of thought.  - Failed Mini-Cog screen: Failed Clock test, recalled only 1 out of 3 objects. - Will need formal MMSE with testing for reversible dementia causes if low MMSA - Pt has dependencies in transfers, toileting, bathing, dressing, continence per the patient's great niece who checks in on the patient frequently.  Pt lives alone. Pt asserts having independent capacity in all these ADLs. - There is concern for Self-Neglect - See pressure ulcers above and Wound Care nurse consultation evaluation.   -  Pt has refused SW to contact her family, but gave permission for the undersigned to contact the great niece who expresses that she and her mother do not think Ms Adolph can exist independently any longer.   Recommendations 1. Stop antibiotics 2. Wound  care nurse consultation 3.  Senokot stimulant laxative.  May add osmotic laxative if no response.  4.  MMSE 5. Psychiatry Service consult for evaluation of decision making capacity around disposition.  6. Volume management per Renal service.
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
## 2012-02-02 NOTE — Consult Note (Signed)
WOC consult Note Reason for Consult: Consult requested for bilat heel wounds.Pt states she has had these for "at least a month." Wound type: Left heel 50% dark purple deep tissue injury/50% yellow slough unstageable wound. Right heel 20% dark purple deep tissue injury/89% yellow slough unstageable wound. Both sites with mod odor,mod yellow drainage.   Sacrum with dark purple deep tissue injury in the outline of a toilet seat or bedpan, affected area approx 20X1cm in circular shape. Pressure Ulcer POA: Yes Dressing procedure/placement/frequency: Pt transferred from another facility yesterday.  "I had to lie there along time."  Denies having any help at home for wound care to heels and has not been using dressings, she states.  Plan:  Deep tissue injury is high risk to evolve into full thickness wound.  Air mattress to reduce pressure, position off affected area.  Float heels.  Santyl for chemical debridement of non-viable tissue to Bilat heels. Pt could benefit from home health assistance after discharge for dressing changes.  Cammie Mcgee, RN, MSN, [employer]  [phone]

## 2012-02-02 NOTE — Progress Notes (Signed)
[facility] Progress Note    Subjective:   No acute events overnight, patient still complaining of abdominal pain in her right lower quadrand, and wants to drink and eat and patient was held without fluids per renal yesterday since she appeared volume overloaded and help NPO by primary team. She denies any diarrhea since admission and no bowel movements recorded, so highly unlikely that she has C. Diff.   Echo showed EF 60-65%   Objective:   BP 116/99  Pulse 89  Temp 98.1 F (36.7 C) (Oral)  Resp 15  Ht 5\' 5"  (1.651 m)  Wt 268 lb 15.4 oz (122 kg)  BMI 44.76 kg/m2  SpO2 95%  Intake/Output Summary (Last 24 hours) at 02/02/12 0803 Last data filed at 02/02/12 0435  Gross per 24 hour  Intake    515 ml  Output   1400 ml  Net   -885 ml   Since admission: + 704  Temp:  [97.6 F (36.4 C)-99.4 F (37.4 C)] 98.1 F (36.7 C) (09/23 0400) Pulse Rate:  [62-100] 89  (09/23 0600) Resp:  [13-21] 15  (09/23 0600) BP: (97-133)/(47-99) 116/99 mmHg (09/23 0600) SpO2:  [94 %-100 %] 95 % (09/23 0600) Weight:  [268 lb 15.4 oz (122 kg)] 268 lb 15.4 oz (122 kg) (09/23 0400)   Weight change: 8 lb 2.5 oz (3.7 kg)  Physical Exam: AVW:[id number] WF, mild discomfort, diaphoretic, non distressed, obese CVS: RRR, 2/6 systolic murmur  Resp: Lungs CTA-B, no rales Abd: protuberant, hypoactive bowel sounds, non tender to palpation  Ext: 2+ pitting edema of foot and pre-tibia, very tender to palpation, bilateral heel ulcers  Imaging: Ct Abdomen Pelvis Wo Contrast  02/01/2012  *RADIOLOGY REPORT*  Clinical Data: Diffuse abdominal pain, diarrhea, renal failure  CT ABDOMEN AND PELVIS WITHOUT CONTRAST  Technique:  Multidetector CT imaging of the abdomen and pelvis was performed following the standard protocol without intravenous contrast.  Comparison: CT abdomen pelvis - 04/10/2009;  Findings: The lack of intravenous contrast limits the ability to evaluate solid abdominal organs. Examination is  further degraded secondary to respiratory artifact.  Normal hepatic contour.  High attenuation material within an otherwise normal-appearing gallbladder favored to represent layering stones or sludge.  No definite gallbladder wall thickening or pericholecystic fluid on this noncontrast examination.  No ascites.  There is an approximately 4 mm stone within the superior aspect of the left ureter, immediately caudal to the left UPJ (coronal image 58, series 401).  This finding is without associated upstream pelvicaliectasis.  No additional stones are seen within the left ureter.  Incidental note is made of a phlebolith adjacent to the distal aspect of the left ureter (image 68, series 2).  There is an approximately 5 mm nonobstructing stone within the inferior pole of the left kidney (coronal image 63, series 401), unchanged.  No right-sided renal stones.  No perinephric stranding.  There is mild bilateral renal cortical thinning compatible with history of renal insufficiency.  Normal noncontrast appearance of the bilateral adrenal glands, pancreas and spleen.  Moderate to large colonic stool barium without evidence of obstruction.  The appendix is not visualized, however there is no inflammatory change within the right lower abdominal quadrant.  A minimal amount of ingested enteric contrast extends to the level of the distal small bowel.  No pneumoperitoneum, pneumatosis or portal venous gas.  Scattered minimal atherosclerotic calcifications within a normal caliber abdominal aorta.  Scattered shoddy porta hepatis, retroperitoneal,, pelvic and inguinal lymph nodes are not enlarged  by CT criteria and pursue to the reactive in etiology.  Post hysterectomy.  No discrete adnexal mass.  No free fluid in the pelvis.  Limited visualization of the lower thorax demonstrates asymmetric heterogeneous air space opacities within the imaged left lower lung with air bronchograms (image 7).  Trace bilateral pleural effusions.   Cardiomegaly.  Calcifications of the mitral valve annulus.  There is mild pericardial fluid, presumably physiologic.  No acute or aggressive osseous abnormalities.  Lumbar spine degenerative change. Tiny mesenteric fat containing periumbilical hernia.  IMPRESSION:  1.  Approximately 4 mm stone within the superior aspect of the left ureter, just caudal to the left UPJ without associated upstream pelvocaliectasis or perinephric stranding. 2.  Unchanged 5 mm nonobstructing stone within the inferior pole of the left kidney. 3.  Moderate to large colonic stool burden without evidence of enteric obstruction. 4.  Cholelithiasis without definite evidence of cholecystitis.  5.  Cardiomegaly.  6.  Asymmetric left lower lobe air space opacities with bronchograms, possibly atelectasis, though worrisome for infection.   Original Report Authenticated By: Waynard Reeds, M.D.    Portable Chest 1 View  02/01/2012  *RADIOLOGY REPORT*  Clinical Data: Renal failure  PORTABLE CHEST - 1 VIEW  Comparison: 07/06/2011  Findings: Heart size is normal.  The lung volumes are low.  There is pulmonary vascular congestion.  Atelectasis is noted in the left base.  No airspace consolidation noted.  IMPRESSION:  1.  Low lung volumes and left base atelectasis. 2.  Pulmonary vascular congestion.   Original Report Authenticated By: Rosealee Albee, M.D.     Labs: BMET  Lab 02/02/12 0500 02/01/12 1705 02/01/12 0855 02/01/12 0014 01/31/12 1733  NA 135 133* 134* 135 131*  K 4.5 4.0 3.9 3.8 4.3  CL 101 97 96 97 92*  CO2 19 18* 18* 20 17*  GLUCOSE 129* 161* 131* 173* 474*  BUN 82* 85* 89* 92* 92*  CREATININE 4.66* 5.19* 5.40* 5.73* 5.54*  ALB -- -- -- -- --  CALCIUM 7.5* 7.4* 7.1* 7.5* 7.8*  PHOS 7.0* -- -- 8.2* --   CBC  Lab 02/02/12 0500 02/01/12 0855 01/31/12 1733  WBC 7.0 9.1 10.9*  NEUTROABS -- -- 8.9*  HGB 8.9* 9.2* 10.8*  HCT 26.7* 27.8* 31.1*  MCV 81.2 82.0 80.2  PLT 112* 152 153    Medications:      .  antiseptic oral rinse  15 mL Mouth Rinse BID  . ciprofloxacin  200 mg Intravenous Q24H  . citalopram  20 mg Oral Daily  . diltiazem  120 mg Oral Daily  . fluconazole  150 mg Oral Daily  . insulin aspart  0-9 Units Subcutaneous Q4H  . insulin glargine  10 Units Subcutaneous QHS  . metronidazole  500 mg Intravenous Q8H  . piperacillin-tazobactam (ZOSYN)  IV  2.25 g Intravenous Q6H  . sodium chloride  3 mL Intravenous Q12H  . vancomycin  2,000 mg Intravenous Once  . DISCONTD: diltiazem  240 mg Oral Daily  . DISCONTD: insulin aspart  0-9 Units Subcutaneous TID WC  . DISCONTD: insulin regular  0-10 Units Intravenous TID WC  . DISCONTD: pregabalin  75 mg Oral BID     Assessment/ Plan:   Pt is a 71 y.o. yo female with CKD stage III, insulin dependent DM, and A fib who presented with an acute kidney injury, acidosis, hyperglycemia, and profuse diarrhea.   Acute Kidney Injury: The patient's creatinine was 1.8 when measured as an outpatient  in March. It was then 2.24 on 01/12/12 and [facility] and increased to 5.5 upon admission to [facility]. Her BUN at admission was also elevated to 93 from 22 on 01/12/12. The patient's mechanism of injury is unclear as she has a history consistent with  GI losses and decreased PO intake which would cause a pre-renal injury. However, the patient does not appear clinically dehydrated, and her weight was approximately 260 pounds, up 12 pounds from March. Her FeNA was > 5, which is also concerning for an intrinsic renal process such as ATN, but may be skewed if she was taking her home torsemide. The patient does have evidence of a possible UTI on urinalysis with the presence of gram positive rods and yeast, which could cause kidney injury and hyperglycemia. This would not explain the diarrhea however. CT scan ruled out post-renal  - Creat improved o/n to 4.66 and BUN to 82 - Volume status still appears net positive so I wouldn't give IVF, but would allow to advance diet  since GI symptoms resolved and patient needs protein to improve nutrition and swelling  - BP still ran low overnight with 90s/50s, so continue to hold benazepril and torsemide; agree with holding diltiazem if SBP < 120  - Hyperphosphatemia and hypocalcemia - Likely secondary to CKD, consider starting Phoslo or TUMS for phosphorous binding  - Strict I/O and daily renal panel   Acidosis, Metabolic: pH 7.32  - appears resolved 9/23  ID: Yeast and Gram positive rods on gram stain; culture pending; s/p Vanc, still on Zosyn, Cipro, Flagyl  - Continue with Zosyn for possible infection, but also consider as a possible contaminant from stool  - Diflucan for 3 days to treat possible yeast infection, started 9/22, last day 9/24 - Follow up urine culture  - C. Diff PCR not performed and very likely patient does not have C. Diff since no diarrhea   Altered Mental Status: Improved from 9/22, Lyrica held 9/22 - continue to hold Lyrica until renal function improved   Anemia, Normocytic: Baseline from Feb 2013 appears to be 11.8; It was 9.2 on admission. This could be resultant from GI loss or chronic kidney disease. Fe not calculable at < 10.  - Give IV iron today - Nulecit 125 mg    DVT PPX: SCD's   Thank you for this interesting consultation.    Si Raider Clinton Sawyer, MD 02/02/2012, 8:03 AM   I have seen and examined this patient and agree with the plan of care seen and eval.  Cr a little better, still low gfr.  Vol xs, mild acidemia.  Allow to diurese, follow bicarb, tx ^ phos. .  Desyre Calma L 02/02/2012, 10:20 AM

## 2012-02-02 NOTE — Progress Notes (Signed)
I performed a mini mental status exam on the patient and she scored a 22/30.  She lost 2 points on orientation, 3 points on registration, and 3 points on recall.   Kevin Fenton MD, [phone], 02/02/2012

## 2012-02-02 NOTE — Evaluation (Signed)
Occupational Therapy Evaluation Patient Details Name: Tina [name] MRN: [id number] DOB: 1940-09-25 Today's Date: 02/02/2012 Time: 4098-1191 OT Time Calculation (min): 24 min  OT Assessment / Plan / Recommendation Clinical Impression  This 71 yo female admitted with diarrhea and weakness presents to acute OT with the problems below. Will benefit from acute OT with follow up at [facility]    OT Assessment  Patient needs continued OT Services    Follow Up Recommendations  Skilled nursing facility    Barriers to Discharge Decreased caregiver support    Equipment Recommendations  Defer to next venue       Frequency  Min 2X/week    Precautions / Restrictions Precautions Precautions: Fall Restrictions Weight Bearing Restrictions: No       ADL  Eating/Feeding: Simulated;Set up;Supervision/safety Where Assessed - Eating/Feeding: Bed level Grooming: Simulated;Minimal assistance Where Assessed - Grooming: Supine, head of bed up Upper Body Bathing: Simulated;Moderate assistance Where Assessed - Upper Body Bathing: Supine, head of bed up Lower Body Bathing: Simulated;+1 Total assistance Where Assessed - Lower Body Bathing: Supine, head of bed up;Supine, head of bed flat;Rolling right and/or left Upper Body Dressing: Simulated;+1 Total assistance Where Assessed - Upper Body Dressing: Supine, head of bed up Lower Body Dressing: Simulated;+1 Total assistance Where Assessed - Lower Body Dressing: Supine, head of bed up;Supine, head of bed flat;Rolling right and/or left    OT Diagnosis: Generalized weakness;Acute pain;Cognitive deficits  OT Problem List: Decreased strength;Decreased range of motion;Decreased activity tolerance;Impaired balance (sitting and/or standing);Decreased knowledge of use of DME or AE;Obesity;Impaired UE functional use OT Treatment Interventions: Self-care/ADL training;Therapeutic activities;Patient/family education;Balance training;DME and/or AE instruction   OT  Goals Acute Rehab OT Goals OT Goal Formulation: With patient Time For Goal Achievement: 02/16/12 Potential to Achieve Goals: Good ADL Goals Pt Will Perform Grooming: with set-up;with supervision;Unsupported;Sitting, edge of bed ADL Goal: Grooming - Progress: Goal set today Pt Will Transfer to Toilet: with mod assist;Stand pivot transfer;with DME;Extra wide 3-in-1 ADL Goal: Toilet Transfer - Progress: Goal set today Miscellaneous OT Goals Miscellaneous OT Goal #1: Pt will be able to roll in bed left and right to A with BADLs with Mod A. OT Goal: Miscellaneous Goal #1 - Progress: Goal set today Miscellaneous OT Goal #2: Pt will be able to come up to sit EOB with Mod A to A with BADLs and transfers OT Goal: Miscellaneous Goal #2 - Progress: Goal set today  Visit Information  Last OT Received On: 02/02/12 Assistance Needed: +2    Subjective Data  Subjective: I am not having therapy at home Patient Stated Goal: I want to go home   Prior Functioning    Home Living Lives With: Alone Available Help at Discharge: Family;Neighbor;Personal care attendant [facility] M,W,F for bathing) Type of Home: House Home Access: Stairs to enter [employer] of Steps: 2 Entrance Stairs-Rails: Can reach both Home Layout: One level Bathroom Shower/Tub: Tub/shower unit (takes sponge baths) Bathroom Toilet: Standard Home Adaptive Equipment: Walker - rolling;Shower chair with back Prior Function Level of Independence: Independent with assistive device(s) Able to Take Stairs?: Yes Driving: No Vocation: Retired Musician: No difficulties Dominant Hand: Right      Cognition  Overall Cognitive Status: Impaired Area of Impairment: Awareness of deficits Arousal/Alertness: Awake/alert Behavior During Session: [facility] for tasks performed Following Commands: Follows one step commands consistently Awareness of Deficits: Wants to go home, but cannot even roll herself over in bed      Extremity/Trunk Assessment Right Upper Extremity Assessment RUE ROM/Strength/Tone: Deficits RUE  ROM/Strength/Tone Deficits: edema Left Upper Extremity Assessment LUE ROM/Strength/Tone: Deficits LUE ROM/Strength/Tone Deficits: edema   Mobility    Bed Mobility Bed Mobility: Rolling Right;Rolling Left Rolling Right: 2: Max assist;With rail Rolling Left: 2: Max assist;With rail             End of Session OT - End of Session Activity Tolerance: Patient limited by fatigue Patient left: in bed;with call bell/phone within reach       Evette Georges [phone] 02/02/2012, 4:25 PM

## 2012-02-02 NOTE — Evaluation (Signed)
Physical Therapy Evaluation Patient Details Name: Tina [name] MRN: [id number] DOB: [birth year] Today's Date: 02/02/2012 Time: 8657-8469 PT Time Calculation (min): 24 min  PT Assessment / Plan / Recommendation Clinical Impression  Patient s/p afib  and sepsis with decr mobility secondary to deconditioning due to hospital stay.  Will benefit from short term SNF prior to d/c home to maximize funciton.      PT Assessment  Patient needs continued PT services    Follow Up Recommendations  Skilled nursing facility;Supervision/Assistance - 24 hour    Barriers to Discharge Decreased caregiver support      Equipment Recommendations  3 in 1 bedside comode    Recommendations for Other Services     Frequency Min 3X/week    Precautions / Restrictions Precautions Precautions: Fall Restrictions Weight Bearing Restrictions: No   Pertinent Vitals/Pain VSS, No pain      Mobility  Bed Mobility Bed Mobility: Rolling Right;Right Sidelying to Sit;Sitting - Scoot to Edge of Bed Rolling Right: 3: Mod assist;With rail Right Sidelying to Sit: 3: Mod assist;With rails;HOB flat Sitting - Scoot to Edge of Bed: 3: Mod assist;With rail Details for Bed Mobility Assistance: cues for sequencing movement.   Transfers Transfers: Sit to Stand;Stand to Sit;Stand Pivot Transfers Sit to Stand: 3: Mod assist;From elevated surface;With upper extremity assist;From bed Stand to Sit: 3: Mod assist;With upper extremity assist;To chair/3-in-1;With armrests Stand Pivot Transfers: 3: Mod assist Details for Transfer Assistance: Patient needed cues for hand placement with transfers.  Needed cues to sequence movement as well.  Patient assisted with weight shifting to stand and pivot to chair.  Patient maintained slightly flexed posture evne thouth cued to stand upright.  Ambulation/Gait Ambulation/Gait Assistance: Not tested (comment) Stairs: No Wheelchair Mobility Wheelchair Mobility: No         PT  Diagnosis: Difficulty walking  PT Problem List: Decreased activity tolerance;Decreased mobility;Decreased knowledge of use of DME;Decreased safety awareness;Decreased knowledge of precautions PT Treatment Interventions: DME instruction;Gait training;Functional mobility training;Therapeutic activities;Therapeutic exercise;Balance training;Patient/family education;Cognitive remediation   PT Goals Acute Rehab PT Goals PT Goal Formulation: With patient Time For Goal Achievement: 02/16/12 Potential to Achieve Goals: Good Pt will go Supine/Side to Sit: Independently PT Goal: Supine/Side to Sit - Progress: Goal set today Pt will go Sit to Stand: Independently PT Goal: Sit to Stand - Progress: Goal set today Pt will Transfer Bed to Chair/Chair to Bed: with modified independence PT Transfer Goal: Bed to Chair/Chair to Bed - Progress: Goal set today Pt will Ambulate: 16 - 50 feet;with supervision;with least restrictive assistive device PT Goal: Ambulate - Progress: Goal set today  Visit Information  Last PT Received On: 02/02/12 Assistance Needed: +2    Subjective Data  Subjective: "I am so weak." Patient Stated Goal: To go home   Prior Functioning  Home Living Lives With: Alone Available Help at Discharge: Family;Neighbor;Personal care attendant (Has aide M,W,F for bathing) Type of Home: House Home Access: Stairs to enter [employer] of Steps: 2 Entrance Stairs-Rails: Can reach both Home Layout: One level Bathroom Shower/Tub: Engineer, manufacturing systems: Standard Home Adaptive Equipment: Walker - rolling;Shower chair with back Prior Function Level of Independence: Independent with assistive device(s) Able to Take Stairs?: Yes Driving: No Vocation: Retired Musician: No difficulties    Cognition  Overall Cognitive Status: Impaired Area of Impairment: Following commands;Safety/judgement Arousal/Alertness: Awake/alert Orientation Level: Appears  intact for tasks assessed Behavior During Session: [facility] for tasks performed Following Commands: Follows one step commands with increased time Safety/Judgement: Decreased  awareness of safety precautions;Decreased safety judgement for tasks assessed    Extremity/Trunk Assessment Right Upper Extremity Assessment RUE ROM/Strength/Tone: [facility] for tasks assessed Left Upper Extremity Assessment LUE ROM/Strength/Tone: [facility] for tasks assessed Right Lower Extremity Assessment RLE ROM/Strength/Tone: [facility] for tasks assessed Left Lower Extremity Assessment LLE ROM/Strength/Tone: [facility] for tasks assessed   Balance Static Sitting Balance Static Sitting - Balance Support: Bilateral upper extremity supported;Feet supported Static Sitting - Level of Assistance: 5: Stand by assistance Static Sitting - Comment/# of Minutes: 2 minutes   End of Session PT - End of Session Equipment Utilized During Treatment: Gait belt;Oxygen Activity Tolerance: Patient tolerated treatment well Patient left: in chair;with call bell/phone within reach Nurse Communication: Mobility status       INGOLD,Halo Laski 02/02/2012, 10:37 AM  [facility] [phone] (pager)

## 2012-02-02 NOTE — Progress Notes (Signed)
Pt's nieceCarla Drape) wanted to talk to the social worker.

## 2012-02-03 LAB — GLUCOSE, CAPILLARY
Glucose-Capillary: 166 mg/dL — ABNORMAL HIGH (ref 70–99)
Glucose-Capillary: 144 mg/dL — ABNORMAL HIGH (ref 70–99)
Glucose-Capillary: 135 mg/dL — ABNORMAL HIGH (ref 70–99)
Glucose-Capillary: 206 mg/dL — ABNORMAL HIGH (ref 70–99)
Glucose-Capillary: 279 mg/dL — ABNORMAL HIGH (ref 70–99)

## 2012-02-03 LAB — BASIC METABOLIC PANEL
CO2: 22 mEq/L (ref 19–32)
Calcium: 8.2 mg/dL — ABNORMAL LOW (ref 8.4–10.5)
Chloride: 104 mEq/L (ref 96–112)
Creatinine, Ser: 4.54 mg/dL — ABNORMAL HIGH (ref 0.50–1.10)
Glucose, Bld: 157 mg/dL — ABNORMAL HIGH (ref 70–99)

## 2012-02-03 MED ORDER — POLYETHYLENE GLYCOL 3350 17 G PO PACK
17.0000 g | PACK | Freq: Every day | ORAL | Status: DC
  Filled 2012-02-03 (×4): qty 1

## 2012-02-03 MED ORDER — ASPIRIN 81 MG PO CHEW
81.0000 mg | CHEWABLE_TABLET | Freq: Every day | ORAL | Status: DC
  Administered 2012-02-03 – 2012-02-06 (×4): 81 mg via ORAL
  Filled 2012-02-03 (×4): qty 1

## 2012-02-03 NOTE — Progress Notes (Signed)
[facility] Progress Note    Subjective:   No acute events overnight, minimal PO intake, no BM, still confused but awake this morning, no complaint except pain in her legs    Objective:   BP 125/69  Pulse 93  Temp 97.6 F (36.4 C) (Oral)  Resp 15  Ht 5\' 5"  (1.651 m)  Wt 266 lb 1.5 oz (120.7 kg)  BMI 44.28 kg/m2  SpO2 98%  Intake/Output Summary (Last 24 hours) at 02/03/12 0943 Last data filed at 02/03/12 0759  Gross per 24 hour  Intake    380 ml  Output   1350 ml  Net   -970 ml   Since admission: -85  Temp:  [97.5 F (36.4 C)-98.5 F (36.9 C)] 97.6 F (36.4 C) (09/24 0759) Pulse Rate:  [83-136] 93  (09/24 0759) Resp:  [11-28] 15  (09/24 0759) BP: (86-125)/(41-70) 125/69 mmHg (09/24 0759) SpO2:  [92 %-99 %] 98 % (09/24 0759) Weight:  [264 lb 15.9 oz (120.2 kg)-266 lb 1.5 oz (120.7 kg)] 266 lb 1.5 oz (120.7 kg) (09/24 0500)   Weight change: -3 lb 15.5 oz (-1.8 kg)  Physical Exam: ZOX:[id number] WF, mild discomfort, diaphoretic, non distressed, obese CVS: RRR, 2/6 systolic murmur  Resp: Lungs CTA-B, no rales Abd: protuberant, distended, hypoactive bowel sounds, non tender to palpation  Ext: 2+ pitting edema of foot and pre-tibia, very tender to palpation, bilateral heel ulcers Back: pre-sacral edema   Imaging: No results found.  Labs: BMET  Lab 02/03/12 0643 02/02/12 0500 02/01/12 1705 02/01/12 0855 02/01/12 0014 01/31/12 1733  NA 140 135 133* 134* 135 131*  K 4.6 4.5 4.0 3.9 3.8 4.3  CL 104 101 97 96 97 92*  CO2 22 19 18* 18* 20 17*  GLUCOSE 157* 129* 161* 131* 173* 474*  BUN 80* 82* 85* 89* 92* 92*  CREATININE 4.54* 4.66* 5.19* 5.40* 5.73* 5.54*  ALB -- -- -- -- -- --  CALCIUM 8.2* 7.5* 7.4* 7.1* 7.5* 7.8*  PHOS -- 7.0* -- -- 8.2* --   CBC  Lab 02/02/12 0500 02/01/12 0855 01/31/12 1733  WBC 7.0 9.1 10.9*  NEUTROABS -- -- 8.9*  HGB 8.9* 9.2* 10.8*  HCT 26.7* 27.8* 31.1*  MCV 81.2 82.0 80.2  PLT 112* 152 153    Medications:       . ALPRAZolam  0.25 mg Oral TID  . antiseptic oral rinse  15 mL Mouth Rinse BID  . calcium acetate  667 mg Oral TID WC  . citalopram  20 mg Oral Daily  . collagenase   Topical Daily  . diltiazem  120 mg Oral Daily  . ferric gluconate (FERRLECIT/NULECIT) IV  125 mg Intravenous Daily  . fluconazole  150 mg Oral Daily  . insulin aspart  0-9 Units Subcutaneous Q4H  . insulin glargine  10 Units Subcutaneous QHS  . senna  1 tablet Oral Daily  . sodium chloride  3 mL Intravenous Q12H  . temazepam  7.5 mg Oral QHS  . DISCONTD: ciprofloxacin  200 mg Intravenous Q24H  . DISCONTD: metronidazole  500 mg Intravenous Q8H  . DISCONTD: piperacillin-tazobactam (ZOSYN)  IV  2.25 g Intravenous Q6H     Assessment/ Plan:   Pt is a 71 y.o. yo female with CKD stage III, insulin dependent DM, and A fib who presented with an acute kidney injury, acidosis, hyperglycemia, and profuse diarrhea.   Acute Kidney Injury: The patient's creatinine was 1.8 when measured as an outpatient in March. It was  then 2.24 on 01/12/12 and [facility] and increased to 5.5 upon admission to [facility]. Her BUN at admission was also elevated to 93 from 22 on 01/12/12. The patient's mechanism of injury is unclear as she has a history consistent with  GI losses and decreased PO intake which would cause a pre-renal injury. However, the patient does not appear clinically dehydrated, and her weight was approximately 260 pounds, up 12 pounds from March. Her FeNA was > 5, which is also concerning for an intrinsic renal process such as ATN, but may be skewed if she was taking her home torsemide. The patient does have evidence of a possible UTI on urinalysis with the presence of gram positive rods and yeast, which could cause kidney injury and hyperglycemia. This would not explain the diarrhea however. CT scan ruled out post-renal  - Creat stable, this may resolve, but could take several weeks  - Volume status still appears net positive, so no IVF   - BP still ran low overnight with 90s/50s, so continue to hold benazepril and torsemide - Don't restart ACE-I unless the patient is discharged to SNF or LTAC as this is likely going to cause more renal problems in the future  - Strict I/O and daily renal panel   Hyperphosphatemia and hypocalcemia: Likely secondary to CKD, started phosphate binder on 9/23 - check renal panel in AM tomorrow to reassess phos  Acidosis, Metabolic: pH 7.32  - appears resolved 9/23  Stool Burden: Patient originally thought to have c diff so started on cipro/flagyl, but no stools since so likely secondary to constipation - Agree with aggressive measures to move bowels and this may improve PO intake   Altered Mental Status: Improved from 9/22, Lyrica held 9/22; MMSE 22/30 - continue to hold Lyrica until renal function improved  - management per FMTS   Anemia, Normocytic: Baseline from Feb 2013 appears to be 11.8; It was 9.2 on admission. This could be resultant from GI loss or chronic kidney disease. Fe not calculable at < 10.  - Give IV iron today - Nulecit 125 mg day 2/10   DVT PPX: SCD's    Thank you for this interesting consultation.    Si Raider Clinton Sawyer, MD 02/03/2012, 9:43 AM   I have seen and examined this patient and agree with the plan of care seen and eval.  Cr a little better, still low gfr.  Vol xs, mild acidemia.  Allow to diurese, follow bicarb, tx ^ phos. Clinton Sawyer, EDWARD 02/03/2012, 9:43 AM I have seen and examined this patient and agree with the plan of care Will keep vol even, has xs at this time and allow to equilibrate, hopefully will turn around GFR .  Indi Willhite L 02/03/2012, 12:16 PM

## 2012-02-03 NOTE — Progress Notes (Signed)
Family Practice Teaching Service  Pager [phone]  Subjective: Patient is her room complaining she has not received her breakfast yet. She thought it was noon and did not understand why her breakfast never came to her room. With explanation she realizes it was only 8:30. She definitely has moments of clarity, but she gets confused as well. She has no other complaints this morning. She has not had A BM since she has been admitted.   Objective: Vital signs in last 24 hours: Temp:  [97.5 F (36.4 C)-98.5 F (36.9 C)] 97.6 F (36.4 C) (09/24 0759) Pulse Rate:  [83-136] 93  (09/24 0759) Resp:  [11-28] 15  (09/24 0759) BP: (86-125)/(41-70) 125/69 mmHg (09/24 0759) SpO2:  [92 %-99 %] 98 % (09/24 0759) Weight:  [264 lb 15.9 oz (120.2 kg)-266 lb 1.5 oz (120.7 kg)] 266 lb 1.5 oz (120.7 kg) (09/24 0500) Weight change: -3 lb 15.5 oz (-1.8 kg) Last BM Date: 01/31/12   Intake/Output Summary (Last 24 hours) at 02/03/12 0803 Last data filed at 02/03/12 0759  Gross per 24 hour  Intake    390 ml  Output   1350 ml  Net   -960 ml   Weight change: -3 lb 15.5 oz (-1.8 kg)  Physical Exam:  General: alert and appropriate today. Answers questions.  Cardiovascular: RRR, no murmur  Respiratory: nonlabored, CTAB.bibasilar rales today. No increased WOB Abdomen: obese, mild distention. Diffusely TTP, no distinct masses, no rebound or guarding, BS active  Extremities: 1+ pitting edema bilaterally- improving.  Skin: no ecchymosis noted. Bilateral heals bandaged today with an occlusive dressing. Large Breakdown on her sacrum (per report). Neuro: alert, oriented x 3. Normal speech.  Lab Results:  Basename 02/02/12 0500 02/01/12 0855  WBC 7.0 9.1  HGB 8.9* 9.2*  HCT 26.7* 27.8*  PLT 112* 152   BMET  Basename 02/03/12 0643 02/02/12 0500  NA 140 135  K 4.6 4.5  CL 104 101  CO2 22 19  GLUCOSE 157* 129*  BUN 80* 82*  CREATININE 4.54* 4.66*  CALCIUM 8.2* 7.5*    Medications:  Scheduled:    .  ALPRAZolam  0.25 mg Oral TID  . antiseptic oral rinse  15 mL Mouth Rinse BID  . calcium acetate  667 mg Oral TID WC  . citalopram  20 mg Oral Daily  . collagenase   Topical Daily  . diltiazem  120 mg Oral Daily  . ferric gluconate (FERRLECIT/NULECIT) IV  125 mg Intravenous Daily  . fluconazole  150 mg Oral Daily  . insulin aspart  0-9 Units Subcutaneous Q4H  . insulin glargine  10 Units Subcutaneous QHS  . senna  1 tablet Oral Daily  . sodium chloride  3 mL Intravenous Q12H  . temazepam  7.5 mg Oral QHS  . DISCONTD: ciprofloxacin  200 mg Intravenous Q24H  . DISCONTD: metronidazole  500 mg Intravenous Q8H  . DISCONTD: piperacillin-tazobactam (ZOSYN)  IV  2.25 g Intravenous Q6H   Continuous:    . dextrose 5 % and 0.45% NaCl 10 mL (02/01/12 1403)    Assessment/Plan: Ireene Ballowe is a 71 y.o. year old female with a history of DM, CKD stage III, HTN, AF presenting with diarrhea/abdominal pain, weakness and found to have hyperglycemia, and acute on chronic kidney injury with mild acidosis.   1. Abdominal pain/diarrhea: -  CT: non-obstructing left renal stones not likely contributory. She does have a large stool burden.  - stool c diff: Never collected - Caution with narcotics with resultant  lethargy/confusion.  - DC'd antibiotics today. Diarrhea has not been an issue since admission.  - Added Senna Yesterday. Will likely add Miralax today.  2. Acute on chronic renal failure.  - Last cr in epic is 1.8 prior to admission;5.19-->  4.66--> 4.54 today - Improved mentation  - Foley placed -  FENA is elevated >5, uncertain of patient compliance with home torsemide/ACEi but these could have incited an acute injury with dehydration.   -  Plan to obtain records from [facility] with more recent renal function tests, and other possible exposures to ?dye or other.  - Follow up renal consult.__> dialysis  3. Acidosis. Mild metabolic with pH 1.61 on admission -->resolved   4.  Insomnia.  - Restarted Xanax home med to prevent withdraw  5. Diabetes with uncontrolled hyperglycemia. Likely from infection vs medication noncompliance. -  Insulin gtt successful with CBGs < 200 since admission. -  Restart lantus 10 units (home dose 24 units) with sensitive SS in setting of renal failure.   6. HTN. At goal. Continue dilt. Hold home ACEi and torsemide.  7. Atrial fibrillation. Rate controlled. Continue diltiazem. Hold xarelto with renal failure currently. - Daily INR. Consider heparin bridge, CHADs score 2.  8. Heal ulcers. Present since her previous hospitalization, patient states she was [facility] in last 2 weeks. Will continue wound care.  9. FEN/GI: NPO. Fluids changed to D5NS at 75/hr with addition of bicarb at 75/hr. F/u electrolytes q12 hr. zofran prn.  10. Prophylaxis: heparin sq, consider full bridge with borderline CHADs score.   11. Disposition: transfer to med-surg with telemetry. PT/OT ordered with weakness. Maybe prolonged course with severe renal failure.  12. Code Status: patient desires full code. Try to get outside records from [facility]     LOS: 3 days   Felix Pacini PGY-1 02/03/2012, 8:01 AM

## 2012-02-03 NOTE — Progress Notes (Signed)
I discussed with Dr Claiborne Billings.  I agree with their plans documented in their progress note for today. Please see Dr Deirdre Priest' progress note today for attending note.

## 2012-02-03 NOTE — Progress Notes (Signed)
I have seen and examined this patient. I have discussed with Dr Claiborne Billings.  I agree with their findings and plans as documented in their progress note.  See my brief progress note for my additions.

## 2012-02-03 NOTE — Progress Notes (Signed)
Stool specimen to lab for cdiff. Report called to unit 3000.

## 2012-02-03 NOTE — Progress Notes (Signed)
Foam dressing changed to heels and sacral region.

## 2012-02-03 NOTE — Progress Notes (Addendum)
Clinical Social Work Progress Note PSYCHIATRY SERVICE LINE 02/03/2012  Patient:  Tina [name]  Account:  [id number]  Admit Date:  01/31/2012  Clinical Social Worker:  Ashley Jacobs, LCSW  Date/Time:  02/03/2012 10:28 AM  Review of Patient  Overall Medical Condition:   CSW received consult from Psych list regarding capacity.  Patient seen this am in bed where she was sleeping. Easily awoke to CSW voice and participated in assessment.  Educated patient of reason for consult to determine capacity and if patient is able to make good decisions regarding her care and disposition.  Patient reports she knew I would be coming by, asked if CSW was Psych MD and reports that we cannot help her she is not going to that facility place.  CSW engaged patient to discuss reasons of why she does not want to go and barriers to getting patient in SNF. Patient reports in Feb. 2013 she went to a facility for rehab at [facility].  Reports everyone was really nice to her and no real issue upon not returning. CSW asked if patient would consider another place for rehab and she adamantly declined reporting "I don't need no rehab".    Patient able to report she lives on [address] in [place] by herself with her two cats. Reports she still drives to the store and her ex-husband comes into the home and takes care of her by going grocery shopping and cleaning up.  Reports she has a son but he has the 'AIDS and cancer" and he is not fit to take care of patient. Reports her niece was here last night and they could stay with her on the weekends but she has small kids and she cannot care for the children and patient, thus only on the weekends.    Patient questioned about reason for admission, current treatment and medical plan. Patient lacks insight as to reason she was admitted reporting it was her legs but able to show she can wiggle her toes. Reports she can walk she is just weak right now but uses a  walker and a cane at home.    Discussed any mental health barriers (depression, SI, grief) in which patient smiles and reports "no, I am not sad or depressed, I am just fine".    Patient is very HOH and must really hear you to answer questions appropriately. Does report niece came to visit last night and it is okay for providers to call her, but reports she is not going to a nursing home. Cannot give adequate reasoning as to why she does not want to go to SNF, just reports no she will not go.  Appears through notes that patient's family is on board for SNF, but being patient refuses we have Psych involved to help determine capacity of patient.  Patient unable to accurately participate in assessment due to [facility], unable engage an answer basic daily living questions and caregivers who can support patient around the clock.  She is alert to self, place, and situation but displays poor judgment and insight for this writer at this time.  Will discuss case with Psych MD, and Family Medicine CSW was called and updated.   Participation Level:  Minimal  Participation Quality  Drowsy  Other - See comment   Other Participation Quality:   Patient makes good eye contact and able to engage, however lacked insight for questions asked about medical care, medications, treatment and safe discharge plan.  Patient was very nice,  smiles throughout assessment and agreeable to participate in assessment.   Affect  Appropriate  Blunt   Cognitive  Alert   Reaction to Medications/Concerns:   none reported.   Modes of Intervention  Behaviors/Psychosis  Clarification  Reality-Testing  Orientation/Capacity   Summary of Progress/Plan at Discharge   1.  Per medical notes and PT/OT notes patient best serviced in SNF but refuses to go with unknown reasons. Patient guarded about not going but continues to refuse. Patient able to identify supports, but not around the clock support.  She is limited in insight and  understanding her condition due to not being able to answer effectively, thus may not be safe for her dc home alone due to self neglect and amount of care needed in the hospital.  2. Patient denies depression and SI, HI, and psychosis. She reports psych is not going to help her unless she goes home.    However she is not able to devise a safe dc plan to decrease readmission and remain safe at home.  Psych MD to be updated regarding assessment and findings.  Will follow up as needed.     Ashley Jacobs, MSW LCSW [phone]

## 2012-02-03 NOTE — Progress Notes (Addendum)
Clinical Child psychotherapist (CSW) returned to visit pt to see whether pt family present in the room, whether pt would consent for CSW to contact niece and whether pt would be open to discuss SNF placement. CSW visited pt room and observed that pt was not pleased with CSW presence. Pt stated she felt pressured to make a decision and needed more time before agreeing to placement. Pt stated she would consider [facility] for SNF placement, would allow CSW to do a SNF search and also consented for CSW to contact her niece Mark. Pt also stated she is wanting to designate her nieces as POA and are currently working on the paperwork. Pt asked CSW not to return until tomorrow to discuss placement. CSW apologized to pt for discussing such a difficult topic and explained that CSW did not mean to pressure pt however CSW does want to ensure pt safety before discharging. Pt understood.  CSW contacted pt niece Misty over the phone and informed her of pt desire to return home however currently considering SNF placement. Niece stated she is aware of pt hesitation and willingness to now consider. Niece disclosed she is concerned with pt safety at home as pt is no longer able to bathe, cook, or dress herself. Pt niece states she and her mother are very involved in pt care and per pt request plan to be pt POA. CSW informed niece of inability to complete any paperwork at the moment due to awaiting a psychiatry evaluation to determine pt capacity. CSW also informed niece that CSW only notarizes HCPOA paperwork - pt niece understanding and appreciative of information.    CSW to do a SNF search and follow up with pt tomorrow. Theresia Bough, MSW, Theresia Majors [phone]

## 2012-02-03 NOTE — Progress Notes (Signed)
PCP Note  Met with Ms V and her niece and her husband.  She is alert and knows me.   She is "considering" NH placement.  Family would prefer [facility] in [place] which is near them.  She definitely would not be safe at home.   I will continue to encourage her to accept placement  Appreciate great care of the in patient team.

## 2012-02-03 NOTE — Consult Note (Signed)
Patient Identification:  Tina [name] Date of Evaluation:  02/03/2012  Reason for Consult: evaluate capacity  Referring Provider: Dr. Ermalinda Memos   History of Present Illness:71 yo female presents with h/o DMII  Acute renal failure, anemia and hypertension who presented to hospital with hyperglycemia  Past Psychiatric History:not stated   Past Medical History:     Past Medical History  Diagnosis Date  . Normal exercise sestamibi stress test 2007    [facility]  . Diabetes mellitus   . Hyperlipidemia   . Hypertension   . Heart murmur   . Chronic kidney disease     CKD stage 3  . Depression   . Stroke 06/2011    TIA        Past Surgical History  Procedure Date  . Bladder surgery 1994  . Knee surgery as child  . Abdominal hysterectomy     Allergies:  Allergies  Allergen Reactions  . Morphine And Related     Current Medications:  Prior to Admission medications   Medication Sig Start Date End Date Taking? Authorizing Provider  ALPRAZolam Prudy Feeler) 0.5 MG tablet Take 0.5 mg by mouth 3 (three) times daily as needed. For anxiety   Yes Historical Provider, MD  citalopram (CELEXA) 20 MG tablet Take 20 mg by mouth daily.   Yes Historical Provider, MD  cyclobenzaprine (FLEXERIL) 10 MG tablet Take 10 mg by mouth 2 (two) times daily as needed. For muscle spasms   Yes Historical Provider, MD  diclofenac sodium (VOLTAREN) 1 % GEL Apply 1 application topically 4 (four) times daily as needed. As needed for pain 10/03/11  Yes Carney Living, MD  diltiazem (TIAZAC) 240 MG 24 hr capsule Take 240 mg by mouth daily.   Yes Historical Provider, MD  enalapril (VASOTEC) 20 MG tablet Take 1 tablet (20 mg total) by mouth daily. 07/09/11  Yes Lonia Skinner, MD  insulin aspart (NOVOLOG) 100 UNIT/ML injection Inject 6-8 Units into the skin 3 (three) times daily before meals. Inject 6 units in AM, 8 units at lunch, and 6 units at dinner 09/23/11  Yes Carney Living, MD  insulin glargine  (LANTUS) 100 UNIT/ML injection Inject 24 Units into the skin at bedtime. 07/28/11  Yes Carney Living, MD  meclizine (ANTIVERT) 12.5 MG tablet Take 12.5 mg by mouth 3 (three) times daily as needed. For dizziness   Yes Historical Provider, MD  pregabalin (LYRICA) 75 MG capsule Take 75 mg by mouth 2 (two) times daily.   Yes Historical Provider, MD  temazepam (RESTORIL) 15 MG capsule Take 15 mg by mouth at bedtime as needed. For sleep   Yes Historical Provider, MD  torsemide (DEMADEX) 20 MG tablet Take 20 mg by mouth daily.   Yes Historical Provider, MD    Social History:    reports that she has never smoked. She has never used smokeless tobacco. She reports that she does not drink alcohol or use illicit drugs.   Family History:    Family History  Problem Relation Age of Onset  . Coronary artery disease Mother   . Diabetes Mother   . Heart disease Mother   . Diabetes Sister   . Hypertension Sister   . Cancer Father     Mental Status Examination/Evaluation: Objective:  Appearance: Casual and Obese and in her care  Psychomotor Activity:  Normal  Eye Contact::  Good  Speech:  Clear and Coherent  Volume:  Normal  Mood:  Depressed  Affect:  Congruent  Thought  Process:  Coherent, Relevant and Not able to grasp the significance of her condition  Orientation:  Other:  patient is oriented to person place and situation but not to the complications of her medical care and unaware of her inability to care for herself when isolated  Thought Content:  Focused on preserving status quo, unwilling to consider all options   Suicidal Thoughts:  No  Homicidal Thoughts:  No  Judgement:  Impaired  Insight:  Lacking    DIAGNOSIS:   AXIS I   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  AXIS IV economic problems, other psychosocial or environmental problems, problems related to social environment, problems with primary  support group and Patient is isolated in her own residence without reasonable family support due to their own obligations.  AXIS V 51-60 moderate symptoms   Assessment/Plan: Discussed with Psych CSW This patient has a pleasant affect, cooperative in speaking but clearly in considering all of her medical needs, she is not fully aware of the need for assistance. She insists on returning home at this time well the medical team is encouraging her to consider nursing home placement so she may achieve that her stability in walking and moving on her own. She is at great risk of further complications, lack of appropriate medical treatment as prescribed and risk of repeated hospitalizations if stability is not established at this time with documented ability for independent mobility. At the very best, she may need home visitation to be assured that she is following her medication regimen and having no problems arising in her own home. This will require for future evaluation as she progresses. The family has been contacted and hopefully, with air support, she will come to realization that she does need to spend time in SNF. RECOMMENDATION: 1.  Patient lacks capacity to appreciate her physical and medical needs to be left on her own in her own residence. 2.  Suggest recruitment of family members available to support and encourage patient to consider the stabilization she needs provided by S. NF 3.  Will follow patient. Nkenge Sonntag J. Ferol Luz, MD Psychiatrist  02/03/2012 11:46 PM

## 2012-02-04 LAB — CBC
HCT: 30.1 % — ABNORMAL LOW (ref 36.0–46.0)
Hemoglobin: 9.8 g/dL — ABNORMAL LOW (ref 12.0–15.0)
MCH: 27.2 pg (ref 26.0–34.0)
MCHC: 32.5 g/dL (ref 30.0–36.0)
MCV: 83.6 fL (ref 78.0–100.0)
Platelets: 169 10*3/uL (ref 150–400)
RBC: 3.6 MIL/uL — ABNORMAL LOW (ref 3.87–5.11)
RDW: 14.3 % (ref 11.5–15.5)
WBC: 6 10*3/uL (ref 4.0–10.5)

## 2012-02-04 LAB — GLUCOSE, CAPILLARY
Glucose-Capillary: 245 mg/dL — ABNORMAL HIGH (ref 70–99)
Glucose-Capillary: 268 mg/dL — ABNORMAL HIGH (ref 70–99)

## 2012-02-04 LAB — COMPREHENSIVE METABOLIC PANEL
AST: 13 U/L (ref 0–37)
Albumin: 2.5 g/dL — ABNORMAL LOW (ref 3.5–5.2)
Calcium: 8.9 mg/dL (ref 8.4–10.5)
Creatinine, Ser: 2.52 mg/dL — ABNORMAL HIGH (ref 0.50–1.10)
GFR calc non Af Amer: 18 mL/min — ABNORMAL LOW (ref >90)
Total Protein: 6.5 g/dL (ref 6.0–8.3)

## 2012-02-04 LAB — RENAL FUNCTION PANEL
CO2: 24 mEq/L (ref 19–32)
GFR calc Af Amer: 16 mL/min — ABNORMAL LOW (ref >90)
Glucose, Bld: 154 mg/dL — ABNORMAL HIGH (ref 70–99)
Potassium: 5 mEq/L (ref 3.5–5.1)
Sodium: 141 mEq/L (ref 135–145)

## 2012-02-04 LAB — RPR: RPR Ser Ql: NONREACTIVE

## 2012-02-04 MED ORDER — OXYCODONE HCL 5 MG PO TABS
5.0000 mg | ORAL_TABLET | ORAL | Status: DC | PRN
  Administered 2012-02-04: 5 mg via ORAL
  Filled 2012-02-04: qty 1

## 2012-02-04 MED ORDER — INSULIN ASPART 100 UNIT/ML ~~LOC~~ SOLN
0.0000 [IU] | Freq: Every day | SUBCUTANEOUS | Status: DC
  Administered 2012-02-05: 3 [IU] via SUBCUTANEOUS

## 2012-02-04 MED ORDER — INSULIN GLARGINE 100 UNIT/ML ~~LOC~~ SOLN
12.0000 [IU] | Freq: Every day | SUBCUTANEOUS | Status: DC
  Administered 2012-02-04 – 2012-02-05 (×2): 12 [IU] via SUBCUTANEOUS

## 2012-02-04 MED ORDER — INSULIN ASPART 100 UNIT/ML ~~LOC~~ SOLN
0.0000 [IU] | Freq: Three times a day (TID) | SUBCUTANEOUS | Status: DC
  Administered 2012-02-05: 5 [IU] via SUBCUTANEOUS
  Administered 2012-02-06: 1 [IU] via SUBCUTANEOUS
  Administered 2012-02-06: 2 [IU] via SUBCUTANEOUS

## 2012-02-04 NOTE — Progress Notes (Signed)
Clinical Child psychotherapist (CSW) met with pt and pt nieces [place] and [place]. Pt nieces supportive, involved in pt care and encouraged pt to agree to placement. Pt has agreed to SNF placement at [facility] in [place]. CSW has contacted the facility to begin the insurance authorization which can take at least 24 hours. CSW will continue following and facilitate with dc once insurance authorization is received and pt is stable.  Theresia Bough, MSW, Theresia Majors [phone]

## 2012-02-04 NOTE — Progress Notes (Signed)
[facility] Progress Note    Subjective:   No acute events overnight, improving renal function    Objective:   BP 145/73  Pulse 88  Temp 98.1 F (36.7 C) (Oral)  Resp 18  Ht 5\' 5"  (1.651 m)  Wt 260 lb 3.2 oz (118.026 kg)  BMI 43.30 kg/m2  SpO2 99%  Intake/Output Summary (Last 24 hours) at 02/04/12 1109 Last data filed at 02/04/12 0850  Gross per 24 hour  Intake    360 ml  Output   2000 ml  Net  -1640 ml   Since admission: -85  Temp:  [97.9 F (36.6 C)-99 F (37.2 C)] 98.1 F (36.7 C) (09/25 0500) Pulse Rate:  [85-95] 88  (09/25 0500) Resp:  [15-18] 18  (09/25 0500) BP: (109-145)/(34-73) 145/73 mmHg (09/25 0500) SpO2:  [91 %-99 %] 99 % (09/25 0500) Weight:  [260 lb 3.2 oz (118.026 kg)] 260 lb 3.2 oz (118.026 kg) (09/25 0500)   Weight change: -4 lb 12.7 oz (-2.174 kg)  Physical Exam: [id number] WF, mild discomfort, diaphoretic, non distressed, obese CVS: RRR, 2/6 systolic murmur  Resp: Lungs CTA-B, no rales Abd: protuberant, distended, hypoactive bowel sounds, non tender to palpation  Ext: 2+ pitting edema of foot and pre-tibia, very tender to palpation, bilateral heel ulcers Back: pre-sacral edema   Imaging: No results found.  Labs: BMET  Lab 02/04/12 0537 02/03/12 0643 02/02/12 0500 02/01/12 1705 02/01/12 0855 02/01/12 0014 01/31/12 1733  NA 141 140 135 133* 134* 135 131*  K 5.0 4.6 4.5 4.0 3.9 3.8 4.3  CL 105 104 101 97 96 97 92*  CO2 24 22 19  18* 18* 20 17*  GLUCOSE 154* 157* 129* 161* 131* 173* 474*  BUN 66* 80* 82* 85* 89* 92* 92*  CREATININE 3.18* 4.54* 4.66* 5.19* 5.40* 5.73* 5.54*  ALB -- -- -- -- -- -- --  CALCIUM 8.3* 8.2* 7.5* 7.4* 7.1* 7.5* 7.8*  PHOS 5.5* -- 7.0* -- -- 8.2* --   CBC  Lab 02/04/12 0537 02/02/12 0500 02/01/12 0855 01/31/12 1733  WBC 6.0 7.0 9.1 10.9*  NEUTROABS -- -- -- 8.9*  HGB 9.8* 8.9* 9.2* 10.8*  HCT 30.1* 26.7* 27.8* 31.1*  MCV 83.6 81.2 82.0 80.2  PLT 150 112* 152 153    Medications:       . ALPRAZolam  0.25 mg Oral TID  . antiseptic oral rinse  15 mL Mouth Rinse BID  . aspirin  81 mg Oral Daily  . calcium acetate  667 mg Oral TID WC  . citalopram  20 mg Oral Daily  . collagenase   Topical Daily  . diltiazem  120 mg Oral Daily  . ferric gluconate (FERRLECIT/NULECIT) IV  125 mg Intravenous Daily  . insulin aspart  0-9 Units Subcutaneous Q4H  . insulin glargine  12 Units Subcutaneous QHS  . polyethylene glycol  17 g Oral Daily  . senna  1 tablet Oral Daily  . sodium chloride  3 mL Intravenous Q12H  . temazepam  7.5 mg Oral QHS  . DISCONTD: insulin glargine  10 Units Subcutaneous QHS     Assessment/ Plan:   Pt is a 71 y.o. yo female with CKD stage III, insulin dependent DM, and A fib who presented with an acute kidney injury, acidosis, hyperglycemia, and profuse diarrhea.   Acute Kidney Injury: The patient's creatinine was 1.8 when measured as an outpatient in March. It was then 2.24 on 01/12/12 and [facility] and increased to 5.5 upon  admission to [facility]. Her BUN at admission was also elevated to 93 from 22 on 01/12/12. FeNa > 5. Likely intrinsic renal from ACE-I and ATN.  - Creat improved o/n with UOP - Volume status still appears net positive, so no IVF  - BP stable - Don't restart ACE-I unless the patient is discharged to SNF or LTAC as this is likely going to cause more renal problems in the future  - Patient stable and appropriate from d/c from renal perspective   Hyperphosphatemia: Likely secondary to CKD, started phosphate binder on 9/23 - improving on calcium acetate  Altered Mental Status: Improved from 9/22, Lyrica held 9/22; MMSE 22/30 - continue to hold Lyrica until renal function improved  - management per FMTS   Anemia, Normocytic: Baseline from Feb 2013 appears to be 11.8; It was 9.2 on admission. This could be resultant from GI loss or chronic kidney disease. Fe not calculable at < 10.  - Give IV iron today - Nulecit 125 mg day 3/10     Thank  you for this interesting consultation.    Si Raider Clinton Sawyer, MD 02/04/2012, 11:09 AM   I have seen and examined this patient and agree with the plan of care seen and eval.  Slow recovery c/w chronic disease. Try with out foley.  Vol xs yet.  Can F/u outpatient if better in am .  Chalmers Iddings L 02/04/2012, 11:45 AM

## 2012-02-04 NOTE — Progress Notes (Signed)
PCP Spoke with Ms V and her family.  Ms V agrees to go to SNF.   Her family and I concur Will discus with team

## 2012-02-04 NOTE — Progress Notes (Signed)
[facility] Family [facility] Teaching Service Progress Note:  Team was called by nurse because patient stated that she was seeing a man with lights behind him in the corner of her room, around 9pm. She received xanax 0.5mg  in the morning and around 5pm. She also received oxycodone 5mg  at 9am today.  On exam patient was somnolent but responded to voice. She was alert and oriented to person, date of birth and fact that she was in [place], but could not say that she was in the hospital.  Neuro: 4/5 grip strength bilaterally. CN2-12 grossly intact.  Pulm: CTA b/l on anterior exam CV: RRR, no murmur Vitals: Filed Vitals:   02/03/12 2100 02/04/12 0500 02/04/12 1320 02/04/12 2000  BP: 109/71 145/73 133/79 135/78  Pulse: 85 88 95 94  Temp: 97.9 F (36.6 C) 98.1 F (36.7 C) 98.5 F (36.9 C) 99.1 F (37.3 C)  TempSrc: Axillary Oral Oral Oral  Resp: 18 18 18 18   Height:      Weight:  260 lb 3.2 oz (118.026 kg)    SpO2: 93% 99% 98% 90%   A/P: 71 yo female with h/o DM2, Acute renal failure, anemia and hypertension who presented to hospital with hyperglycemia. - Delirium: it appears that patient has been waxing and waning in mental status during her current hospitalization. Likely from benzo and opiates. Will hold night time dose of restoril and xanax. No focal neuro deficits on exam and will therefore hold on head imaging for now. Patient is afebrile making infectious cause less likely, but given presence of foley, will obtain UA and UCx. Will check CBC and CMP to evaluate for any worsening anemia, renal dysfunction, acidosis or liver enzyme dysfunction. Will monitor closely.

## 2012-02-04 NOTE — Progress Notes (Signed)
CSW informed by FMTS that pt was no longer considering placement this morning. CSW awaiting the psychiatry note to determine whether pt has capacity and CSW will then proceed with discharge planning. CSW spoke with pt niece Misty this morning who informed CSW that she would come to the hospital today to discuss placement with pt.  Theresia Bough, MSW, Theresia Majors [phone]

## 2012-02-04 NOTE — Progress Notes (Signed)
Physical Therapy Treatment Patient Details Name: Tina [name] MRN: [id number] DOB: February 02, 1941 Today's Date: 02/04/2012 Time: 2952-8413 PT Time Calculation (min): 18 min  PT Assessment / Plan / Recommendation Comments on Treatment Session  pt presents with Sepsis and A-fib.  pt with cognitive deficts and gets upset when attempting to orient pt.  pt requires 2 person A for mobility today and needs to D/C to SNF.  Not safe for home.      Follow Up Recommendations  Skilled nursing facility    Barriers to Discharge        Equipment Recommendations  3 in 1 bedside comode    Recommendations for Other Services    Frequency Min 3X/week   Plan Discharge plan remains appropriate;Frequency remains appropriate    Precautions / Restrictions Precautions Precautions: Fall Restrictions Weight Bearing Restrictions: No   Pertinent Vitals/Pain No c/o pain.      Mobility  Bed Mobility Bed Mobility: Supine to Sit;Sitting - Scoot to Edge of Bed Supine to Sit: 1: +2 Total assist Supine to Sit: Patient Percentage: 40% Sitting - Scoot to Edge of Bed: 2: Max assist Details for Bed Mobility Assistance: cues for encouragement, sequencing, safe technique.   Transfers Transfers: Sit to Stand;Stand to Sit;Stand Pivot Transfers Sit to Stand: 1: +2 Total assist;With upper extremity assist;From bed Sit to Stand: Patient Percentage: 30% Stand to Sit: 1: +2 Total assist;With upper extremity assist;To chair/3-in-1;With armrests Stand to Sit: Patient Percentage: 30% Stand Pivot Transfers: 1: +2 Total assist Stand Pivot Transfers: Patient Percentage: 30% Details for Transfer Assistance: pt needs max cueing for safe technique, sequencing, encouragement.  pt remains flexed and leans on PT and tech.   Ambulation/Gait Ambulation/Gait Assistance: Not tested (comment) Stairs: No Wheelchair Mobility Wheelchair Mobility: No    Exercises     PT Diagnosis:    PT Problem List:   PT Treatment Interventions:      PT Goals Acute Rehab PT Goals Time For Goal Achievement: 02/16/12 PT Goal: Supine/Side to Sit - Progress: Progressing toward goal PT Goal: Sit to Stand - Progress: Progressing toward goal PT Transfer Goal: Bed to Chair/Chair to Bed - Progress: Progressing toward goal  Visit Information  Last PT Received On: 02/04/12 Assistance Needed: +2    Subjective Data  Subjective: My blankets are all messed up.     Cognition  Overall Cognitive Status: Impaired Area of Impairment: Memory;Safety/judgement;Awareness of deficits;Problem solving Arousal/Alertness: Awake/alert Orientation Level: Disoriented to;Place;Time;Situation Behavior During Session: [facility] 2 for tasks performed Memory Deficits: pt thought she already ate lunch even once oriented to time of day.  pt confused about present situation and location.   Following Commands: Follows one step commands with increased time Safety/Judgement: Decreased awareness of safety precautions;Decreased safety judgement for tasks assessed Awareness of Deficits: pt wants to go home, but not recognizing that she need 2 person A to sit at EOB.      Balance  Balance Balance Assessed: No  End of Session PT - End of Session Equipment Utilized During Treatment: Gait belt Activity Tolerance: Patient limited by fatigue Patient left: in chair;with call bell/phone within reach Nurse Communication: Mobility status   GP     Sunny Schlein, [place] [phone] 02/04/2012, 12:56 PM

## 2012-02-04 NOTE — Progress Notes (Signed)
Inpatient Diabetes Program Recommendations  AACE/ADA: New Consensus Statement on Inpatient Glycemic Control (2013)  Target Ranges:  Prepandial:   less than 140 mg/dL      Peak postprandial:   less than 180 mg/dL (1-2 hours)      Critically ill patients:  140 - 180 mg/dL    Results for LASHARA, UREY (MRN [id number]) as of 02/04/2012 11:27  Ref. Range 02/03/2012 01:06 02/03/2012 04:27 02/03/2012 07:57 02/03/2012 12:28 02/03/2012 15:33 02/03/2012 16:53 02/03/2012 20:09  Glucose-Capillary Latest Range: 70-99 mg/dL 657 (H) 846 (H) 962 (H) 206 (H) 216 (H) 210 (H) 279 (H)   Noted Lantus increased to 12 units QHS today.  Patient having elevated postprandial CBGs.  Taking PO diet.  Inpatient Diabetes Program Recommendations Correction (SSI): Please change Novolog correction scale (SSI) to tid ac + HS (currently ordered Q4 hours & patient eating PO diet) Insulin - Meal Coverage: Please consider adding meal coverage- Novolog 3 units tid with meals.  Note: Will follow. Ambrose Finland RN, MSN, CDE Diabetes Coordinator Inpatient Diabetes Program [phone]

## 2012-02-04 NOTE — Progress Notes (Signed)
Patient still awaiting capacity evaluation from Psych MD Bogard who reports she has seen patient, yet has not completed a note. Patient this am is refusing SNF and not making a decision per her report. Patient is confused this am and becomes frightened asking " How did you get in here, I didn't hear you knock".  CSW reports she knocked and patient must have heard CSW because she asked : "who knocked".  Patient becomes disoriented and asks: "where are we"? Then states: Ronalee Belts we are at [facility].  Patient does remember this writer and reports:  " I do no feel good at all, I am not making a decision, don't ask me again, and tell that doctor friend of yours I won't tell him neither".  Patient has not been up out of the bed per nursing, and was asked multiple times how she would care for herself at home. Patient unable to answer nor respond.  Towards the end of follow up assessment with Psych CSW, Physical Therapist comes in to work with patient.  Patient wakes up and agreeable to try and sit on side of bed. PT doing most of the work for patient to move, thus if home alone, patient will need assistance getting in and out of bed, bathing, going to the bathroom and taking overall safe good care of herself.  Again, this writer feels SNF will be most appropriate for patient and on 9/24 she was agreeable per CSW [place].   Will be calling Psych MD to review notes and make determination of capacity.  If lacking, Nelva Bush has been working on SNF placement and per her report patient medically stable for DC. No other psych follow up needed at this time per patient or [place] CSW.    Again working to get in touch with Psych MD and complete note.  Otherwise will sign off.  Please call if needs arise.  Ashley Jacobs, MSW LCSW [phone]

## 2012-02-04 NOTE — Progress Notes (Signed)
Patient ID: Tina [name], female   DOB: 08-23-1940, 71 y.o.   MRN: [id number] [facility] Teaching Service  Pager [phone]  Subjective: Patient reports doing well this morning.  Complains of some pain this morning and asking for pain medication. No additional complaints. Has had a BM overnight with new bowel regimen.  When asked about placement at SNF, patient moaned and didn't want to discuss this issue.  Objective: Vital signs in last 24 hours: Temp:  [97.9 F (36.6 C)-99 F (37.2 C)] 98.1 F (36.7 C) (09/25 0500) Pulse Rate:  [85-95] 88  (09/25 0500) Resp:  [15-18] 18  (09/25 0500) BP: (109-145)/(34-73) 145/73 mmHg (09/25 0500) SpO2:  [91 %-99 %] 99 % (09/25 0500) Weight:  [260 lb 3.2 oz (118.026 kg)] 260 lb 3.2 oz (118.026 kg) (09/25 0500) Weight change: -4 lb 12.7 oz (-2.174 kg) Last BM Date: 02/03/12   Intake/Output Summary (Last 24 hours) at 02/04/12 0849 Last data filed at 02/04/12 0500  Gross per 24 hour  Intake      0 ml  Output   2000 ml  Net  -2000 ml   Weight change: -4 lb 12.7 oz (-2.174 kg)  Physical Exam:  General: alert and appropriate today. Answers questions.  Cardiovascular: RRR, no murmur  Respiratory: nonlabored, CTAB anteriorly. No increased WOB Abdomen: obese, mild distention. Diffusely TTP, no distinct masses, no rebound or guarding, BS active  Extremities: 1+ pitting edema bilaterally- improving.  Skin: no ecchymosis noted. Bilateral heals bandaged today with an occlusive dressing. Large Breakdown on her sacrum (per report). Neuro: alert, oriented x 3. Normal speech.  Lab Results:  Basename 02/04/12 0537 02/02/12 0500  WBC 6.0 7.0  HGB 9.8* 8.9*  HCT 30.1* 26.7*  PLT 150 112*   BMET  Basename 02/04/12 0537 02/03/12 0643  NA 141 140  K 5.0 4.6  CL 105 104  CO2 24 22  GLUCOSE 154* 157*  BUN 66* 80*  CREATININE 3.18* 4.54*  CALCIUM 8.3* 8.2*    Medications:  Scheduled:    . ALPRAZolam  0.25 mg Oral TID  . antiseptic  oral rinse  15 mL Mouth Rinse BID  . aspirin  81 mg Oral Daily  . calcium acetate  667 mg Oral TID WC  . citalopram  20 mg Oral Daily  . collagenase   Topical Daily  . diltiazem  120 mg Oral Daily  . ferric gluconate (FERRLECIT/NULECIT) IV  125 mg Intravenous Daily  . fluconazole  150 mg Oral Daily  . insulin aspart  0-9 Units Subcutaneous Q4H  . insulin glargine  12 Units Subcutaneous QHS  . polyethylene glycol  17 g Oral Daily  . senna  1 tablet Oral Daily  . sodium chloride  3 mL Intravenous Q12H  . temazepam  7.5 mg Oral QHS  . DISCONTD: insulin glargine  10 Units Subcutaneous QHS   Continuous:    . dextrose 5 % and 0.45% NaCl 10 mL (02/01/12 1403)    Assessment/Plan: Tina [name] is a 71 y.o. year old female with a history of DM, CKD stage III, HTN, AF presenting with diarrhea/abdominal pain, weakness and found to have hyperglycemia, and acute on chronic kidney injury with mild acidosis.   1. Abdominal pain/diarrhea: -  CT: non-obstructing left renal stones not likely contributory. She does have a large stool burden.  - stool c diff: Never collected - Caution with narcotics with resultant lethargy/confusion.  - DC'd antibiotics today. Diarrhea has not been an issue since admission.  -  Added Senna Yesterday. Will likely add Miralax today. Per patient report has had one BM.  2. Acute on chronic renal failure.  - Last cr in epic is 1.8 prior to admission;5.19-->  4.66--> 4.54-->3.18 - Improved mentation  - Foley placed -  FENA is elevated >5, uncertain of patient compliance with home torsemide/ACEi but these could have incited an acute injury with dehydration.   -  Plan to obtain records from [facility] with more recent renal function tests, and other possible exposures to ?dye or other.  - renal recs no IVF and continue to hold ACE given possible contribution to renal failure  3. Acidosis. Mild metabolic with pH 1.61 on admission -->resolved   4. Insomnia.    - Restarted Xanax home med to prevent withdraw  5. Diabetes with uncontrolled hyperglycemia. Likely from infection vs medication noncompliance. -  Insulin gtt successful with CBGs < 200 since admission. -  lantus 12 units (home dose 24 units) with sensitive SS in setting of renal failure.   6. HTN. At goal. Continue dilt. Hold home ACEi and torsemide.  7. Atrial fibrillation. Rate controlled. Continue diltiazem. Hold xarelto with renal failure currently. - Daily INR. Consider heparin bridge, CHADs score 2.  8. Heal ulcers. Present since her previous hospitalization, patient states she was seen at [facility] in last 2 weeks. Will continue wound care.  9. FEN/GI: NPO. Fluids changed to D5NS at 75/hr with addition of bicarb at 75/hr. F/u electrolytes q12 hr. zofran prn.  10. Prophylaxis: heparin sq, consider full bridge with borderline CHADs score.   11. Disposition: Needs SNF placement, patient refusing, awaiting psych recs on capacity. PT/OT ordered with weakness. Maybe prolonged course with severe renal failure.  12. Code Status: patient desires full code.      LOS: 4 days   Marikay Alar PGY-1 02/04/2012, 8:49 AM

## 2012-02-05 DIAGNOSIS — R4182 Altered mental status, unspecified: Secondary | ICD-10-CM | POA: Diagnosis present

## 2012-02-05 LAB — GLUCOSE, CAPILLARY: Glucose-Capillary: 277 mg/dL — ABNORMAL HIGH (ref 70–99)

## 2012-02-05 LAB — URINALYSIS, ROUTINE W REFLEX MICROSCOPIC
Ketones, ur: NEGATIVE mg/dL
Nitrite: NEGATIVE
Specific Gravity, Urine: 1.015 (ref 1.005–1.030)
pH: 5.5 (ref 5.0–8.0)

## 2012-02-05 LAB — URINE MICROSCOPIC-ADD ON

## 2012-02-05 MED ORDER — ACETAMINOPHEN 325 MG PO TABS: 650.0000 mg | ORAL_TABLET | Freq: Four times a day (QID) | ORAL | Status: DC

## 2012-02-05 MED ORDER — DILTIAZEM HCL ER BEADS 120 MG PO CP24: 120.0000 mg | ORAL_CAPSULE | Freq: Every day | ORAL | Status: DC

## 2012-02-05 MED ORDER — TEMAZEPAM 7.5 MG PO CAPS: 7.5000 mg | ORAL_CAPSULE | Freq: Every evening | ORAL | Status: DC | PRN

## 2012-02-05 MED ORDER — ACETAMINOPHEN 325 MG PO TABS
650.0000 mg | ORAL_TABLET | Freq: Four times a day (QID) | ORAL | Status: DC
  Administered 2012-02-05 – 2012-02-06 (×5): 650 mg via ORAL
  Filled 2012-02-05 (×4): qty 2

## 2012-02-05 MED ORDER — ALPRAZOLAM 0.25 MG PO TABS: 0.2500 mg | ORAL_TABLET | Freq: Three times a day (TID) | ORAL | Status: DC

## 2012-02-05 MED ORDER — SODIUM CHLORIDE 0.9 % IV SOLN
400.0000 mg | Freq: Once | INTRAVENOUS | Status: AC
  Administered 2012-02-05: 400 mg via INTRAVENOUS
  Filled 2012-02-05: qty 32

## 2012-02-05 MED ORDER — COLLAGENASE 250 UNIT/GM EX OINT: TOPICAL_OINTMENT | Freq: Every day | CUTANEOUS | Status: DC

## 2012-02-05 NOTE — Progress Notes (Signed)
Attempted to wake patient to give nighttime meds.  Attempted to assess orientation and when asked if pt knew her name, where she was she answered "no".  Pt then stated "ask that man in the corner."  Pt states she sees "a man in the corner with bright lights."  Pt unable to keep focus and continues to attempt to answer but unable to maintain focus and complete sentences.  BP 133/62, HR 84, O2 sat 90% RA and increased to 94% 2L [place] RR 16. MD notified.  Orders received.  Will continue to monitor.

## 2012-02-05 NOTE — Progress Notes (Signed)
I discussed with Dr Kuneff.  I agree with their plans documented in their progress note for today.  

## 2012-02-05 NOTE — Progress Notes (Signed)
Patient ID: Tina [name], female   DOB: Jan 24, 1941, 71 y.o.   MRN: [id number] [facility] Teaching Service  Pager [phone]  Subjective: Patient had episodes of confusion last night. Reported seeing a man in the corner, with light behind him. Was not oriented to place and time during this episode. Probable opioid and benzo reaction with her underlying dementia.  Held Restoril and xanax through the night. Discontinued oxycodone. Also, later in the evening patient was found standing next bed having had a large BM. This morning she was sleeping, but awakened easily with her name. She was more aware of her surroundings and alert than last night. She was uninterested in her breakfast and wanted to go back to sleep. No complaints.   Objective: Vital signs in last 24 hours: Temp:  [97.5 F (36.4 C)-99.1 F (37.3 C)] 97.5 F (36.4 C) (09/26 0447) Pulse Rate:  [92-95] 92  (09/26 0447) Resp:  [18] 18  (09/26 0447) BP: (133-150)/(72-79) 150/72 mmHg (09/26 0447) SpO2:  [90 %-98 %] 97 % (09/26 0447) Weight:  [261 lb 14.5 oz (118.8 kg)] 261 lb 14.5 oz (118.8 kg) (09/26 0447) Weight change: 1 lb 11.3 oz (0.774 kg) Last BM Date: 02/04/12   Intake/Output Summary (Last 24 hours) at 02/05/12 0916 Last data filed at 02/04/12 1500  Gross per 24 hour  Intake    240 ml  Output   1001 ml  Net   -761 ml   Weight change: 1 lb 11.3 oz (0.774 kg)  Physical Exam:  General: More alert this morning. Oriented to person, town, birth date and month. Knew she was in the hospital this morning.  Cardiovascular: RRR, 1/6 SM. Respiratory: nonlabored, CTAB. No increased WOB. No wheezing, rales or rhonchi.  Abdomen: obese. NT. ND. no distinct masses, no rebound or guarding, BS active  Extremities: 1+ pitting edema bilaterally- improving.  Skin: no ecchymosis noted. Bilateral heals bandaged today with an tape dressing. Large [name] of irritation on her sacral [name] is dressing with an occlusive dressing.  Neuro:  alert, oriented. Normal speech.  Lab Results:  [facility] 02/04/12 2244 02/04/12 0537  WBC 6.4 6.0  HGB 10.1* 9.8*  HCT 31.1* 30.1*  PLT 169 150   BMET  Basename 02/04/12 2244 02/04/12 0537  NA 141 141  K 4.8 5.0  CL 105 105  CO2 24 24  GLUCOSE 155* 154*  BUN 54* 66*  CREATININE 2.52* 3.18*  CALCIUM 8.9 8.3*    Medications:  Scheduled:    . acetaminophen  650 mg Oral Q6H  . ALPRAZolam  0.25 mg Oral TID  . antiseptic oral rinse  15 mL Mouth Rinse BID  . aspirin  81 mg Oral Daily  . calcium acetate  667 mg Oral TID WC  . citalopram  20 mg Oral Daily  . collagenase   Topical Daily  . diltiazem  120 mg Oral Daily  . ferric gluconate (FERRLECIT/NULECIT) IV  125 mg Intravenous Daily  . insulin aspart  0-5 Units Subcutaneous QHS  . insulin aspart  0-9 Units Subcutaneous TID WC  . insulin glargine  12 Units Subcutaneous QHS  . polyethylene glycol  17 g Oral Daily  . senna  1 tablet Oral Daily  . sodium chloride  3 mL Intravenous Q12H  . temazepam  7.5 mg Oral QHS  . DISCONTD: insulin aspart  0-9 Units Subcutaneous Q4H   Continuous:    . dextrose 5 % and 0.45% NaCl 10 mL (02/01/12 1403)    Assessment/Plan: Tina Manis  [name] is a 71 y.o. year old female with a history of DM, CKD stage III, HTN, AF presenting with diarrhea/abdominal pain, weakness and found to have hyperglycemia, and acute on chronic kidney injury with mild acidosis.   1. Abdominal pain/diarrhea: -  CT on admission: non-obstructing left renal stones not likely contributory. She does have a large stool burden.  - Caution with narcotics with resultant lethargy/confusion. --> discontinued - Senna, Miralax for stool burden; she had refused both last night. Large BM around 11pm  2. Acute on chronic renal failure.  - Last cr in epic is 1.8 prior to admission;5.19-->  4.66--> 4.54-->3.18--> 2.52 - [place]ane mental status; delirium with underlying dementia - Foley placed - renal recs no IVF and continue to  hold ACE given possible contribution to renal failure  3. Acidosis. Mild metabolic with pH 1.61 on admission -->resolved   4. Insomnia.  - Restarted 1/2 Xanax home med to prevent withdraw--> held through the night d/t confusion  - restarted 1/2 Restoril home med to prevent withdraw --> held through the night d/t confusion  5. Diabetes with uncontrolled hyperglycemia. Likely from infection vs medication noncompliance. -  Insulin gtt successful with CBGs < 200 since admission. -  lantus 12 units (home dose 24 units) with sensitive SS/HS/AC in setting of renal failure.   6. HTN. At goal. Continue dilt. Hold home ACEi and torsemide.  7. Atrial fibrillation. Rate controlled. Continue diltiazem. Hold xarelto with renal failure currently. - Daily INR. Consider heparin bridge, CHADs score 2.  8. Skin breakdown: Present since her previous hospitalization, patient states she was seen at Wound care center in last 2 weeks for heals and sacrum. Will continue wound care.  9. Altered Mental Status: - UA: Triple phosphate crystals, moderate WBC, few bacteria-->pending culture - Electrolytes: Slightly hypocalcemic; otherwise WNL - Held opioids and benzos  - consider sundowning  10. FEN/GI: - KVO'd - Diet: carb modified  - zofran prn.  10. Prophylaxis: heparin sq, consider full bridge with borderline CHADs score.   11. Disposition: Needs SNF placement, patient agreeable to placement.  PT/OT ordered with weakness. Probable placement today.  12. Code Status: patient desires full code.      LOS: 5 days   Felix Pacini PGY-1 02/05/2012, 9:16 AM

## 2012-02-05 NOTE — Discharge Summary (Signed)
Physician Discharge Summary  Patient ID: Tina [name] MRN: [id number] DOB/AGE: 1940/06/18 71 y.o.  Admit date: 01/31/2012 Discharge date: 02/06/2012  Admission Diagnoses: altered mental status  Discharge Diagnoses:  Principal Problem:  *Acute kidney injury Active Problems:  DM (diabetes mellitus) with complications  ESSENTIAL HYPERTENSION  CHRONIC KIDNEY DISEASE STAGE III (MODERATE)  Atrial fibrillation  Diarrhea  Diastolic dysfunction  Pressure ulcer of heel, bilateral  Altered mental status   Discharged Condition: good  Hospital Course:Tina [name] is a 71 y.o. year old female with a history of DM, CKD stage III, HTN, AF presenting with diarrhea, hyperglycemia and possible dehydration. She presented to the ED  via ambulance. History provided by family to EDP initially. She was admitted at outside facility (reported [place]) 2 weeks ago and treated for diverticulitis, discharged on antibiotics. Has continued loose stools, abdominal pain, decreased oral intake. Found to have hyperglycemia > 500, cr 5.5, BUN 92, FENA >5 in ED. Patient was given percocet, started on insulin drip and [facility] asked to admit for dehydration and further workup. Level V caveat for lethargy/AMS after receiving percocet and family has departed at time of interview.  She was placed on the glucomander for her hyperglycemia with SSI to follow once BG <250. She was given 1/2 her home xanax to prevent withdraw. Urine culture had no growth. She was given senokot and Miralax for stool burden. She initially mentally cleared to baseline when all benzodiazepines were held the first night. Once they were re-administered with oxycodone for abdominal pain, she again became confused.Patient has waxed/waned in confusion during the course of her stay, believed to be from opiate and benzo use, with underlying dementia. CBC and renal functions was followed closely and improved. She has had two large BMs since arrival. Wound  care was consulted or bilateral heel ulcerations and sacral wound that was preset on admission. Nephrology was consulted for dialysis. IV Iron replacement was administered daily. PT/OT was consulted for ADL's/strength and possible SNF placement recommendations. Recommendations discussed with family/patient and SNF placement was agreed upon. Patent was sent to SNF with the medications below.   Mini mental status exam was performed during time of clarity, she scored a 22/30. She lost 2 points on orientation, 3 points on registration, and 3 points on recall.   **Avoid opioids, oxycodone alters patient mental status   Sent to SNF with prescriptions for acetaminophen, diltiazem, alprazolan 0.25, Restoril 7.5 mg, collagenase ointment.   Consults: Psychiatry, nephrology  Psych: 1. Patient lacks capacity to appreciate her physical and medical needs to be left on her own in her own residence.  2. Suggest recruitment of family members available to support and encourage patient to consider the stabilization she needs provided by S. [facility]   Nephrology:  -  continue to hold Lyrica as an outpatient unless it needs to be restarted, but be cautious with use in impaired kidney function - Discontinue ACE/ARB - May consider starting a loop diuretic in 1 week  Significant Diagnostic Studies:  Blood cultures NGTD  BMET    Component Value Date/Time   NA 141 02/04/2012 2244   K 4.8 02/04/2012 2244   CL 105 02/04/2012 2244   CO2 24 02/04/2012 2244   GLUCOSE 155* 02/04/2012 2244   BUN 54* 02/04/2012 2244   CREATININE 2.52* 02/04/2012 2244   CREATININE 1.87* 07/28/2011 1452   CALCIUM 8.9 02/04/2012 2244   GFRNONAA 18* 02/04/2012 2244   GFRAA 21* 02/04/2012 2244      01/31/12 CXR: IMPRESSION:  1.  Low lung volumes and left base atelectasis.  2. Pulmonary vascular congestion  02/01/12 CT ABD & Pelvis:IMPRESSION:  1. Approximately 4 mm stone within the superior aspect of the left ureter, just caudal to the left UPJ  without associated upstream  pelvocaliectasis or perinephric stranding.  2. Unchanged 5 mm nonobstructing stone within the inferior pole of the left kidney.  3. Moderate to large colonic stool burden without evidence of enteric obstruction.  4. Cholelithiasis without definite evidence of cholecystitis.  5. Cardiomegaly.  6. Asymmetric left lower lobe air space opacities with bronchograms, possibly atelectasis, though worrisome for infection.   Treatments: IV hydration,  analgesia: Vicodin and percocet, anticoagulation: ASA, insulin: Lantus and novalog, dialysis  Discharge Exam: Blood pressure 149/54, pulse 90, temperature 97.9 F (36.6 C), temperature source Oral, resp. rate 22, height 5\' 5"  (1.651 m), weight 261 lb 14.5 oz (118.8 kg), SpO2 99.00%. General: More alert this morning. Oriented to person, town, birth date and month. Knew she was in the hospital this morning.  Cardiovascular: RRR, 1/6 SM.  Respiratory: nonlabored, CTAB. No increased WOB. No wheezing, rales or rhonchi.  Abdomen: obese. NT. ND. no distinct masses, no rebound or guarding, BS active  Extremities: 1+ pitting edema bilaterally- improving.  Skin: no ecchymosis noted. Bilateral heals bandaged today with an tape dressing. Large area of irritation on her sacral area is dressing with an occlusive dressing.  Neuro: alert, oriented. Normal speech.  Disposition: 03-Skilled Nursing Facility      Discharge Orders    Future Appointments: Provider: Department: Dept Phone: Center:   02/11/2012 8:45 AM Carney Living, MD [facility] [phone] [facility]       Medication List     As of 02/06/2012  9:25 AM    STOP taking these medications         cyclobenzaprine 10 MG tablet   Commonly known as: FLEXERIL      enalapril 20 MG tablet   Commonly known as: VASOTEC      pregabalin 75 MG capsule   Commonly known as: LYRICA      torsemide 20 MG tablet   Commonly known as: DEMADEX      TAKE these medications          acetaminophen 325 MG tablet   Commonly known as: TYLENOL   Take 2 tablets (650 mg total) by mouth every 6 (six) hours.      ALPRAZolam 0.25 MG tablet   Commonly known as: XANAX   Take 1 tablet (0.25 mg total) by mouth 3 (three) times daily.      citalopram 20 MG tablet   Commonly known as: CELEXA   Take 20 mg by mouth daily.      collagenase ointment   Commonly known as: SANTYL   Apply topically daily.      diclofenac sodium 1 % Gel   Commonly known as: VOLTAREN   Apply 1 application topically 4 (four) times daily as needed. As needed for pain      diltiazem 120 MG 24 hr capsule   Commonly known as: TIAZAC   Take 1 capsule (120 mg total) by mouth daily.      insulin aspart 100 UNIT/ML injection   Commonly known as: novoLOG   Inject 6-8 Units into the skin 3 (three) times daily before meals. Inject 6 units in AM, 8 units at lunch, and 6 units at dinner      insulin glargine 100 UNIT/ML injection   Commonly known as: LANTUS  Inject 24 Units into the skin at bedtime.      meclizine 12.5 MG tablet   Commonly known as: ANTIVERT   Take 12.5 mg by mouth 3 (three) times daily as needed. For dizziness      temazepam 7.5 MG capsule   Commonly known as: RESTORIL   Take 1 capsule (7.5 mg total) by mouth at bedtime as needed for sleep.         Follow-up Information    Follow up with Carney Living, MD. On 02/11/2012. (@8 :45)    Contact information:   [address] [place] [address] [phone]          Signed: Felix Pacini 02/06/2012, 9:25 AM

## 2012-02-05 NOTE — Progress Notes (Signed)
I discussed with Dr Birdie Sons.  I agree with their plans documented in their progress note for today. Dr Deirdre Priest saw the patient today.  See his note.

## 2012-02-05 NOTE — Progress Notes (Signed)
[facility] Progress Note    Subjective:   Problems with delirium overnight; improving renal function; patient without complaints this AM    Objective:   BP 150/72  Pulse 92  Temp 97.5 F (36.4 C) (Oral)  Resp 18  Ht 5\' 5"  (1.651 m)  Wt 261 lb 14.5 oz (118.8 kg)  BMI 43.58 kg/m2  SpO2 97%  Intake/Output Summary (Last 24 hours) at 02/05/12 1046 Last data filed at 02/04/12 1500  Gross per 24 hour  Intake    240 ml  Output   1001 ml  Net   -761 ml   Net since admission: -2.6L   Temp:  [97.5 F (36.4 C)-99.1 F (37.3 C)] 97.5 F (36.4 C) (09/26 0447) Pulse Rate:  [92-95] 92  (09/26 0447) Resp:  [18] 18  (09/26 0447) BP: (133-150)/(72-79) 150/72 mmHg (09/26 0447) SpO2:  [90 %-98 %] 97 % (09/26 0447) Weight:  [261 lb 14.5 oz (118.8 kg)] 261 lb 14.5 oz (118.8 kg) (09/26 0447)   Weight change: 1 lb 11.3 oz (0.774 kg)  Physical Exam: WUJ:WJXBJYN WF, mild discomfort, diaphoretic, non distressed, obese CVS: RRR, 2/6 systolic murmur  Resp: Lungs CTA-B, no rales Abd: protuberant, distended, hypoactive bowel sounds, non tender to palpation  Ext: 2+ pitting edema of foot and pre-tibia, very tender to palpation, bilateral heel ulcers Back: pre-sacral edema   Imaging: No results found.  Labs: BMET  Lab 02/04/12 2244 02/04/12 0537 02/03/12 0643 02/02/12 0500 02/01/12 1705 02/01/12 0855 02/01/12 0014  NA 141 141 140 135 133* 134* 135  K 4.8 5.0 4.6 4.5 4.0 3.9 3.8  CL 105 105 104 101 97 96 97  CO2 24 24 22 19  18* 18* 20  GLUCOSE 155* 154* 157* 129* 161* 131* 173*  BUN 54* 66* 80* 82* 85* 89* 92*  CREATININE 2.52* 3.18* 4.54* 4.66* 5.19* 5.40* 5.73*  ALB -- -- -- -- -- -- --  CALCIUM 8.9 8.3* 8.2* 7.5* 7.4* 7.1* 7.5*  PHOS -- 5.5* -- 7.0* -- -- 8.2*   CBC  Lab 02/04/12 2244 02/04/12 0537 02/02/12 0500 02/01/12 0855 01/31/12 1733  WBC 6.4 6.0 7.0 9.1 --  NEUTROABS -- -- -- -- 8.9*  HGB 10.1* 9.8* 8.9* 9.2* --  HCT 31.1* 30.1* 26.7* 27.8* --  MCV  83.6 83.6 81.2 82.0 --  PLT 169 150 112* 152 --    Medications:       . acetaminophen  650 mg Oral Q6H  . ALPRAZolam  0.25 mg Oral TID  . antiseptic oral rinse  15 mL Mouth Rinse BID  . aspirin  81 mg Oral Daily  . calcium acetate  667 mg Oral TID WC  . citalopram  20 mg Oral Daily  . collagenase   Topical Daily  . diltiazem  120 mg Oral Daily  . ferric gluconate (FERRLECIT/NULECIT) IV  125 mg Intravenous Daily  . insulin aspart  0-5 Units Subcutaneous QHS  . insulin aspart  0-9 Units Subcutaneous TID WC  . insulin glargine  12 Units Subcutaneous QHS  . polyethylene glycol  17 g Oral Daily  . senna  1 tablet Oral Daily  . sodium chloride  3 mL Intravenous Q12H  . temazepam  7.5 mg Oral QHS  . DISCONTD: insulin aspart  0-9 Units Subcutaneous Q4H     Assessment/ Plan:   Pt is a 71 y.o. yo female with CKD stage III, insulin dependent DM, and A fib who presented with an acute kidney injury, acidosis,  hyperglycemia, and profuse diarrhea.   Acute Kidney Injury: The patient's creatinine was 1.8 when measured as an outpatient in March. It was then 2.24 on 01/12/12 and [facility] and increased to 5.5 upon admission to [facility]. Her BUN at admission was also elevated to 93 from 22 on 01/12/12. FeNa > 5. Likely intrinsic renal from ACE-I and ATN.  Creatinine, BUN, and GFR continue to improve without medication. She should not go back on an ACE-I or ARB upon discharge as she does not appear to have the mental capacity to take such a medication that could be potentially harmful. Hopefully, the diltiazem will suffice to maintain a lower BP on it's own. She does have problems with peripheral edema, so it is reasonable to start a loop diuretic in about a week. Renal will sign off. Please call if you have any further questions.   Hyperphosphatemia: Likely secondary to CKD, started phosphate binder on 9/23 - will d/c calcium acetate   Altered Mental Status: Improved from 9/22, Lyrica held 9/22;  MMSE 22/30 - continue to hold Lyrica as an outpatient unless it needs to be restarted, but be cautious with use in impaired kidney function  Anemia, Normocytic: Baseline from Feb 2013 appears to be 11.8; It was 9.2 on admission. This could be resultant from GI loss or chronic kidney disease. Fe not calculable at < 10.  - We will be given her a final dose of Nulecit today at 400 mg, no further supplementation needed     Thank you for this interesting consultation.    Si Raider Clinton Sawyer, MD 02/05/2012, 10:46 AM   I have seen and examined this patient and agree with the plan of care seen and eval.  Vol xs yet.  Start diuretics in a few days.   Cr improving. Avoid ACEI/ARB.  Ok to D/c .  Keelin Neville L 02/05/2012, 12:40 PM

## 2012-02-06 LAB — GLUCOSE, CAPILLARY
Glucose-Capillary: 128 mg/dL — ABNORMAL HIGH (ref 70–99)
Glucose-Capillary: 164 mg/dL — ABNORMAL HIGH (ref 70–99)

## 2012-02-06 LAB — PROTIME-INR: Prothrombin Time: 13.9 seconds (ref 11.6–15.2)

## 2012-02-06 LAB — URINE CULTURE

## 2012-02-06 MED ORDER — ALPRAZOLAM 0.25 MG PO TABS: 0.2500 mg | ORAL_TABLET | Freq: Three times a day (TID) | ORAL | Status: DC

## 2012-02-06 NOTE — Progress Notes (Signed)
Patient ID: Tina [name], female   DOB: 1940/07/03, 71 y.o.   MRN: [id number] [facility] Teaching Service  Pager [phone]  Subjective: Patient had no overnight events. She looks well this morning. She is awake in bed and has eaten her breakfast. She was talkative and made conversation about the weather and asking where people go when they leave here. She reported she was doing well and had a small BM yesterday afternoon.  Her son was an issue today. Screaming at social worker about who has power of attorney. Security had to  intervene.   Objective: Vital signs in last 24 hours: Temp:  [97.7 F (36.5 C)-98.3 F (36.8 C)] 97.9 F (36.6 C) (09/27 0500) Pulse Rate:  [90-98] 90  (09/27 0500) Resp:  [20-22] 22  (09/26 1715) BP: (144-156)/(54-87) 155/71 mmHg (09/27 1025) SpO2:  [95 %-99 %] 99 % (09/27 0500) Weight change:  Last BM Date: 02/05/12   Intake/Output Summary (Last 24 hours) at 02/06/12 1100 Last data filed at 02/06/12 0700  Gross per 24 hour  Intake    720 ml  Output      0 ml  Net    720 ml   Weight change:   Physical Exam:  General: More alert this morning. Talkative. Oriented to person, town, birth date, place and month. Still confused on year.  Cardiovascular: RRR, 1/6 SM. Respiratory: nonlabored, CTAB. No increased WOB. No wheezing, rales or rhonchi.  Abdomen: obese. NT. ND. no distinct masses, no rebound or guarding, BS active  Extremities: 1+ pitting edema bilaterally- improving. TTP on bilateral LE. Skin: no ecchymosis noted. Bilateral heals bandaged today with Kerlix dressing.  Large [name] of irritation on her sacral [name] is not dressed today. Collagenase ointment applied. Nursing reports she is incontinent at times and the dressing was getting saturated with urine. Skin breakdown in a linear fashion noted on the right buttocks near midline. Bruising and skin breakdown over sacral region with erythema.  noted over sacral [name]. Breakdown/ulseration on the  right buttock/thigh junction.  Neuro: alert, oriented. Normal speech.  Lab Results:  [facility] 02/04/12 2244 02/04/12 0537  WBC 6.4 6.0  HGB 10.1* 9.8*  HCT 31.1* 30.1*  PLT 169 150   BMET  Basename 02/04/12 2244 02/04/12 0537  NA 141 141  K 4.8 5.0  CL 105 105  CO2 24 24  GLUCOSE 155* 154*  BUN 54* 66*  CREATININE 2.52* 3.18*  CALCIUM 8.9 8.3*   INR: 1.09  Medications:  Scheduled:    . acetaminophen  650 mg Oral Q6H  . ALPRAZolam  0.25 mg Oral TID  . antiseptic oral rinse  15 mL Mouth Rinse BID  . aspirin  81 mg Oral Daily  . citalopram  20 mg Oral Daily  . collagenase   Topical Daily  . diltiazem  120 mg Oral Daily  . ferric gluconate (FERRLECIT/NULECIT) IV  400 mg Intravenous Once  . insulin aspart  0-5 Units Subcutaneous QHS  . insulin aspart  0-9 Units Subcutaneous TID WC  . insulin glargine  12 Units Subcutaneous QHS  . polyethylene glycol  17 g Oral Daily  . senna  1 tablet Oral Daily  . sodium chloride  3 mL Intravenous Q12H  . temazepam  7.5 mg Oral QHS   Continuous:    . dextrose 5 % and 0.45% NaCl 10 mL (02/01/12 1403)    Assessment/Plan: Ixayana Dransfield is a 71 y.o. year old female with a history of DM, CKD stage III, HTN, AF  presenting with diarrhea/abdominal pain, weakness and found to have hyperglycemia, and acute on chronic kidney injury with mild acidosis.   1. Abdominal pain/diarrhea: -  CT on admission: non-obstructing left renal stones not likely contributory. She does have a large stool burden.  - Pain: No narcotics for pain --> AMS and hallucinations - Senna, Miralax for stool burden; Stool yesterday afternoon.  2. Acute on chronic renal failure.  - Last cr in epic is 1.8 prior to admission;5.19-->  4.66--> 4.54-->3.18--> 2.52 - Wax/wane mental status; delirium with underlying dementia--> no overnight events ( No opioids given) - renal recs: They have signed off  - No IVF  - Discontinue ACE given possible contribution to renal  failure  - Discontinue Lyrica  - Consider adding a Loop diuretic in a week; if edema is not improving.  3. Acidosis. Mild metabolic with pH 5.62 on admission -->resolved   4. Insomnia.  - Restarted 1/2 Xanax home med to prevent withdraw--> held through the night d/t confusion  - restarted 1/2 Restoril home med to prevent withdraw --> held through the night d/t confusion  5. Diabetes with uncontrolled hyperglycemia. Likely from infection vs medication noncompliance. -  Insulin gtt successful with CBGs < 200 since admission. -  lantus 12 units (home dose 24 units) with sensitive SS/HS/AC in setting of renal failure.   6. HTN. At goal. Continue dilt. Hold home ACEi and torsemide.  7. Atrial fibrillation. Rate controlled. Continue diltiazem. Hold xarelto with renal failure currently. - Daily INR. Consider heparin bridge, CHADs score 2.  8. Skin breakdown: Present since her previous hospitalization, patient states she was seen at [facility]ound care center in last 2 weeks for heals and sacrum. Will continue wound care.  9. Altered Mental Status: - UA: Triple phosphate crystals, moderate WBC, few bacteria-->NG - Electrolytes: Slightly hypocalcemic; otherwise WNL - Received Restoril and Xanax last night with no confusion. - Confusion episodes likely due to opioids.  10. FEN/GI: - KVO'd - Diet: carb modified  - zofran prn.  11. Psych: - Capacity evaluation--> Lack of capacity - Consult note started today.  - Thank you Psych for consulting with [place] on the care of Mrs. Canady.   12. Prophylaxis: heparin sq, consider full bridge with borderline CHADs score.   13. Disposition: Needs SNF placement, patient agreeable to placement.  PT/OT ordered with weakness. Probable placement today.  14. Code Status: patient desires full code.      LOS: 6 days   Felix Pacini PGY-1 02/06/2012, 11:00 AM

## 2012-02-06 NOTE — Progress Notes (Addendum)
Clinical Child psychotherapist (CSW) received several calls this morning from pt son and pt niece Florina Ou.   Pt voicemail to CSW stated that if pt nieces became HCPOA then son would, "take it out on the hospital." CSW contacted the unit immediately and security was notified.  CSW returned pt son call and he appeared very upset and tearful. Pt son stated he was told by niece that nieces would become HCPOA. CSW informed pt son that CSW would not complete any HCPOA paperwork with pt because pt capacity has been questionable. CSW informed pt son that if pt does not have capacity then decision making would go to him. CSW did also inform him that if pt confusion clears then pt can designate whomever she decides, including nieces.   CSW informed pt niece Lanice Schwab of the same information. CSW encouraged both parties to handle legal matters outside of the hospital.   Theresia Bough, MSW, [place] [phone]

## 2012-02-06 NOTE — Progress Notes (Signed)
D/c orders received;IV removed with gauze on, pt remains in stable condition, pt meds and instructions reviewed and given to pt and pts son; pt d/c to SNF, taken via ambulance

## 2012-02-06 NOTE — Progress Notes (Signed)
I discussed with Dr Kuneff.  I agree with their plans documented in their progress note for today.  

## 2012-02-06 NOTE — Discharge Summary (Signed)
I have seen and examined this patient. I have discussed with Dr Kuneff.  I agree with their findings and plans as documented in their discharge note.    

## 2012-02-06 NOTE — Progress Notes (Signed)
Clinical Social Worker (CSW) received authorization from pt insurance [id number]). CSW prepared and placed pt dc packet in pt shadow chart. CSW informed both pt son and pt niece Misty. Pt aware and agreeable. CSW has contacted [facility] for transportation.  CSW signing off.  Theresia Bough, MSW, [facility] [phone]

## 2012-02-06 NOTE — Progress Notes (Signed)
Inpatient Diabetes Program Recommendations  AACE/ADA: New Consensus Statement on Inpatient Glycemic Control (2013)  Target Ranges:  Prepandial:   less than 140 mg/dL      Peak postprandial:   less than 180 mg/dL (1-2 hours)      Critically ill patients:  140 - 180 mg/dL   Elevated post prandial CBGs.    Inpatient Diabetes Program Recommendations Correction (SSI): . Insulin - Meal Coverage: Add Novolog 3 units TID with meals   Thank you  Piedad Climes [facility] Inpatient Diabetes Coordinator [phone]

## 2012-02-06 NOTE — Consult Note (Addendum)
Patient Identification:  Tina [name] Date of Evaluation:  02/06/2012 and [address]   History of Present Illness: Pt was admitted for diarrhea, hyperglycemia, dehydration and conditions of AFI, DM, CKD III. HTN and DM.  Past Psychiatric History:Not reported but she has severe oral buccal dyskinesia, suspect use of primary antipsychotic medications    Past Medical History:     Past Medical History  Diagnosis Date  . Normal exercise sestamibi stress test 2007    [facility]  . Diabetes mellitus   . Hyperlipidemia   . Hypertension   . Heart murmur   . Chronic kidney disease     CKD stage 3  . Depression   . Stroke 06/2011    TIA        Past Surgical History  Procedure Date  . Bladder surgery 1994  . Knee surgery as child  . Abdominal hysterectomy     Allergies:  Allergies  Allergen Reactions  . Morphine And Related     Current Medications:  Prior to Admission medications   Medication Sig Start Date End Date Taking? Authorizing Provider  citalopram (CELEXA) 20 MG tablet Take 20 mg by mouth daily.   Yes Historical Provider, MD  diclofenac sodium (VOLTAREN) 1 % GEL Apply 1 application topically 4 (four) times daily as needed. As needed for pain 10/03/11  Yes Carney Living, MD  insulin aspart (NOVOLOG) 100 UNIT/ML injection Inject 6-8 Units into the skin 3 (three) times daily before meals. Inject 6 units in AM, 8 units at lunch, and 6 units at dinner 09/23/11  Yes Carney Living, MD  insulin glargine (LANTUS) 100 UNIT/ML injection Inject 24 Units into the skin at bedtime. 07/28/11  Yes Carney Living, MD  meclizine (ANTIVERT) 12.5 MG tablet Take 12.5 mg by mouth 3 (three) times daily as needed. For dizziness   Yes Historical Provider, MD  acetaminophen (TYLENOL) 325 MG tablet Take 2 tablets (650 mg total) by mouth every 6 (six) hours. 02/05/12   Renee A Kuneff, DO  ALPRAZolam (XANAX) 0.25 MG tablet Take 1 tablet (0.25 mg total) by mouth 3 (three) times daily. 02/05/12    Renee A Kuneff, DO  collagenase (SANTYL) ointment Apply topically daily. 02/05/12   Renee A Kuneff, DO  diltiazem (TIAZAC) 120 MG 24 hr capsule Take 1 capsule (120 mg total) by mouth daily. 02/05/12   Renee A Kuneff, DO  temazepam (RESTORIL) 7.5 MG capsule Take 1 capsule (7.5 mg total) by mouth at bedtime as needed for sleep. 02/05/12   Renee A Claiborne Billings, DO    Social History:    reports that she has never smoked. She has never used smokeless tobacco. She reports that she does not drink alcohol or use illicit drugs.   Family History:    Family History  Problem Relation Age of Onset  . Coronary artery disease Mother   . Diabetes Mother   . Heart disease Mother   . Diabetes Sister   . Hypertension Sister   . Cancer Father    Status Examination/Evaluation: Objective:  Appearance: Fairly Groomed  Psychomotor Activity:  EPS  Eye Contact::  Good  Speech:  Clear and Coherent  Volume:  Normal  Mood:  Anxious and Euthymic  Affect:  Appropriate  Thought Process:  Coherent  Orientation:  Full  Thought Content:  Focused on getting well and leaving hospital   Suicidal Thoughts:  No  Homicidal Thoughts:  No  Judgement:  Fair  Insight:  Fair    DIAGNOSIS:  AXIS I   early dementia (symptoms of past and a psychotic treatment suggestive of a psychotic diagnosis not defined at this time with these records)   AXIS II  Deffered  AXIS III See medical notes.  AXIS IV economic problems, other psychosocial or environmental problems, problems with access to health care services and Complex medical problems  AXIS V 51-60 moderate symptoms   Assessment/Plan:  Discussed with Psych CSW 9/25,26/2013 Pt is seen 9/25 Wed and 9/26Thurs.  Pt is sitting in chair.  She is awake and alert.  She has good eye contact, involuntary movement.  She offers social greeting  She is sitting in chair both visits.  Initially, Wed. Exam is postponed for other treatment scheduled.  Today (Thurs.)  She is calm with  good eye contact.  She responds appropriately with no bizarre comments.  She says today's date is May 26, 2001.  She says she has ten children, 3 grandchildren, 3 great-grandchildren.  She says she has no pain.  She was not able to interpret a simple proverb.  She denies suicidal ideation.  She inticates she has no location to stay.  She is able to agee she needs help.  It is suggested she would get help by going to a place where nurses and aids would help her.  She was calm, and focused on conversation. She thanked MD for visit.  There is no current medication that would contribute to the oral buccal dyskinesia. She does not describe to visual or auditory hallucinations, or suicidal/homicidal thoughts. However she is disoriented to time and more importantly is not aware of her comprehensive list of medical problems and the associated medications needed for each conditions. RECOMMENDATION:  1. This patient has some cognitive ability intact. However, she is not completely oriented and not able to demonstrate capacity to understand the full impact of her illnesses and need to treat. 2. She has significant tardive dyskinesia that at this point may not be reversed. There is no supporting history to explain or define the treatment she is received in the past but apparently the most likely conclusion is that she is received first-generation antipsychotic medication 3. Although she has some distortion and cognitive ability, she is aware of person place and situation. She is agreeable to have help in some capacity. 4.  It is recommended that this patient be transferred to a SNF are comparable facility. In reviewing all PT notes it is apparent she has some physical limitations and would be at risk to be sent home alone. 5.  Patient is aware and agreeable to transfer to another facility to provide care. 6. No further psychiatric needs at this time. M.D. psychiatrist signs off Haadiya Frogge J. Ferol Luz, MD Psychiatrist    02/06/2012 1:16 AM

## 2012-02-06 NOTE — Progress Notes (Signed)
Dressing change done bilateral to ankles, pt tolerated well

## 2012-02-07 LAB — CULTURE, BLOOD (ROUTINE X 2): Culture: NO GROWTH

## 2012-02-11 ENCOUNTER — Encounter: Payer: Medicare HMO | Admitting: Family Medicine

## 2012-02-11 NOTE — Progress Notes (Signed)
This encounter was created in error - please disregard.  This encounter was created in error - please disregard.

## 2012-02-24 ENCOUNTER — Encounter: Payer: Self-pay | Admitting: Nurse Practitioner

## 2012-02-24 ENCOUNTER — Encounter: Payer: Self-pay | Admitting: Cardiothoracic Surgery

## 2012-02-27 ENCOUNTER — Emergency Department (HOSPITAL_COMMUNITY): Payer: Medicare HMO | Admitting: Anesthesiology

## 2012-02-27 ENCOUNTER — Other Ambulatory Visit: Payer: Self-pay | Admitting: Urology

## 2012-02-27 ENCOUNTER — Encounter (HOSPITAL_COMMUNITY): Payer: Self-pay | Admitting: Urology

## 2012-02-27 ENCOUNTER — Encounter (HOSPITAL_COMMUNITY): Payer: Self-pay | Admitting: *Deleted

## 2012-02-27 ENCOUNTER — Inpatient Hospital Stay (HOSPITAL_COMMUNITY)
Admission: EM | Admit: 2012-02-27 | Discharge: 2012-03-10 | DRG: 669 | Disposition: A | Discharge status: Skilled Nursing Facility | Payer: Medicare HMO | Attending: Urology | Admitting: Urology

## 2012-02-27 ENCOUNTER — Encounter (HOSPITAL_COMMUNITY): Payer: Self-pay | Admitting: Anesthesiology

## 2012-02-27 ENCOUNTER — Encounter (HOSPITAL_COMMUNITY)
Admission: EM | Disposition: A | Discharge status: Skilled Nursing Facility | Payer: Self-pay | Source: Home / Self Care | Attending: Urology

## 2012-02-27 ENCOUNTER — Emergency Department (HOSPITAL_COMMUNITY): Payer: Medicare HMO

## 2012-02-27 DIAGNOSIS — N179 Acute kidney failure, unspecified: Secondary | ICD-10-CM

## 2012-02-27 DIAGNOSIS — G4700 Insomnia, unspecified: Secondary | ICD-10-CM | POA: Diagnosis present

## 2012-02-27 DIAGNOSIS — N210 Calculus in bladder: Secondary | ICD-10-CM | POA: Diagnosis present

## 2012-02-27 DIAGNOSIS — I129 Hypertensive chronic kidney disease with stage 1 through stage 4 chronic kidney disease, or unspecified chronic kidney disease: Secondary | ICD-10-CM | POA: Diagnosis present

## 2012-02-27 DIAGNOSIS — R197 Diarrhea, unspecified: Secondary | ICD-10-CM

## 2012-02-27 DIAGNOSIS — A419 Sepsis, unspecified organism: Secondary | ICD-10-CM

## 2012-02-27 DIAGNOSIS — I4891 Unspecified atrial fibrillation: Secondary | ICD-10-CM

## 2012-02-27 DIAGNOSIS — N201 Calculus of ureter: Secondary | ICD-10-CM

## 2012-02-27 DIAGNOSIS — N132 Hydronephrosis with renal and ureteral calculous obstruction: Secondary | ICD-10-CM | POA: Diagnosis present

## 2012-02-27 DIAGNOSIS — D631 Anemia in chronic kidney disease: Secondary | ICD-10-CM | POA: Diagnosis present

## 2012-02-27 DIAGNOSIS — R4182 Altered mental status, unspecified: Secondary | ICD-10-CM

## 2012-02-27 DIAGNOSIS — N3941 Urge incontinence: Secondary | ICD-10-CM

## 2012-02-27 DIAGNOSIS — F329 Major depressive disorder, single episode, unspecified: Secondary | ICD-10-CM | POA: Diagnosis present

## 2012-02-27 DIAGNOSIS — B9689 Other specified bacterial agents as the cause of diseases classified elsewhere: Secondary | ICD-10-CM | POA: Diagnosis present

## 2012-02-27 DIAGNOSIS — I5189 Other ill-defined heart diseases: Secondary | ICD-10-CM

## 2012-02-27 DIAGNOSIS — Z6841 Body Mass Index (BMI) 40.0 and over, adult: Secondary | ICD-10-CM

## 2012-02-27 DIAGNOSIS — E785 Hyperlipidemia, unspecified: Secondary | ICD-10-CM | POA: Diagnosis present

## 2012-02-27 DIAGNOSIS — F3289 Other specified depressive episodes: Secondary | ICD-10-CM | POA: Diagnosis present

## 2012-02-27 DIAGNOSIS — E118 Type 2 diabetes mellitus with unspecified complications: Secondary | ICD-10-CM

## 2012-02-27 DIAGNOSIS — N200 Calculus of kidney: Secondary | ICD-10-CM | POA: Insufficient documentation

## 2012-02-27 DIAGNOSIS — R5381 Other malaise: Secondary | ICD-10-CM | POA: Diagnosis present

## 2012-02-27 DIAGNOSIS — N3020 Other chronic cystitis without hematuria: Secondary | ICD-10-CM

## 2012-02-27 DIAGNOSIS — N12 Tubulo-interstitial nephritis, not specified as acute or chronic: Principal | ICD-10-CM

## 2012-02-27 DIAGNOSIS — I10 Essential (primary) hypertension: Secondary | ICD-10-CM

## 2012-02-27 DIAGNOSIS — N039 Chronic nephritic syndrome with unspecified morphologic changes: Secondary | ICD-10-CM | POA: Diagnosis present

## 2012-02-27 DIAGNOSIS — N1330 Unspecified hydronephrosis: Secondary | ICD-10-CM

## 2012-02-27 DIAGNOSIS — N183 Chronic kidney disease, stage 3 unspecified: Secondary | ICD-10-CM

## 2012-02-27 DIAGNOSIS — R159 Full incontinence of feces: Secondary | ICD-10-CM

## 2012-02-27 DIAGNOSIS — Q649 Congenital malformation of urinary system, unspecified: Secondary | ICD-10-CM

## 2012-02-27 DIAGNOSIS — I4892 Unspecified atrial flutter: Secondary | ICD-10-CM | POA: Diagnosis not present

## 2012-02-27 HISTORY — DX: Hydronephrosis with renal and ureteral calculous obstruction: N13.2

## 2012-02-27 HISTORY — DX: Unspecified atrial fibrillation: I48.91

## 2012-02-27 HISTORY — DX: Calculus of kidney: N20.0

## 2012-02-27 HISTORY — DX: Unspecified atrial flutter: I48.92

## 2012-02-27 HISTORY — DX: Tubulo-interstitial nephritis, not specified as acute or chronic: N12

## 2012-02-27 HISTORY — DX: Urge incontinence: N39.41

## 2012-02-27 HISTORY — PX: CYSTOSCOPY W/ URETERAL STENT PLACEMENT: SHX1429

## 2012-02-27 HISTORY — DX: Full incontinence of feces: R15.9

## 2012-02-27 LAB — PROTIME-INR
INR: 1.04 (ref 0.00–1.49)
Prothrombin Time: 13.5 seconds (ref 11.6–15.2)

## 2012-02-27 LAB — TROPONIN I: Troponin I: <0.3 ng/mL (ref <0.30)

## 2012-02-27 LAB — CBC WITH DIFFERENTIAL/PLATELET
Basophils Absolute: 0 10*3/uL (ref 0.0–0.1)
Basophils Relative: 0 % (ref 0–1)
Eosinophils Absolute: 0.1 10*3/uL (ref 0.0–0.7)
Eosinophils Relative: 1 % (ref 0–5)
Lymphs Abs: 1.5 10*3/uL (ref 0.7–4.0)
MCH: 28.1 pg (ref 26.0–34.0)
Neutrophils Relative %: 79 % — ABNORMAL HIGH (ref 43–77)
Platelets: 142 10*3/uL — ABNORMAL LOW (ref 150–400)
RBC: 4.38 MIL/uL (ref 3.87–5.11)
RDW: 15.1 % (ref 11.5–15.5)

## 2012-02-27 LAB — URINALYSIS, ROUTINE W REFLEX MICROSCOPIC
Glucose, UA: 100 mg/dL — AB
Glucose, UA: 100 mg/dL — AB
Ketones, ur: NEGATIVE mg/dL
Nitrite: POSITIVE — AB
Protein, ur: 30 mg/dL — AB
Urobilinogen, UA: 0.2 mg/dL (ref 0.0–1.0)
pH: 5 (ref 5.0–8.0)

## 2012-02-27 LAB — BASIC METABOLIC PANEL
BUN: 31 mg/dL — ABNORMAL HIGH (ref 6–23)
CO2: 19 mEq/L (ref 19–32)
Chloride: 104 mEq/L (ref 96–112)
Potassium: 4.7 mEq/L (ref 3.5–5.1)

## 2012-02-27 LAB — HEPATIC FUNCTION PANEL
Bilirubin, Direct: 0.1 mg/dL (ref 0.0–0.3)
Indirect Bilirubin: 0.4 mg/dL (ref 0.3–0.9)
Total Bilirubin: 0.5 mg/dL (ref 0.3–1.2)

## 2012-02-27 LAB — URINE MICROSCOPIC-ADD ON

## 2012-02-27 LAB — APTT: aPTT: 27 seconds (ref 24–37)

## 2012-02-27 LAB — LACTIC ACID, PLASMA: Lactic Acid, Venous: 1.8 mmol/L (ref 0.5–2.2)

## 2012-02-27 SURGERY — CYSTOSCOPY, WITH RETROGRADE PYELOGRAM AND URETERAL STENT INSERTION
Anesthesia: Monitor Anesthesia Care | Site: Pelvis | Laterality: Left | Wound class: Dirty or Infected

## 2012-02-27 MED ORDER — CITALOPRAM HYDROBROMIDE 20 MG PO TABS
20.0000 mg | ORAL_TABLET | Freq: Every day | ORAL | Status: DC
  Administered 2012-02-27 – 2012-03-10 (×13): 20 mg via ORAL
  Filled 2012-02-27 (×13): qty 1

## 2012-02-27 MED ORDER — LACTATED RINGERS IV SOLN: INTRAVENOUS | Status: DC

## 2012-02-27 MED ORDER — HYDROMORPHONE HCL PF 1 MG/ML IJ SOLN
1.0000 mg | Freq: Once | INTRAMUSCULAR | Status: AC
  Administered 2012-02-27: 1 mg via INTRAVENOUS
  Filled 2012-02-27: qty 1

## 2012-02-27 MED ORDER — PREGABALIN 75 MG PO CAPS
75.0000 mg | ORAL_CAPSULE | Freq: Two times a day (BID) | ORAL | Status: DC
  Administered 2012-02-27 – 2012-03-10 (×23): 75 mg via ORAL
  Filled 2012-02-27 (×24): qty 1

## 2012-02-27 MED ORDER — VITAMIN D3 25 MCG (1000 UNIT) PO TABS
1000.0000 [IU] | ORAL_TABLET | Freq: Every day | ORAL | Status: DC
  Administered 2012-02-27 – 2012-03-10 (×13): 1000 [IU] via ORAL
  Filled 2012-02-27 (×13): qty 1

## 2012-02-27 MED ORDER — ONDANSETRON HCL 4 MG/2ML IJ SOLN
4.0000 mg | Freq: Once | INTRAMUSCULAR | Status: AC
  Administered 2012-02-27: 4 mg via INTRAVENOUS

## 2012-02-27 MED ORDER — LIDOCAINE HCL 2 % EX GEL
CUTANEOUS | Status: AC
  Filled 2012-02-27: qty 10

## 2012-02-27 MED ORDER — FENTANYL CITRATE 0.05 MG/ML IJ SOLN: 25.0000 ug | INTRAMUSCULAR | Status: DC | PRN

## 2012-02-27 MED ORDER — DIATRIZOATE MEGLUMINE 30 % UR SOLN
URETHRAL | Status: DC | PRN
  Administered 2012-02-27: 300 mL via URETHRAL

## 2012-02-27 MED ORDER — IOHEXOL 300 MG/ML  SOLN
75.0000 mL | Freq: Once | INTRAMUSCULAR | Status: AC | PRN
  Administered 2012-02-27: 75 mL via INTRAVENOUS

## 2012-02-27 MED ORDER — BELLADONNA ALKALOIDS-OPIUM 16.2-60 MG RE SUPP
RECTAL | Status: AC
  Filled 2012-02-27: qty 1

## 2012-02-27 MED ORDER — SODIUM CHLORIDE 0.9 % IV SOLN
Freq: Once | INTRAVENOUS | Status: AC
  Administered 2012-02-27: 12:00:00 via INTRAVENOUS

## 2012-02-27 MED ORDER — IOHEXOL 300 MG/ML  SOLN
INTRAMUSCULAR | Status: AC
  Filled 2012-02-27: qty 1

## 2012-02-27 MED ORDER — ONDANSETRON HCL 4 MG/2ML IJ SOLN: 4.0000 mg | INTRAMUSCULAR | Status: DC | PRN

## 2012-02-27 MED ORDER — IOHEXOL 300 MG/ML  SOLN
20.0000 mL | INTRAMUSCULAR | Status: AC
  Administered 2012-02-27 (×2): 20 mL via ORAL

## 2012-02-27 MED ORDER — 0.9 % SODIUM CHLORIDE (POUR BTL) OPTIME
TOPICAL | Status: DC | PRN
  Administered 2012-02-27: 1000 mL

## 2012-02-27 MED ORDER — SODIUM CHLORIDE 0.9 % IV SOLN
3.0000 g | Freq: Once | INTRAVENOUS | Status: AC
  Administered 2012-02-27: 3 g via INTRAVENOUS
  Filled 2012-02-27: qty 3

## 2012-02-27 MED ORDER — HYDROMORPHONE HCL PF 1 MG/ML IJ SOLN
0.2500 mg | INTRAMUSCULAR | Status: DC | PRN
  Administered 2012-02-27: 0.25 mg via INTRAVENOUS

## 2012-02-27 MED ORDER — INSULIN GLARGINE 100 UNIT/ML ~~LOC~~ SOLN
24.0000 [IU] | Freq: Every day | SUBCUTANEOUS | Status: DC
  Administered 2012-02-28 – 2012-03-09 (×11): 24 [IU] via SUBCUTANEOUS

## 2012-02-27 MED ORDER — PROMETHAZINE HCL 25 MG/ML IJ SOLN: 6.2500 mg | INTRAMUSCULAR | Status: DC | PRN

## 2012-02-27 MED ORDER — CIPROFLOXACIN IN D5W 200 MG/100ML IV SOLN
200.0000 mg | Freq: Two times a day (BID) | INTRAVENOUS | Status: DC
  Administered 2012-02-27 – 2012-02-29 (×5): 200 mg via INTRAVENOUS
  Filled 2012-02-27 (×6): qty 100

## 2012-02-27 MED ORDER — DOCUSATE SODIUM 100 MG PO CAPS
100.0000 mg | ORAL_CAPSULE | Freq: Two times a day (BID) | ORAL | Status: DC
  Administered 2012-02-27 – 2012-03-08 (×9): 100 mg via ORAL
  Filled 2012-02-27 (×25): qty 1

## 2012-02-27 MED ORDER — ACETAMINOPHEN 325 MG PO TABS
650.0000 mg | ORAL_TABLET | ORAL | Status: DC | PRN
  Administered 2012-02-28 – 2012-03-09 (×4): 650 mg via ORAL
  Filled 2012-02-27 (×4): qty 2

## 2012-02-27 MED ORDER — POTASSIUM CHLORIDE IN NACL 20-0.45 MEQ/L-% IV SOLN
INTRAVENOUS | Status: DC
  Administered 2012-02-27 – 2012-03-01 (×5): via INTRAVENOUS
  Filled 2012-02-27 (×8): qty 1000

## 2012-02-27 MED ORDER — DEXTROSE 5 % IV SOLN
1.0000 g | Freq: Once | INTRAVENOUS | Status: AC
  Administered 2012-02-27: 1 g via INTRAVENOUS
  Filled 2012-02-27: qty 10

## 2012-02-27 MED ORDER — ONDANSETRON HCL 4 MG/2ML IJ SOLN
4.0000 mg | Freq: Once | INTRAMUSCULAR | Status: AC
  Administered 2012-02-27: 4 mg via INTRAVENOUS
  Filled 2012-02-27: qty 2

## 2012-02-27 MED ORDER — MECLIZINE HCL 12.5 MG PO TABS
12.5000 mg | ORAL_TABLET | Freq: Three times a day (TID) | ORAL | Status: DC | PRN
  Filled 2012-02-27: qty 1

## 2012-02-27 MED ORDER — ACETAMINOPHEN 325 MG RE SUPP: 325.0000 mg | Freq: Once | RECTAL | Status: DC

## 2012-02-27 MED ORDER — FENTANYL CITRATE 0.05 MG/ML IJ SOLN
25.0000 ug | INTRAMUSCULAR | Status: DC | PRN
  Administered 2012-03-04: 25 ug via INTRAVENOUS
  Filled 2012-02-27: qty 2

## 2012-02-27 MED ORDER — INSULIN ASPART 100 UNIT/ML ~~LOC~~ SOLN
0.0000 [IU] | Freq: Three times a day (TID) | SUBCUTANEOUS | Status: DC
  Administered 2012-02-28 (×3): 3 [IU] via SUBCUTANEOUS
  Administered 2012-02-29 – 2012-03-01 (×4): 2 [IU] via SUBCUTANEOUS
  Administered 2012-03-02: 8 [IU] via SUBCUTANEOUS
  Administered 2012-03-03: 3 [IU] via SUBCUTANEOUS
  Administered 2012-03-03: 5 [IU] via SUBCUTANEOUS
  Administered 2012-03-04: 3 [IU] via SUBCUTANEOUS
  Administered 2012-03-05: 11 [IU] via SUBCUTANEOUS
  Administered 2012-03-05: 3 [IU] via SUBCUTANEOUS
  Administered 2012-03-06 (×2): 2 [IU] via SUBCUTANEOUS
  Administered 2012-03-06: 3 [IU] via SUBCUTANEOUS
  Administered 2012-03-07: 2 [IU] via SUBCUTANEOUS
  Administered 2012-03-07: 5 [IU] via SUBCUTANEOUS
  Administered 2012-03-08: 3 [IU] via SUBCUTANEOUS
  Administered 2012-03-09: 5 [IU] via SUBCUTANEOUS
  Administered 2012-03-09 – 2012-03-10 (×2): 3 [IU] via SUBCUTANEOUS

## 2012-02-27 MED ORDER — FENTANYL CITRATE 0.05 MG/ML IJ SOLN
INTRAMUSCULAR | Status: AC
  Filled 2012-02-27: qty 2

## 2012-02-27 MED ORDER — FENTANYL CITRATE 0.05 MG/ML IJ SOLN
INTRAMUSCULAR | Status: DC | PRN
  Administered 2012-02-27: 50 ug via INTRAVENOUS

## 2012-02-27 MED ORDER — ADULT MULTIVITAMIN W/MINERALS CH
1.0000 | ORAL_TABLET | Freq: Every day | ORAL | Status: DC
  Administered 2012-02-27 – 2012-03-10 (×13): 1 via ORAL
  Filled 2012-02-27 (×13): qty 1

## 2012-02-27 MED ORDER — SODIUM CHLORIDE 0.9 % IR SOLN
Status: DC | PRN
  Administered 2012-02-27: 3000 mL

## 2012-02-27 MED ORDER — ALPRAZOLAM 0.25 MG PO TABS
0.2500 mg | ORAL_TABLET | Freq: Every day | ORAL | Status: DC
  Administered 2012-02-27 – 2012-03-10 (×11): 0.25 mg via ORAL
  Filled 2012-02-27 (×12): qty 1

## 2012-02-27 MED ORDER — HYOSCYAMINE SULFATE 0.125 MG SL SUBL
0.1250 mg | SUBLINGUAL_TABLET | SUBLINGUAL | Status: DC | PRN
  Administered 2012-02-28: 0.125 mg via ORAL
  Filled 2012-02-27 (×2): qty 1

## 2012-02-27 MED ORDER — TRAMADOL HCL 50 MG PO TABS
50.0000 mg | ORAL_TABLET | Freq: Four times a day (QID) | ORAL | Status: DC | PRN
  Administered 2012-02-28 – 2012-03-09 (×9): 50 mg via ORAL
  Filled 2012-02-27 (×10): qty 1

## 2012-02-27 MED ORDER — SODIUM CHLORIDE 0.9 % IV SOLN
1.5000 g | Freq: Three times a day (TID) | INTRAVENOUS | Status: DC
  Administered 2012-02-27 – 2012-03-01 (×8): 1.5 g via INTRAVENOUS
  Filled 2012-02-27 (×9): qty 1.5

## 2012-02-27 MED ORDER — ACETAMINOPHEN 650 MG RE SUPP
650.0000 mg | Freq: Once | RECTAL | Status: AC
  Administered 2012-02-27: 650 mg via RECTAL
  Filled 2012-02-27: qty 1

## 2012-02-27 MED ORDER — DICLOFENAC SODIUM 1 % TD GEL
2.0000 g | Freq: Four times a day (QID) | TRANSDERMAL | Status: DC | PRN
  Filled 2012-02-27: qty 100

## 2012-02-27 MED ORDER — LACTATED RINGERS IV SOLN
INTRAVENOUS | Status: DC | PRN
  Administered 2012-02-27: 19:00:00 via INTRAVENOUS

## 2012-02-27 MED ORDER — PROPOFOL 10 MG/ML IV BOLUS
INTRAVENOUS | Status: DC | PRN
  Administered 2012-02-27 (×2): 20 mg via INTRAVENOUS
  Administered 2012-02-27: 30 mg via INTRAVENOUS
  Administered 2012-02-27: 10 mg via INTRAVENOUS
  Administered 2012-02-27 (×2): 20 mg via INTRAVENOUS

## 2012-02-27 MED ORDER — SODIUM CHLORIDE 0.9 % IV SOLN
INTRAVENOUS | Status: DC | PRN
  Administered 2012-02-27: 18:00:00 via INTRAVENOUS

## 2012-02-27 MED ORDER — ZOLPIDEM TARTRATE 5 MG PO TABS
5.0000 mg | ORAL_TABLET | Freq: Every evening | ORAL | Status: DC | PRN
  Administered 2012-02-28 – 2012-03-09 (×6): 5 mg via ORAL
  Filled 2012-02-27 (×6): qty 1

## 2012-02-27 MED ORDER — HYDROMORPHONE HCL PF 1 MG/ML IJ SOLN
INTRAMUSCULAR | Status: AC
  Filled 2012-02-27: qty 1

## 2012-02-27 MED ORDER — ONDANSETRON HCL 4 MG/2ML IJ SOLN
INTRAMUSCULAR | Status: AC
  Administered 2012-02-27: 4 mg via INTRAVENOUS
  Filled 2012-02-27: qty 2

## 2012-02-27 MED ORDER — SODIUM CHLORIDE 0.9 % IV BOLUS (SEPSIS)
1000.0000 mL | Freq: Once | INTRAVENOUS | Status: AC
  Administered 2012-02-27: 1000 mL via INTRAVENOUS

## 2012-02-27 MED ORDER — BISACODYL 10 MG RE SUPP: 10.0000 mg | Freq: Every day | RECTAL | Status: DC | PRN

## 2012-02-27 MED ORDER — TEMAZEPAM 7.5 MG PO CAPS: 7.5000 mg | ORAL_CAPSULE | Freq: Every evening | ORAL | Status: DC | PRN

## 2012-02-27 MED ORDER — DILTIAZEM HCL ER BEADS 120 MG PO CP24
120.0000 mg | ORAL_CAPSULE | Freq: Every day | ORAL | Status: DC
  Administered 2012-02-27 – 2012-03-04 (×7): 120 mg via ORAL
  Filled 2012-02-27 (×8): qty 1

## 2012-02-27 MED ORDER — MIDAZOLAM HCL 5 MG/5ML IJ SOLN
INTRAMUSCULAR | Status: DC | PRN
  Administered 2012-02-27: 1 mg via INTRAVENOUS

## 2012-02-27 SURGICAL SUPPLY — 16 items
BAG URINE DRAINAGE (UROLOGICAL SUPPLIES) ×2 IMPLANT
BAG URO CATCHER STRL LF (DRAPE) ×2 IMPLANT
CATH FOLEY 2WAY SLVR  5CC 18FR (CATHETERS) ×1
CATH FOLEY 2WAY SLVR 5CC 18FR (CATHETERS) ×1 IMPLANT
CATH URET 5FR 28IN OPEN ENDED (CATHETERS) ×2 IMPLANT
CLOTH BEACON ORANGE TIMEOUT ST (SAFETY) ×2 IMPLANT
DRAPE CAMERA CLOSED 9X96 (DRAPES) ×2 IMPLANT
GLIDEWIRE 0.025 SS STRAIGHT (WIRE) ×2 IMPLANT
GLOVE SURG SS PI 8.0 STRL IVOR (GLOVE) ×2 IMPLANT
GOWN PREVENTION PLUS XLARGE (GOWN DISPOSABLE) IMPLANT
GOWN STRL REIN XL XLG (GOWN DISPOSABLE) ×2 IMPLANT
GUIDEWIRE STR DUAL SENSOR (WIRE) ×2 IMPLANT
MANIFOLD NEPTUNE II (INSTRUMENTS) ×2 IMPLANT
PACK CYSTO (CUSTOM PROCEDURE TRAY) ×2 IMPLANT
STENT CONTOUR 6FRX24X.038 (STENTS) ×2 IMPLANT
TUBING CONNECTING 10 (TUBING) ×2 IMPLANT

## 2012-02-27 NOTE — ED Notes (Signed)
C/o abd pain, n/v onset this morning. Denies diarrhea. Emesis x 1 enroute to ED

## 2012-02-27 NOTE — Consult Note (Signed)
[facility] Medical Consultation  Tina [name] ZOX:[id number] DOB: [birth year] DOA: 02/27/2012 PCP: Carney Living, MD   Requesting physician: Dr Annabell Howells Date of consultation: 02/27/2012 Reason for consultation: management of medical problems.   Impression/Recommendations Active Problems:  Chronic cystitis Hypertension Diabetes mellitus Atrial fibrillation CKD Stroke Hyperlipidemia Depression Insomnia     1. Pyelonephritis/ Hydronephrosis/ calculus at Left Ureteral pelvic junction: s/p cystoscopy  With retrograde pyelogram and ureteral stent placement. Further management as per urology.  - on iv antibiotics, IV fluids, pain control.   2. Hypertension:  Sub optimally controlled, probably secondary to pain. Resume home medications and pain control. 3. Diabetes mellitus: CBG (last 3)   Basename 02/27/12 1928  GLUCAP 180*   Resume home lantus when she is on carb modified diet.  - resume 24 units of lantus at bedtime and continue with SSI.   4. Atrial fibrillation; currently rate controlled. [place] Ramp was recently stopped because of renal failure. It can be restarted at a later date as per the discretion of her PCP. Continue with diltiazem.   5. CKD: Baseline creatinine is 1.8, today's creatinine is 1.69. Continue to monitor.   I will followup again tomorrow. Please contact me if I can be of assistance in the meanwhile. Thank you for this consultation.  Chief Complaint: fever, nausea, vomiting and abdominal pain today.  HPI:  71 year old lady with multiple medical problems came in for nausea, vomiting and abdominal pain, fever, she was found to have pyelonephritis, ct of the abdomen showed hydronephrosis of th eleft kidney and a 4 mm calculus of the ureteral pelvic junction. She was seen by urologist Dr Annabell Howells, admitted her and she underwent cystoscopy and ureteral stent placement. Her pain is better controlled. She has been resumed on her diet.  Review of  Systems:  See HPI otherwise negative.   Past Medical History  Diagnosis Date  . Normal exercise sestamibi stress test 2007    [facility]  . Diabetes mellitus   . Hyperlipidemia   . Hypertension   . Heart murmur   . Chronic kidney disease     CKD stage 3  . Depression   . Stroke 06/2011    [facility]   . Atrial fibrillation   . Pyelonephritis   . Ureteral stone with hydronephrosis   . Renal stone    Past Surgical History  Procedure Date  . Bladder surgery 1994  . Knee surgery as child  . Abdominal hysterectomy    Social History:  reports that she has never smoked. She has never used smokeless tobacco. She reports that she does not drink alcohol or use illicit drugs.  Allergies  Allergen Reactions  . Morphine And Related    Family History  Problem Relation Age of Onset  . Coronary artery disease Mother   . Diabetes Mother   . Heart disease Mother   . Diabetes Sister   . Hypertension Sister   . Cancer Father     Prior to Admission medications   Medication Sig Start Date End Date Taking? Authorizing Provider  ALPRAZolam (XANAX) 0.25 MG tablet Take 1 tablet (0.25 mg total) by mouth 3 (three) times daily. 02/06/12  Yes Renee A Kuneff, DO  cholecalciferol (VITAMIN D) 1000 UNITS tablet Take 1,000 Units by mouth daily.   Yes Historical Provider, MD  citalopram (CELEXA) 20 MG tablet Take 20 mg by mouth daily.   Yes Historical Provider, MD  diltiazem (TIAZAC) 120 MG 24 hr capsule Take 1 capsule (120 mg total) by mouth  daily. 02/05/12  Yes Renee A Kuneff, DO  insulin aspart (NOVOLOG) 100 UNIT/ML injection Inject 6-8 Units into the skin 3 (three) times daily before meals. Inject 6 units in AM, 8 units at lunch, and 6 units at dinner 09/23/11  Yes Carney Living, MD  insulin glargine (LANTUS) 100 UNIT/ML injection Inject 24 Units into the skin at bedtime. 07/28/11  Yes Carney Living, MD  LYRICA 75 MG capsule Take 75 mg by mouth 2 (two) times daily. 01/24/12  Yes Historical Provider, MD    meclizine (ANTIVERT) 12.5 MG tablet Take 12.5 mg by mouth 3 (three) times daily as needed. For dizziness   Yes Historical Provider, MD  Multiple Vitamin (MULTIVITAMIN WITH MINERALS) TABS Take 1 tablet by mouth daily.   Yes Historical Provider, MD  temazepam (RESTORIL) 7.5 MG capsule Take 1 capsule (7.5 mg total) by mouth at bedtime as needed for sleep. 02/05/12  Yes Renee A Kuneff, DO  vitamin C (ASCORBIC ACID) 500 MG tablet Take 500 mg by mouth daily.   Yes Historical Provider, MD  zinc sulfate 220 MG capsule Take 220 mg by mouth daily.   Yes Historical Provider, MD  diclofenac sodium (VOLTAREN) 1 % GEL Apply 1 application topically 4 (four) times daily as needed. As needed for pain 10/03/11   Carney Living, MD   Physical Exam: Blood pressure 126/52, pulse 97, temperature 98.2 F (36.8 C), temperature source Oral, resp. rate 18, SpO2 97.00%. Filed Vitals:   02/27/12 2015 02/27/12 2030 02/27/12 2045 02/27/12 2049  BP: 126/50 112/50 126/52 126/52  Pulse: 98 97 97 97  Temp:    98.2 F (36.8 C)  TempSrc:      Resp: 16 15 16 18   SpO2: 98% 97% 98% 97%    Constitutional: Vital signs reviewed.  Patient is a well-developed and well-nourished in no acute distress and cooperative with exam.  Head: Normocephalic and atraumatic Mouth: no erythema or exudates, MMM Eyes: PERRL, EOMI, conjunctivae normal, No scleral icterus.  Neck: Supple, Trachea midline normal ROM, No JVD, mass, thyromegaly, or carotid bruit present.  Cardiovascular: RRR, S1 normal, S2 normal, no MRG, pulses symmetric and intact bilaterally Pulmonary/Chest: CTAB, no wheezes, rales, or rhonchi Abdominal: Soft. Mild generalized tenderness.  non-distended, bowel sounds are normal GU: left CVA tenderness Musculoskeletal: No joint deformities, erythema, or stiffness, ROM full and no nontender Hematology: no cervical, inginal, or axillary adenopathy.  Neurological: Alert, appears to be confused slightly,able to move all  extremities.  Skin: Warm, dry and intact. No rash, cyanosis, or clubbing.    Labs on Admission:  Basic Metabolic Panel:  Lab 02/27/12 4098  NA 139  K 4.7  CL 104  CO2 19  GLUCOSE 179*  BUN 31*  CREATININE 1.69*  CALCIUM 9.0  MG --  PHOS --   Liver Function Tests:  Lab 02/27/12 1002  AST 15  ALT 9  ALKPHOS 118*  BILITOT 0.5  PROT 7.4  ALBUMIN 3.2*   No results found for this basename: LIPASE:5,AMYLASE:5 in the last 168 hours No results found for this basename: AMMONIA:5 in the last 168 hours CBC:  Lab 02/27/12 1002  WBC 10.1  NEUTROABS 8.0*  HGB 12.3  HCT 37.0  MCV 84.5  PLT 142*   Cardiac Enzymes:  Lab 02/27/12 1002  CKTOTAL --  CKMB --  CKMBINDEX --  TROPONINI <0.30   BNP: No components found with this basename: POCBNP:5 CBG:  Lab 02/27/12 1928  GLUCAP 180*    Radiological Exams  on Admission: Ct Abdomen Pelvis W Contrast  02/27/2012  *RADIOLOGY REPORT*  Clinical Data: Lower abdominal pain and guarding.  Nausea and vomiting.  CT ABDOMEN AND PELVIS WITH CONTRAST  Technique:  Multidetector CT imaging of the abdomen and pelvis was performed following the standard protocol during bolus administration of intravenous contrast.  Contrast: 75mL OMNIPAQUE IOHEXOL 300 MG/ML  SOLN  Comparison: 02/01/2012  Findings: Moderate left hydronephrosis and left perinephric stranding is seen.  A 4 mm calculus is seen at the left ureteropelvic junction.  No other ureteral calculi or dilatation seen.  A 6 mm nonobstructing calculus is again seen in the lower pole of the left kidney.  The right kidney is normal in appearance.  Foley catheter is seen within the urinary bladder which is empty. Prior hysterectomy noted.  Adnexae are unremarkable.  Tiny calcified gallstones are seen, however there is no evidence of acute cholecystitis.  The liver, spleen, pancreas, and adrenal glands are normal appearance.  There is no evidence of lymphadenopathy.  No evidence of dilated bowel loops  or hernia.  IMPRESSION:  1.  Moderate left hydronephrosis and perinephric stranding due to 4 mm calculus at the left ureteral pelvic junction. 2.  6 mm nonobstructing calculus lower pole left kidney. 3.  Cholelithiasis, without evidence of cholecystitis.   Original Report Authenticated By: Danae Orleans, M.D.    Dg Abd Acute W/chest  02/27/2012  *RADIOLOGY REPORT*  Clinical Data: Abdominal pain.  ACUTE ABDOMEN SERIES (ABDOMEN 2 VIEW & CHEST 1 VIEW)  Comparison: 02/01/2012  Findings: Moderate stool burden throughout the colon. There is normal bowel gas pattern.  No free air.  No organomegaly or suspicious calcification.  No acute bony abnormality.  Mild cardiomegaly and vascular congestion.  Low lung volumes.  No confluent opacities or effusions.  No acute bony abnormality.  IMPRESSION: Moderate stool burden.  No bowel obstruction or free air.  Cardiomegaly, vascular congestion.   Original Report Authenticated By: Cyndie Chime, M.D.     EKG:  Not done  Time spent: 64 min  [facility] Pager [phone]  If 7PM-7AM, please contact night-coverage [email or web address] Password [email or web address] 02/27/2012, 9:05 PM

## 2012-02-27 NOTE — OR Nursing (Signed)
Patient arrived to OR with 56fr foley which was removed at 1842 02/27/12 by Minda Meo RN

## 2012-02-27 NOTE — ED Notes (Signed)
Patient transported to CT 

## 2012-02-27 NOTE — ED Notes (Signed)
[facility] called for transport to [facility] ED.

## 2012-02-27 NOTE — Brief Op Note (Signed)
02/27/2012  7:05 PM  PATIENT:  Tina [name]  72 y.o. female  PRE-OPERATIVE DIAGNOSIS:  left ureteral obstruction from UPJ stone with pyelonephritis.   POST-OPERATIVE DIAGNOSIS:  left ureteral obstruction from UPJ stone with pyelonephritis. Bladder stone.   PROCEDURE:  Procedure(s) (LRB) with comments: CYSTOSCOPY WITH RETROGRADE PYELOGRAM/URETERAL STENT PLACEMENT (Left) Bladder stone removal.   SURGEON:  Surgeon(s) and Role:    * Anner Crete, MD - Primary  PHYSICIAN ASSISTANT:   ASSISTANTS: none   ANESTHESIA:   MAC  EBL:     BLOOD ADMINISTERED:none  DRAINS: Urinary Catheter (Foley) and left 4.8x24 and left 6.24 JJ stents.    LOCAL MEDICATIONS USED:  NONE  SPECIMEN:  Source of Specimen:  bladder stone and left renal pelvic culture.   DISPOSITION OF SPECIMEN:  PATHOLOGY  COUNTS:  YES  TOURNIQUET:  * No tourniquets in log *  DICTATION: .Other Dictation: Dictation Number [id number]  PLAN OF CARE: Admit for overnight observation  PATIENT DISPOSITION:  PACU - hemodynamically stable.   Delay start of Pharmacological VTE agent (>24hrs) due to surgical blood loss or risk of bleeding: not applicable

## 2012-02-27 NOTE — ED Notes (Signed)
Pt present to [facility] From [facility] for urology consult due to kidney stone. Temp is 100.7 at the moment.

## 2012-02-27 NOTE — ED Provider Notes (Addendum)
History     CSN: [id number]  Arrival date & time 02/27/12  4401   First MD Initiated Contact with Patient 02/27/12 0940      Chief Complaint  Patient presents with  . Abdominal Pain    (Consider location/radiation/quality/duration/timing/severity/associated sxs/prior treatment) HPI Comments: Pt with hx of afib, CAD, diabetes, CKD comes in with cc of abd pain. The abd pain is sudden onset, located in the lower quadrants bilaterally, right worse than left. There is associated nausea with emesis x 5. No fevers or chills. Pt denies any UTI like sx. No diarrhea. Emesis were bilious.  Patient is a 71 y.o. female presenting with abdominal pain. The history is provided by the patient.  Abdominal Pain The primary symptoms of the illness include abdominal pain, nausea and vomiting. The primary symptoms of the illness do not include shortness of breath, diarrhea or dysuria.  Symptoms associated with the illness do not include constipation or hematuria.    Past Medical History  Diagnosis Date  . Normal exercise sestamibi stress test 2007    [facility]  . Diabetes mellitus   . Hyperlipidemia   . Hypertension   . Heart murmur   . Chronic kidney disease     CKD stage 3  . Depression   . Stroke 06/2011    [facility]   . Atrial fibrillation     Past Surgical History  Procedure Date  . Bladder surgery 1994  . Knee surgery as child  . Abdominal hysterectomy     Family History  Problem Relation Age of Onset  . Coronary artery disease Mother   . Diabetes Mother   . Heart disease Mother   . Diabetes Sister   . Hypertension Sister   . Cancer Father     History  Substance Use Topics  . Smoking status: Never Smoker   . Smokeless tobacco: Never Used  . Alcohol Use: No    OB History    Grav Para Term Preterm Abortions TAB SAB Ect Mult Living                  Review of Systems  Constitutional: Negative for activity change.  HENT: Negative for facial swelling and neck pain.     Respiratory: Negative for cough, shortness of breath and wheezing.   Cardiovascular: Negative for chest pain.  Gastrointestinal: Positive for nausea, vomiting and abdominal pain. Negative for diarrhea, constipation, blood in stool and abdominal distention.  Genitourinary: Negative for dysuria, hematuria and difficulty urinating.  Skin: Negative for color change.  Neurological: Negative for speech difficulty and headaches.  Hematological: Does not bruise/bleed easily.  Psychiatric/Behavioral: Negative for confusion.    Allergies  Morphine and related  Home Medications   Current Outpatient Rx  Name Route Sig Dispense Refill  . ALPRAZOLAM 0.25 MG PO TABS Oral Take 1 tablet (0.25 mg total) by mouth 3 (three) times daily. 30 tablet 0  . VITAMIN D 1000 UNITS PO TABS Oral Take 1,000 Units by mouth daily.    Marland Kitchen CITALOPRAM HYDROBROMIDE 20 MG PO TABS Oral Take 20 mg by mouth daily.    Marland Kitchen DILTIAZEM HCL ER BEADS 120 MG PO CP24 Oral Take 1 capsule (120 mg total) by mouth daily. 30 capsule 0  . INSULIN ASPART 100 UNIT/ML Advance SOLN Subcutaneous Inject 6-8 Units into the skin 3 (three) times daily before meals. Inject 6 units in AM, 8 units at lunch, and 6 units at dinner    . INSULIN GLARGINE 100 UNIT/ML  SOLN  Subcutaneous Inject 24 Units into the skin at bedtime. 10 mL   . LYRICA 75 MG PO CAPS Oral Take 75 mg by mouth 2 (two) times daily.    Marland Kitchen MECLIZINE HCL 12.5 MG PO TABS Oral Take 12.5 mg by mouth 3 (three) times daily as needed. For dizziness    . ADULT MULTIVITAMIN W/MINERALS CH Oral Take 1 tablet by mouth daily.    Marland Kitchen TEMAZEPAM 7.5 MG PO CAPS Oral Take 1 capsule (7.5 mg total) by mouth at bedtime as needed for sleep. 30 capsule 0  . VITAMIN C 500 MG PO TABS Oral Take 500 mg by mouth daily.    Marland Kitchen ZINC SULFATE 220 MG PO CAPS Oral Take 220 mg by mouth daily.    Marland Kitchen DICLOFENAC SODIUM 1 % TD GEL Topical Apply 1 application topically 4 (four) times daily as needed. As needed for pain 100 g 0    BP  164/87  Pulse 118  Temp 99.3 F (37.4 C) (Oral)  Resp 20  SpO2 96%  Physical Exam  Nursing note and vitals reviewed. Constitutional: She is oriented to person, place, and time. She appears well-developed.  HENT:  Head: Normocephalic and atraumatic.  Eyes: Conjunctivae normal and EOM are normal. Pupils are equal, round, and reactive to light.  Neck: Normal range of motion. Neck supple.  Cardiovascular: Normal rate, regular rhythm and normal heart sounds.   Pulmonary/Chest: Effort normal and breath sounds normal. No respiratory distress.  Abdominal: Soft. Bowel sounds are normal. She exhibits no distension. There is tenderness. There is guarding. There is no rebound.       Bilateral lower quadrant pains, right worse than left with guarding, no rebound  Neurological: She is alert and oriented to person, place, and time.  Skin: Skin is warm and dry.    ED Course  Procedures (including critical care time)  Labs Reviewed  BASIC METABOLIC PANEL - Abnormal; Notable for the following:    Glucose, Bld 179 (*)     BUN 31 (*)     Creatinine, Ser 1.69 (*)     GFR calc non Af Amer 30 (*)     GFR calc Af Amer 34 (*)     All other components within normal limits  CBC WITH DIFFERENTIAL - Abnormal; Notable for the following:    Platelets 142 (*)     Neutrophils Relative 79 (*)     Neutro Abs 8.0 (*)     All other components within normal limits  URINALYSIS, ROUTINE W REFLEX MICROSCOPIC - Abnormal; Notable for the following:    APPearance TURBID (*)     Glucose, UA 100 (*)     Hgb urine dipstick LARGE (*)     Protein, ur 30 (*)     Nitrite POSITIVE (*)     Leukocytes, UA LARGE (*)     All other components within normal limits  HEPATIC FUNCTION PANEL - Abnormal; Notable for the following:    Albumin 3.2 (*)     Alkaline Phosphatase 118 (*)     All other components within normal limits  URINE MICROSCOPIC-ADD ON - Abnormal; Notable for the following:    Squamous Epithelial / LPF MANY (*)      Bacteria, UA MANY (*)     All other components within normal limits  TROPONIN I  LACTIC ACID, PLASMA  URINE CULTURE  PROTIME-INR  APTT   Dg Abd Acute W/chest  02/27/2012  *RADIOLOGY REPORT*  Clinical Data: Abdominal pain.  ACUTE  ABDOMEN SERIES (ABDOMEN 2 VIEW & CHEST 1 VIEW)  Comparison: 02/01/2012  Findings: Moderate stool burden throughout the colon. There is normal bowel gas pattern.  No free air.  No organomegaly or suspicious calcification.  No acute bony abnormality.  Mild cardiomegaly and vascular congestion.  Low lung volumes.  No confluent opacities or effusions.  No acute bony abnormality.  IMPRESSION: Moderate stool burden.  No bowel obstruction or free air.  Cardiomegaly, vascular congestion.   Original Report Authenticated By: Cyndie Chime, M.D.      No diagnosis found.    MDM  DDx includes:  SBO ACS syndrome Aortic Dissection Colitis AAA Tumors Colitis Intra abdominal abscess Thrombosis Mesenteric ischemia Diverticulitis Peritonitis Appendicitis Hernia Nephrolithiasis Pyelonephritis UTI/Cystitis Ovarian cyst TOA Ectopic pregnancy PID STD  Pt comes in with cc of abd pain. Sudden onset. She has hx of diabetes, afib, CAD, CKD - all putting her at high risk for mesenteric ischemia. Will need a CT abd. Will hydrate.  11:34 AM Urine specimen has too may squams, will get repeat UA with a foley     Derwood Kaplan, MD 02/27/12 1134  Pt has a UTI, with a renal stone. 4mm stone. Dr. Annabell Howells informed. Pain and nausea are persistent. Pt has SIRs criteria with negative lactate - septic calculi!  Ceftriaxone given, Dr. Annabell Howells recommends Unasyn.  Derwood Kaplan, MD 02/27/12 1407  Derwood Kaplan, MD 02/27/12 1610

## 2012-02-27 NOTE — Anesthesia Postprocedure Evaluation (Signed)
Anesthesia Post Note  Patient: Tina [name] Area  Procedure(s) Performed: Procedure(s) (LRB): CYSTOSCOPY WITH RETROGRADE PYELOGRAM/URETERAL STENT PLACEMENT (Left)  Anesthesia type: MAC  Patient location: PACU  Post pain: Pain level controlled  Post assessment: Post-op Vital signs reviewed  Last Vitals:  Filed Vitals:   02/27/12 1945  BP: 108/49  Pulse: 98  Temp:   Resp: 15    Post vital signs: Reviewed  Level of consciousness: sedated  Complications: No apparent anesthesia complications

## 2012-02-27 NOTE — ED Notes (Signed)
OR tech came to pick patient up. Son took all belongings home.

## 2012-02-27 NOTE — ED Notes (Signed)
[id number]<AV> Expected date:02/27/12<BR> Expected time: 3:38 PM<BR> Means of arrival:Hospital Transport<BR> Comments:<BR> Hold for pt from [facility]

## 2012-02-27 NOTE — ED Notes (Addendum)
C/o nausea, medicated per order. Vomited contrast

## 2012-02-27 NOTE — ED Notes (Signed)
Patient transported to X-ray 

## 2012-02-27 NOTE — H&P (Signed)
Urology Admission H&P  Chief Complaint: Left flank pain  History of Present Illness: Tina [name] was seen in the ER at [facility] today for severe left flank pain that began yesterday.  She also was noted to have a fever to 101.5 and an infected urine.   A CT was done that showed a 4mm left UPJ stone with obstruction and there was a 6mm renal stone.   She had a CT in September that showed the stone in the same spot without as much hydro.     Past Medical History  Diagnosis Date  . Normal exercise sestamibi stress test 2007    [facility]  . Diabetes mellitus   . Hyperlipidemia   . Hypertension   . Heart murmur   . Chronic kidney disease     CKD stage 3  . Depression   . Stroke 06/2011    [facility]   . Atrial fibrillation    Past Surgical History  Procedure Date  . Bladder surgery 1994  . Knee surgery as child  . Abdominal hysterectomy     Home Medications:  Current facility-administered medications:0.9 %  sodium chloride infusion, , Intravenous, Once, Derwood Kaplan, MD, Last Rate: 100 mL/hr at 02/27/12 1156;  acetaminophen (TYLENOL) suppository 650 mg, 650 mg, Rectal, Once, Derwood Kaplan, MD, 650 mg at 02/27/12 1504;  Ampicillin-Sulbactam (UNASYN) 3 g in sodium chloride 0.9 % 100 mL IVPB, 3 g, Intravenous, Once, Ankit Nanavati, MD, 3 g at 02/27/12 1525 cefTRIAXone (ROCEPHIN) 1 g in dextrose 5 % 50 mL IVPB, 1 g, Intravenous, Once, Ankit Nanavati, MD, 1 g at 02/27/12 1421;  HYDROmorphone (DILAUDID) injection 1 mg, 1 mg, Intravenous, Once, Ankit Nanavati, MD, 1 mg at 02/27/12 1018;  HYDROmorphone (DILAUDID) injection 1 mg, 1 mg, Intravenous, Once, Ankit Nanavati, MD, 1 mg at 02/27/12 1631 iohexol (OMNIPAQUE) 300 MG/ML solution 20 mL, 20 mL, Oral, Q1 Hr x 2, Medication Radiologist, MD, 20 mL at 02/27/12 1200;  iohexol (OMNIPAQUE) 300 MG/ML solution 75 mL, 75 mL, Intravenous, Once PRN, Medication Radiologist, MD, 75 mL at 02/27/12 1341;  ondansetron (ZOFRAN) injection 4 mg, 4 mg, Intravenous, Once, Ankit  Nanavati, MD, 4 mg at 02/27/12 1018 ondansetron (ZOFRAN) injection 4 mg, 4 mg, Intravenous, Once, Ankit Nanavati, MD, 4 mg at 02/27/12 1231;  ondansetron (ZOFRAN) injection 4 mg, 4 mg, Intravenous, Once, Ankit Nanavati, MD, 4 mg at 02/27/12 1631;  sodium chloride 0.9 % bolus 1,000 mL, 1,000 mL, Intravenous, Once, Ankit Nanavati, MD, 1,000 mL at 02/27/12 1012;  DISCONTD: acetaminophen (TYLENOL) suppository 325 mg, 325 mg, Rectal, Once, Derwood Kaplan, MD Current outpatient prescriptions:ALPRAZolam (XANAX) 0.25 MG tablet, Take 1 tablet (0.25 mg total) by mouth 3 (three) times daily., Disp: 30 tablet, Rfl: 0;  cholecalciferol (VITAMIN D) 1000 UNITS tablet, Take 1,000 Units by mouth daily., Disp: , Rfl: ;  citalopram (CELEXA) 20 MG tablet, Take 20 mg by mouth daily., Disp: , Rfl: ;  diltiazem (TIAZAC) 120 MG 24 hr capsule, Take 1 capsule (120 mg total) by mouth daily., Disp: 30 capsule, Rfl: 0 insulin aspart (NOVOLOG) 100 UNIT/ML injection, Inject 6-8 Units into the skin 3 (three) times daily before meals. Inject 6 units in AM, 8 units at lunch, and 6 units at dinner, Disp: , Rfl: ;  insulin glargine (LANTUS) 100 UNIT/ML injection, Inject 24 Units into the skin at bedtime., Disp: 10 mL, Rfl: ;  LYRICA 75 MG capsule, Take 75 mg by mouth 2 (two) times daily., Disp: , Rfl:  meclizine (ANTIVERT) 12.5 MG  tablet, Take 12.5 mg by mouth 3 (three) times daily as needed. For dizziness, Disp: , Rfl: ;  Multiple Vitamin (MULTIVITAMIN WITH MINERALS) TABS, Take 1 tablet by mouth daily., Disp: , Rfl: ;  temazepam (RESTORIL) 7.5 MG capsule, Take 1 capsule (7.5 mg total) by mouth at bedtime as needed for sleep., Disp: 30 capsule, Rfl: 0;  vitamin C (ASCORBIC ACID) 500 MG tablet, Take 500 mg by mouth daily., Disp: , Rfl:  zinc sulfate 220 MG capsule, Take 220 mg by mouth daily., Disp: , Rfl: ;  diclofenac sodium (VOLTAREN) 1 % GEL, Apply 1 application topically 4 (four) times daily as needed. As needed for pain, Disp: 100 g, Rfl:  0 She is on low dose ASA and has been on coumadin but her INR is normal.   (Not in a hospital admission) Allergies:  Allergies  Allergen Reactions  . Morphine And Related     Family History  Problem Relation Age of Onset  . Coronary artery disease Mother   . Diabetes Mother   . Heart disease Mother   . Diabetes Sister   . Hypertension Sister   . Cancer Father    Social History:  reports that she has never smoked. She has never used smokeless tobacco. She reports that she does not drink alcohol or use illicit drugs.  Review of Systems  Constitutional: Positive for fever and chills.  Respiratory: Positive for shortness of breath. Negative for cough.   Cardiovascular: Negative for chest pain.  Gastrointestinal: Positive for nausea, vomiting and abdominal pain (left flank pain). Negative for constipation.  Genitourinary: Positive for dysuria and flank pain. Negative for hematuria.  Musculoskeletal: Positive for joint pain.  Neurological: Positive for focal weakness (from her recent stroke.). Negative for headaches.  Endo/Heme/Allergies: Bruises/bleeds easily.  Psychiatric/Behavioral: Positive for memory loss.  All other systems reviewed and are negative.    Physical Exam:  Vital signs in last 24 hours: Temp:  [98.3 F (36.8 C)-101.9 F (38.8 C)] 98.3 F (36.8 C) (10/18 1647) Pulse Rate:  [110-121] 115  (10/18 1647) Resp:  [20-23] 23  (10/18 1647) BP: (132-171)/(64-92) 132/87 mmHg (10/18 1647) SpO2:  [94 %-98 %] 96 % (10/18 1647) Physical Exam  Constitutional: She is oriented to person, place, and time. She appears well-developed and well-nourished.  HENT:  Head: Normocephalic and atraumatic.  Mouth/Throat: Oropharyngeal exudate: tachycardia.  Eyes: Pupils are equal, round, and reactive to light.  Neck: Normal range of motion. Neck supple. No thyromegaly present.  Cardiovascular: Normal heart sounds.        tachycardia  Respiratory: Effort normal and breath sounds  normal. She has no wheezes.  GI: Soft. There is tenderness (diffuse but greatest in the LUQ. ).  Musculoskeletal: She exhibits no edema.  Neurological: She is alert and oriented to person, place, and time.       But lethargic.   Skin: Skin is warm. She is diaphoretic.  Psychiatric: She has a normal mood and affect.    Laboratory Data:  Results for orders placed during the hospital encounter of 02/27/12 (from the past 24 hour(s))  BASIC METABOLIC PANEL     Status: Abnormal   Collection Time   02/27/12 10:02 AM      Component Value Range   Sodium 139  135 - 145 mEq/L   Potassium 4.7  3.5 - 5.1 mEq/L   Chloride 104  96 - 112 mEq/L   CO2 19  19 - 32 mEq/L   Glucose, Bld 179 (*) 70 -  99 mg/dL   BUN 31 (*) 6 - 23 mg/dL   Creatinine, Ser 9.62 (*) 0.50 - 1.10 mg/dL   Calcium 9.0  8.4 - 95.2 mg/dL   GFR calc non Af Amer 30 (*) >90 mL/min   GFR calc Af Amer 34 (*) >90 mL/min  CBC WITH DIFFERENTIAL     Status: Abnormal   Collection Time   02/27/12 10:02 AM      Component Value Range   WBC 10.1  4.0 - 10.5 K/uL   RBC 4.38  3.87 - 5.11 MIL/uL   Hemoglobin 12.3  12.0 - 15.0 g/dL   HCT 84.1  32.4 - 40.1 %   MCV 84.5  78.0 - 100.0 fL   MCH 28.1  26.0 - 34.0 pg   MCHC 33.2  30.0 - 36.0 g/dL   RDW 02.7  25.3 - 66.4 %   Platelets 142 (*) 150 - 400 K/uL   Neutrophils Relative 79 (*) 43 - 77 %   Neutro Abs 8.0 (*) 1.7 - 7.7 K/uL   Lymphocytes Relative 15  12 - 46 %   Lymphs Abs 1.5  0.7 - 4.0 K/uL   Monocytes Relative 5  3 - 12 %   Monocytes Absolute 0.5  0.1 - 1.0 K/uL   Eosinophils Relative 1  0 - 5 %   Eosinophils Absolute 0.1  0.0 - 0.7 K/uL   Basophils Relative 0  0 - 1 %   Basophils Absolute 0.0  0.0 - 0.1 K/uL  TROPONIN I     Status: Normal   Collection Time   02/27/12 10:02 AM      Component Value Range   Troponin I <0.30  <0.30 ng/mL  HEPATIC FUNCTION PANEL     Status: Abnormal   Collection Time   02/27/12 10:02 AM      Component Value Range   Total Protein 7.4  6.0 - 8.3  g/dL   Albumin 3.2 (*) 3.5 - 5.2 g/dL   AST 15  0 - 37 U/L   ALT 9  0 - 35 U/L   Alkaline Phosphatase 118 (*) 39 - 117 U/L   Total Bilirubin 0.5  0.3 - 1.2 mg/dL   Bilirubin, Direct 0.1  0.0 - 0.3 mg/dL   Indirect Bilirubin 0.4  0.3 - 0.9 mg/dL  URINALYSIS, ROUTINE W REFLEX MICROSCOPIC     Status: Abnormal   Collection Time   02/27/12 10:36 AM      Component Value Range   Color, Urine YELLOW  YELLOW   APPearance TURBID (*) CLEAR   Specific Gravity, Urine 1.016  1.005 - 1.030   pH 5.0  5.0 - 8.0   Glucose, UA 100 (*) NEGATIVE mg/dL   Hgb urine dipstick LARGE (*) NEGATIVE   Bilirubin Urine NEGATIVE  NEGATIVE   Ketones, ur NEGATIVE  NEGATIVE mg/dL   Protein, ur 30 (*) NEGATIVE mg/dL   Urobilinogen, UA 0.2  0.0 - 1.0 mg/dL   Nitrite POSITIVE (*) NEGATIVE   Leukocytes, UA LARGE (*) NEGATIVE  URINE MICROSCOPIC-ADD ON     Status: Abnormal   Collection Time   02/27/12 10:36 AM      Component Value Range   Squamous Epithelial / LPF MANY (*) RARE   WBC, UA TOO NUMEROUS TO COUNT  <3 WBC/hpf   RBC / HPF 7-10  <3 RBC/hpf   Bacteria, UA MANY (*) RARE   Urine-Other MANY YEAST    LACTIC ACID, PLASMA     Status: Normal  Collection Time   02/27/12 10:58 AM      Component Value Range   Lactic Acid, Venous 1.8  0.5 - 2.2 mmol/L  PROTIME-INR     Status: Normal   Collection Time   02/27/12 11:01 AM      Component Value Range   Prothrombin Time 13.5  11.6 - 15.2 seconds   INR 1.04  0.00 - 1.49  APTT     Status: Normal   Collection Time   02/27/12 11:01 AM      Component Value Range   aPTT 27  24 - 37 seconds  URINALYSIS, ROUTINE W REFLEX MICROSCOPIC     Status: Abnormal   Collection Time   02/27/12 11:43 AM      Component Value Range   Color, Urine YELLOW  YELLOW   APPearance TURBID (*) CLEAR   Specific Gravity, Urine 1.014  1.005 - 1.030   pH 5.0  5.0 - 8.0   Glucose, UA 100 (*) NEGATIVE mg/dL   Hgb urine dipstick MODERATE (*) NEGATIVE   Bilirubin Urine NEGATIVE  NEGATIVE    Ketones, ur NEGATIVE  NEGATIVE mg/dL   Protein, ur NEGATIVE  NEGATIVE mg/dL   Urobilinogen, UA 0.2  0.0 - 1.0 mg/dL   Nitrite POSITIVE (*) NEGATIVE   Leukocytes, UA LARGE (*) NEGATIVE  URINE MICROSCOPIC-ADD ON     Status: Abnormal   Collection Time   02/27/12 11:43 AM      Component Value Range   WBC, UA TOO NUMEROUS TO COUNT  <3 WBC/hpf   RBC / HPF 3-6  <3 RBC/hpf   Bacteria, UA MANY (*) RARE   Urine-Other FEW YEAST      I have reviewed her CT films and results.     No results found for this or any previous visit (from the past 240 hour(s)). Creatinine:  Basename 02/27/12 1002  CREATININE 1.69*    Impression/Assessment:  She has a left UPJ stone with pyelonephritis. Renal insufficiency.  Plan:  She was given Unasyn and Rocephin in the ER. I am going to take her to the OR for cystoscopy and left stent insertion.   I have reviewed the risks of bleeding, infection, ureteral injury, need for a perc, thrombotic events and anesthetic complications.   Danylah Holden J 02/27/2012, 5:14 PM

## 2012-02-27 NOTE — ED Notes (Signed)
OR called and sts this patient is going to surgery

## 2012-02-27 NOTE — Anesthesia Preprocedure Evaluation (Signed)
Anesthesia Evaluation  Patient identified by MRN, date of birth, ID band Patient awake    Reviewed: Allergy & Precautions, H&P , NPO status , Patient's Chart, lab work & pertinent test results  Airway Mallampati: II TM Distance: >3 FB Neck ROM: Full    Dental  (+) Edentulous Upper and Edentulous Lower   Pulmonary shortness of breath and with exertion,  breath sounds clear to auscultation  Pulmonary exam normal       Cardiovascular hypertension, Pt. on medications + dysrhythmias Atrial Fibrillation + Valvular Problems/Murmurs Rhythm:Irregular Rate:Tachycardia     Neuro/Psych Depression TIACVA negative psych ROS   GI/Hepatic Neg liver ROS, GERD-  ,  Endo/Other  diabetes, Poorly Controlled, Type 2, Insulin DependentMorbid obesity  Renal/GU Renal disease  negative genitourinary   Musculoskeletal negative musculoskeletal ROS (+)   Abdominal   Peds negative pediatric ROS (+)  Hematology negative hematology ROS (+)   Anesthesia Other Findings   Reproductive/Obstetrics negative OB ROS                           Anesthesia Physical Anesthesia Plan  ASA: III and Emergent  Anesthesia Plan: MAC   Post-op Pain Management:    Induction: Intravenous  Airway Management Planned: Simple Face Mask  Additional Equipment:   Intra-op Plan:   Post-operative Plan:   Informed Consent: I have reviewed the patients History and Physical, chart, labs and discussed the procedure including the risks, benefits and alternatives for the proposed anesthesia with the patient or authorized representative who has indicated his/her understanding and acceptance.   Dental advisory given  Plan Discussed with: CRNA  Anesthesia Plan Comments:         Anesthesia Quick Evaluation

## 2012-02-27 NOTE — Transfer of Care (Signed)
Immediate Anesthesia Transfer of Care Note  Patient: Tina [name]  Procedure(s) Performed: Procedure(s) (LRB) with comments: CYSTOSCOPY WITH RETROGRADE PYELOGRAM/URETERAL STENT PLACEMENT (Left) - cystoscopy, left rerograde pyelogram, insertion of left ureteral dual stents, and removal of bladder stone  Patient Location: PACU  Anesthesia Type: MAC  Level of Consciousness: awake, sedated and patient cooperative  Airway & Oxygen Therapy: Patient Spontanous Breathing and Patient connected to face mask oxygen  Post-op Assessment: Report given to PACU RN and Post -op Vital signs reviewed and stable  Post vital signs: Reviewed and stable  Complications: No apparent anesthesia complications

## 2012-02-27 NOTE — ED Notes (Signed)
Finished drinking contrast, CT notified 

## 2012-02-28 DIAGNOSIS — A419 Sepsis, unspecified organism: Secondary | ICD-10-CM

## 2012-02-28 DIAGNOSIS — N179 Acute kidney failure, unspecified: Secondary | ICD-10-CM

## 2012-02-28 DIAGNOSIS — R4182 Altered mental status, unspecified: Secondary | ICD-10-CM

## 2012-02-28 LAB — BASIC METABOLIC PANEL
Chloride: 103 mEq/L (ref 96–112)
Creatinine, Ser: 1.89 mg/dL — ABNORMAL HIGH (ref 0.50–1.10)
GFR calc Af Amer: 30 mL/min — ABNORMAL LOW (ref >90)
GFR calc non Af Amer: 26 mL/min — ABNORMAL LOW (ref >90)
Potassium: 4.9 mEq/L (ref 3.5–5.1)

## 2012-02-28 LAB — GLUCOSE, CAPILLARY
Glucose-Capillary: 167 mg/dL — ABNORMAL HIGH (ref 70–99)
Glucose-Capillary: 142 mg/dL — ABNORMAL HIGH (ref 70–99)

## 2012-02-28 LAB — CBC
HCT: 29.4 % — ABNORMAL LOW (ref 36.0–46.0)
Hemoglobin: 9.6 g/dL — ABNORMAL LOW (ref 12.0–15.0)
RDW: 15.4 % (ref 11.5–15.5)
WBC: 7.4 10*3/uL (ref 4.0–10.5)

## 2012-02-28 NOTE — Op Note (Signed)
NAMEGREENLEIGH, KAUTH NO.:  [id number]  MEDICAL RECORD NO.:  [id number]  LOCATION:  1415                         FACILITY:  [facility]  PHYSICIAN:  Excell Seltzer. Annabell Howells, M.D.    DATE OF BIRTH:  1941/01/22  DATE OF PROCEDURE:  02/27/2012 DATE OF DISCHARGE:                              OPERATIVE REPORT   PROCEDURE: 1. Cystoscopy, removal of bladder stone. 2. Left retrograde pyelogram. 3. Placement of left upper pole and lower pole double-J stent.  PREOPERATIVE DIAGNOSIS:  Left obstructing ureteropelvic stone with pyelonephritis.  POSTOPERATIVE DIAGNOSIS:  Left obstructing ureteropelvic stone with pyelonephritis with duplicated left collecting system, and a small bladder stone.  SURGEON:  Excell Seltzer. Annabell Howells, MD  ANESTHESIA:  General.  SPECIMEN:  Urine culture from the left renal pelvis and a bladder stone.  DRAINS:  A 4.8-French x 24 cm left upper pole double-J stent, and 6- French x 24 cm left lower pole  double-J stent, and 18-French Foley catheter.  BLOOD LOSS:  Minimal.  COMPLICATIONS:  None.  INDICATIONS:  Ms. Conlee is a 71 year old white female, who was seen in the emergency room at [facility] today with left flank pain and fever.  She was found on CT to have an obstructing 4  mm left UPJ stone and a 6-mm lower pole stone.  Because of the fever it was felt that stenting was indicated.  She had been given Unasyn, Rocephin at [facility].  FINDINGS OF PROCEDURE:  She was taken to the operating room where she was placed on the table in the lithotomy position after induction of MAC anesthesia.  Her Foley catheter was removed.  Her perineum and genitalia were prepped with Betadine solution.  She was draped in usual sterile fashion.  Cystoscopy was performed using a 22-French scope and 12-degree lens. Examination revealed a normal urethra.  The bladder wall was markedly inflamed with diffuse follicular cystitis changes.  No tumors were noted but there was a  4-5 mm bladder stone, it was aspirated from the bladder. The right ureteral orifice was unremarkable as was the left ureteral orifice.  The left ureteral orifice was cannulated with a 5-French open-end catheter and contrast was gently instilled.  This demonstrated a delicate ureter to the UPJ where she was noted to have a bifurcated collecting system, and it appeared that the lower pole was receiving the contrast less briskly and was felt to be the obstructed moiety.  At this point, a sensor guidewire was passed through the open-end catheter.  It passed into the upper pole infundibulum and with passage there was brisk efflux of purulent appearing urine.  However, at this point, it was felt that axis of the lower pole was indicated and the Sensor wire was backed out, and an angle tipped Glidewire was negotiated into the lower pole collecting system.  The open-end catheter was advanced over the Glidewire into the lower pole collecting system.  The Glidewire was removed and urine was aspirated, it was quite turbid and bloody, this was sent for culture.  At this point, the open-end catheter was removed and a 6-French 24-cm double-J stent was inserted over the wire into the lower pole.  The wire was removed leaving good coil  in the kidney, and good coil in the bladder.  At this point, I was still concerned about the upper pole so the Sensor guidewire was then inserted alongside the 6-French double-J stent and then passed into the upper pole collecting system.  Once again, there was brisk efflux of purulent urine once the wire was in the upper pole. So at this point, I felt it was prudent to put in a second stent so a 4.8-French 24-cm stent was then passed over the guidewire into the upper pole with great care being taken to ensure that the previously placed stent did not get pushed too far retrograde but it stayed in good position.  Once the 4.8-French stent was in position, the wire  was removed, and a good coil formed in the kidney and in the bladder.  Purulent urine and efflux between the 2 stents.  At this point, the bladder was drained.  The cystoscope was removed.  An 18-French Foley catheter was inserted.  The balloon was filled with 10 mL sterile fluid.  The patient was taken down from lithotomy position and her sedation was reduced and she was moved to recovery room in stable condition.  There were no complications.     Excell Seltzer. Annabell Howells, M.D.     JJW/MEDQ  D:  02/27/2012  T:  02/28/2012  Job:  [id number]

## 2012-02-28 NOTE — Progress Notes (Signed)
Patient ID: Tina [name], female   DOB: 1940/12/24, 71 y.o.   MRN: [id number]  [facility] PROGRESS NOTE  Tina [name] ION:[id number] DOB: Aug 21, 1940 DOA: 02/27/2012 PCP: Carney Living, MD  Brief narrative: Pt admitted 10/18 under Dr. Annabell Howells care for further management of left obstructing ureteropelvic stone  Active Problems: 1. Hydronephrosis/ Left obstructing ureteropelvic stone with pyelonephritis - s/p cystoscopy with retrograde pyelogram and ureteral stent placement. Further management as per urology. Continue current antibiotic Unasyn day #2, IVF, pain control 2. Hypertension: Improved but slightly above target, continue current medication regimen 3. Acute on chronic renal failure - review of records indicate baseline Cr 1.6 - 1.7, continue IVF for now, BMP in AM 4. Anemia of chronic disease - review of record indicate baseline Hg 9-10 and pt currently with Hg at baseline, CBC in AM 5. Diabetes mellitus - continue SSI for now  Procedures/Studies:  10/19/21013 - Dr. Annabell Howells CYSTOSCOPY WITH RETROGRADE PYELOGRAM/URETERAL STENT PLACEMENT (Left)  Ct Abdomen Pelvis W Contrast 02/27/2012   MPRESSION:   1.  Moderate left hydronephrosis and perinephric stranding due to 4 mm calculus at the left ureteral pelvic junction.  2.  6 mm nonobstructing calculus lower pole left kidney.  3.  Cholelithiasis, without evidence of cholecystitis.     Dg Abd Acute W/chest 02/27/2012   IMPRESSION:  Moderate stool burden.  No bowel obstruction or free air.  Cardiomegaly, vascular congestion.    Antibiotics:  Unasyn 10/18 -->  Ciprofloxacin 10/18 -->  Code Status: Full Family Communication: Pt at bedside Disposition Plan: Home when medically stable  HPI/Subjective: No events overnight.   Objective: Filed Vitals:   02/27/12 2119 02/28/12 0541 02/28/12 1401 02/28/12 1716  BP: 145/53 137/52 132/45   Pulse: 76 94 94   Temp: 98.9 F (37.2 C) 99.4 F (37.4 C) 99.3 F  (37.4 C)   TempSrc:  Oral Oral   Resp: 18 18 19    Height:    5\' 5"  (1.651 m)  Weight:    116.8 kg (257 lb 8 oz)  SpO2: 95% 93% 93%     Intake/Output Summary (Last 24 hours) at 02/28/12 2033 Last data filed at 02/28/12 1900  Gross per 24 hour  Intake   2385 ml  Output    901 ml  Net   1484 ml    Exam:   General:  Pt is alert, follows commands appropriately, not in acute distress  Cardiovascular: Regular rate and rhythm, S1/S2, no murmurs, no rubs, no gallops  Respiratory: Clear to auscultation bilaterally, no wheezing, no crackles, no rhonchi  Abdomen: Soft, tender in lower bilateral quadrants, non distended, bowel sounds present, no guarding  Extremities: No edema, pulses DP and PT palpable bilaterally  Neuro: Grossly nonfocal  Data Reviewed: Basic Metabolic Panel:  Lab 02/28/12 2440 02/27/12 1002  NA 135 139  K 4.9 4.7  CL 103 104  CO2 23 19  GLUCOSE 203* 179*  BUN 28* 31*  CREATININE 1.89* 1.69*  CALCIUM 7.8* 9.0  MG -- --  PHOS -- --   Liver Function Tests:  Lab 02/27/12 1002  AST 15  ALT 9  ALKPHOS 118*  BILITOT 0.5  PROT 7.4  ALBUMIN 3.2*   CBC:  Lab 02/28/12 0550 02/27/12 1002  WBC 7.4 10.1  NEUTROABS -- 8.0*  HGB 9.6* 12.3  HCT 29.4* 37.0  MCV 84.0 84.5  PLT 117* 142*   Cardiac Enzymes:  Lab 02/27/12 1002  CKTOTAL --  CKMB --  CKMBINDEX --  TROPONINI <0.30  CBG:  Lab 02/28/12 1659 02/28/12 1152 02/28/12 0811 02/27/12 2122 02/27/12 1928  GLUCAP 194* 164* 167* 177* 180*    Recent Results (from the past 240 hour(s))  URINE CULTURE     Status: Normal (Preliminary result)   Collection Time   02/27/12 10:36 AM      Component Value Range Status Comment   Specimen Description URINE, CLEAN CATCH   Final    Special Requests NONE   Final    Culture  Setup Time 02/27/2012 11:46   Final    Colony Count >=100,000 COLONIES/ML   Final    Culture GRAM NEGATIVE RODS   Final    Report Status PENDING   Incomplete   URINE CULTURE      Status: Normal (Preliminary result)   Collection Time   02/27/12 11:43 AM      Component Value Range Status Comment   Specimen Description URINE, CATHETERIZED   Final    Special Requests ADDED 02/27/12 1415   Final    Culture  Setup Time 02/27/2012 14:44   Final    Colony Count >=100,000 COLONIES/ML   Final    Culture GRAM NEGATIVE RODS   Final    Report Status PENDING   Incomplete   CULTURE, BLOOD (ROUTINE X 2)     Status: Normal (Preliminary result)   Collection Time   02/27/12  3:15 PM      Component Value Range Status Comment   Specimen Description BLOOD FOREARM RIGHT   Final    Special Requests BOTTLES DRAWN AEROBIC ONLY 3CC PT ON ROCEPHIN   Final    Culture  Setup Time 02/27/2012 21:42   Final    Culture     Final    Value:        BLOOD CULTURE RECEIVED NO GROWTH TO DATE CULTURE WILL BE HELD FOR 5 DAYS BEFORE ISSUING A FINAL NEGATIVE REPORT   Report Status PENDING   Incomplete   CULTURE, BLOOD (ROUTINE X 2)     Status: Normal (Preliminary result)   Collection Time   02/27/12  3:23 PM      Component Value Range Status Comment   Specimen Description BLOOD HAND RIGHT   Final    Special Requests BOTTLES DRAWN AEROBIC ONLY 10CC PT ON ROCEPHIN   Final    Culture  Setup Time 02/27/2012 21:42   Final    Culture     Final    Value:        BLOOD CULTURE RECEIVED NO GROWTH TO DATE CULTURE WILL BE HELD FOR 5 DAYS BEFORE ISSUING A FINAL NEGATIVE REPORT   Report Status PENDING   Incomplete      Scheduled Meds:   . ALPRAZolam  0.25 mg Oral Daily  . UNASYN IV  1.5 g Intravenous Q8H  . cholecalciferol  1,000 Units Oral Daily  . ciprofloxacin  200 mg Intravenous Q12H  . citalopram  20 mg Oral Daily  . diltiazem  120 mg Oral Daily  . docusate sodium  100 mg Oral BID  . insulin aspart  0-15 Units Subcutaneous TID WC  . insulin glargine  24 Units Subcutaneous QHS  . multivitamin  1 tablet Oral Daily  . pregabalin  75 mg Oral BID   Continuous Infusions:   . 0.45 % NaCl with KCl 20 mEq  / L 100 mL/hr at 02/28/12 1015  . DISCONTD: lactated ringers       Debbora Presto, MD  [facility] Pager [phone]  If 7PM-7AM, please  contact night-coverage [email or web address] Password [email or web address] 02/28/2012, 8:33 PM   LOS: 1 day

## 2012-02-28 NOTE — Progress Notes (Signed)
Xarelto PTA  Pharmacy noted that patient was on Xarelto PTA for h/o atrial fibrillation.  Per PCP's note on 9/21, Xarelto was placed on hold given renal failure and consider heparin bridge.  Per clinical trials, CrCl were calculated using actual body weight. For this patient, current CrCl is 53 ml/min, which require no dosage adjustment and patient can continue Xarelto 20 mg po daily with meal.  If concern that patient's renal fxn will worsen, can empirically reduce Xarelto to 15mg /day with meal OR consider alternative anticoagulation therapy during this time to avoid complications.  Thanks  Geoffry Paradise, PharmD, BCPS Pager: [phone] 10:30 AM Pharmacy #: [phone]

## 2012-02-28 NOTE — Progress Notes (Signed)
Patient ID: Tina Tina [name], female   DOB: [birth year], 71 y.o.   MRN: [id number] 1 Day Post-Op  Subjective: Tina Tina [name] is better this morning following placement of 2 left ureteral stents for her left UPJ stone with pyonephrosis in a duplicated system.  She is having increased SP pain.  Her fever has resolved, but her Cr has increased and she has an anemia that is probably chronic and was unmasked by hydration since she has no evidence of bleeding,  ROS: Negative except nausea and chronic SOB.   She also reports and history of urinary incontinence.   Objective: Vital signs in last 24 hours: Temp:  [98.2 F (36.8 C)-101.9 F (38.8 C)] 99.4 F (37.4 C) (10/19 0541) Pulse Rate:  [76-121] 94  (10/19 0541) Resp:  [15-32] 18  (10/19 0541) BP: (108-171)/(49-94) 137/52 mmHg (10/19 0541) SpO2:  [93 %-100 %] 93 % (10/19 0541)  Intake/Output from previous day: 10/18 0701 - 10/19 0700 In: 2900 [P.O.:480; I.V.:2220; IV Piggyback:200] Out: 500 [Urine:400; Emesis/NG output:100] Intake/Output this shift:    General appearance: alert and no distress Resp: clear to auscultation bilaterally Cardio: regular rate and rhythm GI: soft with postive BS but diffuse tenderness that is greatest in the suprapubic Tina [name] and LUQ.   Lab Results:   Basename 02/28/12 0550 02/27/12 1002  WBC 7.4 10.1  HGB 9.6* 12.3  HCT 29.4* 37.0  PLT 117* 142*   BMET  Basename 02/28/12 0550 02/27/12 1002  NA 135 139  K 4.9 4.7  CL 103 104  CO2 23 19  GLUCOSE 203* 179*  BUN 28* 31*  CREATININE 1.89* 1.69*  CALCIUM 7.8* 9.0   PT/INR  Basename 02/27/12 1101  LABPROT 13.5  INR 1.04   ABG No results found for this basename: PHART:2,PCO2:2,PO2:2,HCO3:2 in the last 72 hours  Studies/Results: Ct Abdomen Pelvis W Contrast  02/27/2012  *RADIOLOGY REPORT*  Clinical Data: Lower abdominal pain and guarding.  Nausea and vomiting.  CT ABDOMEN AND PELVIS WITH CONTRAST  Technique:  Multidetector CT imaging of the  abdomen and pelvis was performed following the standard protocol during bolus administration of intravenous contrast.  Contrast: 75mL OMNIPAQUE IOHEXOL 300 MG/ML  SOLN  Comparison: 02/01/2012  Findings: Moderate left hydronephrosis and left perinephric stranding is seen.  A 4 mm calculus is seen at the left ureteropelvic junction.  No other ureteral calculi or dilatation seen.  A 6 mm nonobstructing calculus is again seen in the lower pole of the left kidney.  The right kidney is normal in appearance.  Foley catheter is seen within the urinary bladder which is empty. Prior hysterectomy noted.  Adnexae are unremarkable.  Tiny calcified gallstones are seen, however there is no evidence of acute cholecystitis.  The liver, spleen, pancreas, and adrenal glands are normal appearance.  There is no evidence of lymphadenopathy.  No evidence of dilated bowel loops or hernia.  IMPRESSION:  1.  Moderate left hydronephrosis and perinephric stranding due to 4 mm calculus at the left ureteral pelvic junction. 2.  6 mm nonobstructing calculus lower pole left kidney. 3.  Cholelithiasis, without evidence of cholecystitis.   Original Report Authenticated By: Danae Orleans, M.D.    Dg Abd Acute W/chest  02/27/2012  *RADIOLOGY REPORT*  Clinical Data: Abdominal pain.  ACUTE ABDOMEN SERIES (ABDOMEN 2 VIEW & CHEST 1 VIEW)  Comparison: 02/01/2012  Findings: Moderate stool burden throughout the colon. There is normal bowel gas pattern.  No free air.  No organomegaly or suspicious calcification.  No acute  bony abnormality.  Mild cardiomegaly and vascular congestion.  Low lung volumes.  No confluent opacities or effusions.  No acute bony abnormality.  IMPRESSION: Moderate stool burden.  No bowel obstruction or free air.  Cardiomegaly, vascular congestion.   Original Report Authenticated By: Cyndie Chime, M.D.     Anti-infectives: Anti-infectives     Start     Dose/Rate Route Frequency Ordered Stop   02/27/12 2300    ampicillin-sulbactam (UNASYN) 1.5 g in sodium chloride 0.9 % 50 mL IVPB        1.5 g 100 mL/hr over 30 Minutes Intravenous Every 8 hours 02/27/12 2123     02/27/12 2200   ciprofloxacin (CIPRO) IVPB 200 mg        200 mg 100 mL/hr over 60 Minutes Intravenous Every 12 hours 02/27/12 2123     02/27/12 1415   Ampicillin-Sulbactam (UNASYN) 3 g in sodium chloride 0.9 % 100 mL IVPB        3 g 100 mL/hr over 60 Minutes Intravenous  Once 02/27/12 1409 02/27/12 1625   02/27/12 1330   cefTRIAXone (ROCEPHIN) 1 g in dextrose 5 % 50 mL IVPB        1 g 100 mL/hr over 30 Minutes Intravenous  Once 02/27/12 1317 02/27/12 1455          Current Facility-Administered Medications  Medication Dose Route Frequency Provider Last Rate Last Dose  . 0.45 % NaCl with KCl 20 mEq / L infusion   Intravenous Continuous Anner Crete, MD 100 mL/hr at 02/27/12 2248    . 0.9 %  sodium chloride infusion   Intravenous Once Derwood Kaplan, MD 100 mL/hr at 02/27/12 1156    . acetaminophen (TYLENOL) suppository 650 mg  650 mg Rectal Once Derwood Kaplan, MD   650 mg at 02/27/12 1504  . acetaminophen (TYLENOL) tablet 650 mg  650 mg Oral Q4H PRN Anner Crete, MD      . ALPRAZolam Prudy Feeler) tablet 0.25 mg  0.25 mg Oral Daily Anner Crete, MD   0.25 mg at 02/27/12 2249  . ampicillin-sulbactam (UNASYN) 1.5 g in sodium chloride 0.9 % 50 mL IVPB  1.5 g Intravenous Q8H Anner Crete, MD   1.5 g at 02/28/12 0646  . Ampicillin-Sulbactam (UNASYN) 3 g in sodium chloride 0.9 % 100 mL IVPB  3 g Intravenous Once Derwood Kaplan, MD   3 g at 02/27/12 1525  . bisacodyl (DULCOLAX) suppository 10 mg  10 mg Rectal Daily PRN Anner Crete, MD      . cefTRIAXone (ROCEPHIN) 1 g in dextrose 5 % 50 mL IVPB  1 g Intravenous Once Derwood Kaplan, MD   1 g at 02/27/12 1421  . cholecalciferol (VITAMIN D) tablet 1,000 Units  1,000 Units Oral Daily Anner Crete, MD   1,000 Units at 02/27/12 2250  . ciprofloxacin (CIPRO) IVPB 200 mg  200 mg Intravenous Q12H Anner Crete, MD   200 mg at 02/27/12 2248  . citalopram (CELEXA) tablet 20 mg  20 mg Oral Daily Anner Crete, MD   20 mg at 02/27/12 2250  . diclofenac sodium (VOLTAREN) 1 % transdermal gel 2 g  2 g Topical QID PRN Anner Crete, MD      . diltiazem [facility]) 24 hr capsule 120 mg  120 mg Oral Daily Anner Crete, MD   120 mg at 02/27/12 2250  . docusate sodium (COLACE) capsule 100 mg  100 mg Oral BID Jonny Ruiz  Ranelle Oyster, MD   100 mg at 02/27/12 2249  . fentaNYL (SUBLIMAZE) injection 25-50 mcg  25-50 mcg Intravenous Q1H PRN Anner Crete, MD      . HYDROmorphone (DILAUDID) 1 MG/ML injection           . HYDROmorphone (DILAUDID) injection 1 mg  1 mg Intravenous Once Derwood Kaplan, MD   1 mg at 02/27/12 1018  . HYDROmorphone (DILAUDID) injection 1 mg  1 mg Intravenous Once Ankit Nanavati, MD   1 mg at 02/27/12 1631  . hyoscyamine (LEVSIN SL) SL tablet 0.125 mg  0.125 mg Oral Q4H PRN Anner Crete, MD      . insulin aspart (novoLOG) injection 0-15 Units  0-15 Units Subcutaneous TID WC Anner Crete, MD   3 Units at 02/28/12 [phone]  . insulin glargine (LANTUS) injection 24 Units  24 Units Subcutaneous QHS Kathlen Mody, MD      . iohexol (OMNIPAQUE) 300 MG/ML solution 20 mL  20 mL Oral Q1 Hr x 2 Medication Radiologist, MD   20 mL at 02/27/12 1200  . iohexol (OMNIPAQUE) 300 MG/ML solution 75 mL  75 mL Intravenous Once PRN Medication Radiologist, MD   75 mL at 02/27/12 1341  . meclizine (ANTIVERT) tablet 12.5 mg  12.5 mg Oral TID PRN Anner Crete, MD      . multivitamin with minerals tablet 1 tablet  1 tablet Oral Daily Anner Crete, MD   1 tablet at 02/27/12 2250  . ondansetron (ZOFRAN) injection 4 mg  4 mg Intravenous Once Derwood Kaplan, MD   4 mg at 02/27/12 1018  . ondansetron (ZOFRAN) injection 4 mg  4 mg Intravenous Once Derwood Kaplan, MD   4 mg at 02/27/12 1231  . ondansetron (ZOFRAN) injection 4 mg  4 mg Intravenous Once Derwood Kaplan, MD   4 mg at 02/27/12 1631  . ondansetron (ZOFRAN) injection 4 mg  4 mg  Intravenous Q4H PRN Anner Crete, MD      . pregabalin (LYRICA) capsule 75 mg  75 mg Oral BID Anner Crete, MD   75 mg at 02/27/12 2249  . sodium chloride 0.9 % bolus 1,000 mL  1,000 mL Intravenous Once Ankit Nanavati, MD   1,000 mL at 02/27/12 1012  . traMADol (ULTRAM) tablet 50 mg  50 mg Oral Q6H PRN Anner Crete, MD      . zolpidem [facility]) tablet 5 mg  5 mg Oral QHS PRN Anner Crete, MD      . DISCONTD: 0.9 % irrigation (POUR BTL)    PRN Anner Crete, MD   1,000 mL at 02/27/12 1845  . DISCONTD: acetaminophen (TYLENOL) suppository 325 mg  325 mg Rectal Once Derwood Kaplan, MD      . DISCONTD: diatrizoate meglumine (CYSTOGRAFIN/HYPAQUE) 30 % solution    PRN Anner Crete, MD   300 mL at 02/27/12 1845  . DISCONTD: fentaNYL (SUBLIMAZE) 0.05 MG/ML injection           . DISCONTD: fentaNYL (SUBLIMAZE) injection 25-50 mcg  25-50 mcg Intravenous Q5 min PRN Einar Pheasant, MD      . DISCONTD: HYDROmorphone (DILAUDID) injection 0.25-0.5 mg  0.25-0.5 mg Intravenous Q5 min PRN Einar Pheasant, MD   0.25 mg at 02/27/12 2013  . DISCONTD: lactated ringers infusion   Intravenous Continuous Einar Pheasant, MD      . DISCONTD: promethazine (PHENERGAN) injection 6.25-12.5 mg  6.25-12.5 mg Intravenous Q15 min PRN Lyn Hollingshead  F Fortune, MD      . DISCONTD: sodium chloride irrigation 0.9 %    PRN Anner Crete, MD   3,000 mL at 02/27/12 1848  . DISCONTD: temazepam (RESTORIL) capsule 7.5 mg  7.5 mg Oral QHS PRN Anner Crete, MD       Facility-Administered Medications Ordered in Other Encounters  Medication Dose Route Frequency Provider Last Rate Last Dose  . DISCONTD: 0.9 %  sodium chloride infusion    Continuous PRN Elesa Massed, CRNA      . DISCONTD: fentaNYL (SUBLIMAZE) injection    PRN Elesa Massed, CRNA   50 mcg at 02/27/12 1833  . DISCONTD: lactated ringers infusion    Continuous PRN Elesa Massed, CRNA      . DISCONTD: midazolam (VERSED) 5 MG/5ML injection    PRN Elesa Massed, CRNA   1 mg at  02/27/12 1833  . DISCONTD: propofol (DIPRIVAN) 10 mg/mL bolus/IV push    PRN Elesa Massed, CRNA   20 mg at 02/27/12 1857    Assessment: s/p Procedure(s): CYSTOSCOPY WITH RETROGRADE PYELOGRAM/URETERAL STENT PLACEMENT  She is doing better post stenting but her Cr. Bumped which is consistent with her near septic state on admission. She has a mild thrombocytopenia and anemia that is probably chronic but unmasked by hydration. Her cultures are pending.   Plan: Continue IV antibiotics pending the culture. D/C foley catheter. CBC and BMP in am. She will need left ureteroscopy for definitive stone management when she recovers from this acute episode.    LOS: 1 day    Lovett Coffin J 02/28/2012

## 2012-02-29 ENCOUNTER — Inpatient Hospital Stay (HOSPITAL_COMMUNITY): Payer: Medicare HMO

## 2012-02-29 LAB — BASIC METABOLIC PANEL
BUN: 29 mg/dL — ABNORMAL HIGH (ref 6–23)
Calcium: 7.8 mg/dL — ABNORMAL LOW (ref 8.4–10.5)
GFR calc non Af Amer: 25 mL/min — ABNORMAL LOW (ref >90)
Glucose, Bld: 115 mg/dL — ABNORMAL HIGH (ref 70–99)
Sodium: 135 mEq/L (ref 135–145)

## 2012-02-29 LAB — URINE CULTURE: Colony Count: >=100000

## 2012-02-29 LAB — GLUCOSE, CAPILLARY: Glucose-Capillary: 122 mg/dL — ABNORMAL HIGH (ref 70–99)

## 2012-02-29 LAB — CBC
Hemoglobin: 9.8 g/dL — ABNORMAL LOW (ref 12.0–15.0)
MCH: 27.7 pg (ref 26.0–34.0)
MCHC: 32.9 g/dL (ref 30.0–36.0)

## 2012-02-29 NOTE — Progress Notes (Signed)
Patient ID: Tina [name], female   DOB: 1940-10-16, 71 y.o.   MRN: [id number]  [facility] PROGRESS NOTE  Tina [name] WUJ:[id number] DOB: 1940-10-16 DOA: 02/27/2012 PCP: Carney Living, MD  Brief narrative:  Pt admitted 10/18 under Dr. Annabell Howells care for further management of left obstructing ureteropelvic stone  Active Problems:  1. Hydronephrosis/ Left obstructing ureteropelvic stone with pyelonephritis - s/p cystoscopy with retrograde pyelogram and ureteral stent placement. Further management as per urology. Continue current antibiotic Unasyn day #3, IVF, pain control 2. Hypertension: Improved but slightly above target, continue current medication regimen 3. Acute on chronic renal failure - review of records indicate baseline Cr 1.6 - 1.7, continue IVF for now, BMP in AM 4. Fever - Tmax 100.2 F, unclear etiology, final urine culture and sensitivity still pending, I will check CXR as her breath sounds are decreased and she has crackles at bases  5. Anemia of chronic disease - review of record indicate baseline Hg 9-10 and pt currently with Hg at baseline, CBC in AM 6. ? Vascular congestion per CXR 10/18, will repeat CXR today as pt is still receiving IVF for renal failure, will check BNP 7. Diabetes mellitus - continue SSI for now 8. Thrombocytopenia - SCD for DVT prophylaxis  Procedures/Studies:  10/19/21013 - Dr. Annabell Howells  CYSTOSCOPY WITH RETROGRADE PYELOGRAM/URETERAL STENT PLACEMENT (Left)  Ct Abdomen Pelvis W Contrast  02/27/2012  MPRESSION:  1. Moderate left hydronephrosis and perinephric stranding due to 4 mm calculus at the left ureteral pelvic junction.  2. 6 mm nonobstructing calculus lower pole left kidney.  3. Cholelithiasis, without evidence of cholecystitis.  Dg Abd Acute W/chest  02/27/2012  IMPRESSION:  Moderate stool burden. No bowel obstruction or free air. Cardiomegaly, vascular congestion.   Antibiotics:  Unasyn 10/18 -->  Ciprofloxacin 10/18  -->  Code Status: Full  Family Communication: Pt at bedside  Disposition Plan: Home when medically stable    HPI/Subjective: No events overnight.   Objective: Filed Vitals:   02/28/12 1401 02/28/12 1716 02/28/12 2150 02/29/12 0523  BP: 132/45  127/58 115/56  Pulse: 94  99 88  Temp: 99.3 F (37.4 C)  100.2 F (37.9 C) 99.6 F (37.6 C)  TempSrc: Oral  Oral Oral  Resp: 19  20 20   Height:  5\' 5"  (1.651 m)    Weight:  116.8 kg (257 lb 8 oz)    SpO2: 93%  94% 93%    Intake/Output Summary (Last 24 hours) at 02/29/12 1201 Last data filed at 02/29/12 1034  Gross per 24 hour  Intake 1931.67 ml  Output    725 ml  Net 1206.67 ml    Exam:   General:  Pt is somnolent but opens eyes and communicated when awake, follows commands appropriately  Cardiovascular: Regular rate and rhythm, S1/S2, no murmurs, no rubs, no gallops  Respiratory: Clear to auscultation bilaterally, decreased breath sounds at bases, crackles at bases  Abdomen: Soft, tender in lower quadrants, non distended, bowel sounds present, no guarding  Extremities: No edema, pulses DP and PT palpable bilaterally  Neuro: Grossly nonfocal but somnolent  Data Reviewed: Basic Metabolic Panel:  Lab 02/29/12 9562 02/28/12 0550 02/27/12 1002  NA 135 135 139  K 4.7 4.9 4.7  CL 104 103 104  CO2 20 23 19   GLUCOSE 115* 203* 179*  BUN 29* 28* 31*  CREATININE 1.96* 1.89* 1.69*  CALCIUM 7.8* 7.8* 9.0  MG -- -- --  PHOS -- -- --   Liver Function Tests:  Lab 02/27/12  1002  AST 15  ALT 9  ALKPHOS 118*  BILITOT 0.5  PROT 7.4  ALBUMIN 3.2*   No results found for this basename: LIPASE:5,AMYLASE:5 in the last 168 hours No results found for this basename: AMMONIA:5 in the last 168 hours CBC:  Lab 02/29/12 0517 02/28/12 0550 02/27/12 1002  WBC 8.9 7.4 10.1  NEUTROABS -- -- 8.0*  HGB 9.8* 9.6* 12.3  HCT 29.8* 29.4* 37.0  MCV 84.2 84.0 84.5  PLT 115* 117* 142*   Cardiac Enzymes:  Lab 02/27/12 1002  CKTOTAL --   CKMB --  CKMBINDEX --  TROPONINI <0.30   BNP: No components found with this basename: POCBNP:5 CBG:  Lab 02/29/12 1148 02/29/12 0734 02/28/12 2053 02/28/12 1659 02/28/12 1152  GLUCAP 124* 122* 142* 194* 164*    Recent Results (from the past 240 hour(s))  URINE CULTURE     Status: Normal (Preliminary result)   Collection Time   02/27/12 10:36 AM      Component Value Range Status Comment   Specimen Description URINE, CLEAN CATCH   Final    Special Requests NONE   Final    Culture  Setup Time 02/27/2012 11:46   Final    Colony Count >=100,000 COLONIES/ML   Final    Culture GRAM NEGATIVE RODS   Final    Report Status PENDING   Incomplete   URINE CULTURE     Status: Normal (Preliminary result)   Collection Time   02/27/12 11:43 AM      Component Value Range Status Comment   Specimen Description URINE, CATHETERIZED   Final    Special Requests ADDED 02/27/12 1415   Final    Culture  Setup Time 02/27/2012 14:44   Final    Colony Count >=100,000 COLONIES/ML   Final    Culture GRAM NEGATIVE RODS   Final    Report Status PENDING   Incomplete   CULTURE, BLOOD (ROUTINE X 2)     Status: Normal (Preliminary result)   Collection Time   02/27/12  3:15 PM      Component Value Range Status Comment   Specimen Description BLOOD FOREARM RIGHT   Final    Special Requests BOTTLES DRAWN AEROBIC ONLY 3CC PT ON ROCEPHIN   Final    Culture  Setup Time 02/27/2012 21:42   Final    Culture     Final    Value:        BLOOD CULTURE RECEIVED NO GROWTH TO DATE CULTURE WILL BE HELD FOR 5 DAYS BEFORE ISSUING A FINAL NEGATIVE REPORT   Report Status PENDING   Incomplete   CULTURE, BLOOD (ROUTINE X 2)     Status: Normal (Preliminary result)   Collection Time   02/27/12  3:23 PM      Component Value Range Status Comment   Specimen Description BLOOD HAND RIGHT   Final    Special Requests BOTTLES DRAWN AEROBIC ONLY 10CC PT ON ROCEPHIN   Final    Culture  Setup Time 02/27/2012 21:42   Final    Culture      Final    Value:        BLOOD CULTURE RECEIVED NO GROWTH TO DATE CULTURE WILL BE HELD FOR 5 DAYS BEFORE ISSUING A FINAL NEGATIVE REPORT   Report Status PENDING   Incomplete   URINE CULTURE     Status: Normal (Preliminary result)   Collection Time   02/27/12  6:59 PM      Component Value Range  Status Comment   Specimen Description URINE, RANDOM FROM LEFT LOWER POLE   Final    Special Requests NONE   Final    Culture  Setup Time 02/28/2012 02:17   Final    Colony Count >=100,000 COLONIES/ML   Final    Culture GRAM NEGATIVE RODS   Final    Report Status PENDING   Incomplete      Scheduled Meds:   . ALPRAZolam  0.25 mg Oral Daily  . ampicillin-sulbactam (UNASYN) IV  1.5 g Intravenous Q8H  . cholecalciferol  1,000 Units Oral Daily  . ciprofloxacin  200 mg Intravenous Q12H  . citalopram  20 mg Oral Daily  . diltiazem  120 mg Oral Daily  . docusate sodium  100 mg Oral BID  . insulin aspart  0-15 Units Subcutaneous TID WC  . insulin glargine  24 Units Subcutaneous QHS  . multivitamin with minerals  1 tablet Oral Daily  . pregabalin  75 mg Oral BID   Continuous Infusions:   . 0.45 % NaCl with KCl 20 mEq / L 100 mL/hr at 02/29/12 0008     Debbora Presto, MD  [facility] Pager [phone]  If 7PM-7AM, please contact night-coverage [email or web address] Password [email or web address] 02/29/2012, 12:01 PM   LOS: 2 days

## 2012-02-29 NOTE — Progress Notes (Signed)
Patient ID: Tina [name], female   DOB: July 27, 1940, 71 y.o.   MRN: [id number] 2 Days Post-Op  Subjective: Nalayah continues to report pain in the left flank and weakness.  She denies fever or nausea.  She doesn't want to get out of bed.  She has been voiding since the foley was removed.  Her culture grew GNR but sensitivities are pending.  She will need ureteroscopy to remove the left renal and UPJ stones when she has recovered from the infection.  ROS: Negative except as above.  She has no CP, SOB or calf tenderness.   Objective: Vital signs in last 24 hours: Temp:  [99.3 F (37.4 C)-100.2 F (37.9 C)] 99.6 F (37.6 C) (10/20 0523) Pulse Rate:  [88-99] 88  (10/20 0523) Resp:  [19-20] 20  (10/20 0523) BP: (115-132)/(45-58) 115/56 mmHg (10/20 0523) SpO2:  [93 %-94 %] 93 % (10/20 0523) Weight:  [116.8 kg (257 lb 8 oz)] 116.8 kg (257 lb 8 oz) (10/19 1716)  Intake/Output from previous day: 10/19 0701 - 10/20 0700 In: 2366.7 [P.O.:660; I.V.:1406.7; IV Piggyback:300] Out: 1026 [Urine:1025; Stool:1] Intake/Output this shift:    General appearance: alert, no distress and moderately obese Resp: clear to auscultation bilaterally Cardio: regular rate and rhythm GI: soft with +BS.  She has LUQ and LCVA tenderness along with suprapubic tenderness. Ext: Calves are non-tender.   Lab Results:   [facility] 02/29/12 0517 02/28/12 0550  WBC 8.9 7.4  HGB 9.8* 9.6*  HCT 29.8* 29.4*  PLT 115* 117*   BMET  Basename 02/29/12 0517 02/28/12 0550  NA 135 135  K 4.7 4.9  CL 104 103  CO2 20 23  GLUCOSE 115* 203*  BUN 29* 28*  CREATININE 1.96* 1.89*  CALCIUM 7.8* 7.8*   PT/INR  Basename 02/27/12 1101  LABPROT 13.5  INR 1.04   ABG No results found for this basename: PHART:2,PCO2:2,PO2:2,HCO3:2 in the last 72 hours  Studies/Results: Ct Abdomen Pelvis W Contrast  02/27/2012  *RADIOLOGY REPORT*  Clinical Data: Lower abdominal pain and guarding.  Nausea and vomiting.  CT ABDOMEN AND  PELVIS WITH CONTRAST  Technique:  Multidetector CT imaging of the abdomen and pelvis was performed following the standard protocol during bolus administration of intravenous contrast.  Contrast: 75mL OMNIPAQUE IOHEXOL 300 MG/ML  SOLN  Comparison: 02/01/2012  Findings: Moderate left hydronephrosis and left perinephric stranding is seen.  A 4 mm calculus is seen at the left ureteropelvic junction.  No other ureteral calculi or dilatation seen.  A 6 mm nonobstructing calculus is again seen in the lower pole of the left kidney.  The right kidney is normal in appearance.  Foley catheter is seen within the urinary bladder which is empty. Prior hysterectomy noted.  Adnexae are unremarkable.  Tiny calcified gallstones are seen, however there is no evidence of acute cholecystitis.  The liver, spleen, pancreas, and adrenal glands are normal appearance.  There is no evidence of lymphadenopathy.  No evidence of dilated bowel loops or hernia.  IMPRESSION:  1.  Moderate left hydronephrosis and perinephric stranding due to 4 mm calculus at the left ureteral pelvic junction. 2.  6 mm nonobstructing calculus lower pole left kidney. 3.  Cholelithiasis, without evidence of cholecystitis.   Original Report Authenticated By: Danae Orleans, M.D.    Dg Abd Acute W/chest  02/27/2012  *RADIOLOGY REPORT*  Clinical Data: Abdominal pain.  ACUTE ABDOMEN SERIES (ABDOMEN 2 VIEW & CHEST 1 VIEW)  Comparison: 02/01/2012  Findings: Moderate stool burden throughout the colon.  There is normal bowel gas pattern.  No free air.  No organomegaly or suspicious calcification.  No acute bony abnormality.  Mild cardiomegaly and vascular congestion.  Low lung volumes.  No confluent opacities or effusions.  No acute bony abnormality.  IMPRESSION: Moderate stool burden.  No bowel obstruction or free air.  Cardiomegaly, vascular congestion.   Original Report Authenticated By: Cyndie Chime, M.D.     Anti-infectives: Anti-infectives     Start      Dose/Rate Route Frequency Ordered Stop   02/27/12 2300   ampicillin-sulbactam (UNASYN) 1.5 g in sodium chloride 0.9 % 50 mL IVPB        1.5 g 100 mL/hr over 30 Minutes Intravenous Every 8 hours 02/27/12 2123     02/27/12 2200   ciprofloxacin (CIPRO) IVPB 200 mg        200 mg 100 mL/hr over 60 Minutes Intravenous Every 12 hours 02/27/12 2123     02/27/12 1415   Ampicillin-Sulbactam (UNASYN) 3 g in sodium chloride 0.9 % 100 mL IVPB        3 g 100 mL/hr over 60 Minutes Intravenous  Once 02/27/12 1409 02/27/12 1625   02/27/12 1330   cefTRIAXone (ROCEPHIN) 1 g in dextrose 5 % 50 mL IVPB        1 g 100 mL/hr over 30 Minutes Intravenous  Once 02/27/12 1317 02/27/12 1455          Current Facility-Administered Medications  Medication Dose Route Frequency Provider Last Rate Last Dose  . 0.45 % NaCl with KCl 20 mEq / L infusion   Intravenous Continuous Anner Crete, MD 100 mL/hr at 02/29/12 0008    . acetaminophen (TYLENOL) tablet 650 mg  650 mg Oral Q4H PRN Anner Crete, MD   650 mg at 02/28/12 2203  . ALPRAZolam Prudy Feeler) tablet 0.25 mg  0.25 mg Oral Daily Anner Crete, MD   0.25 mg at 02/28/12 1009  . ampicillin-sulbactam (UNASYN) 1.5 g in sodium chloride 0.9 % 50 mL IVPB  1.5 g Intravenous Q8H Anner Crete, MD   1.5 g at 02/29/12 0543  . bisacodyl (DULCOLAX) suppository 10 mg  10 mg Rectal Daily PRN Anner Crete, MD      . cholecalciferol (VITAMIN D) tablet 1,000 Units  1,000 Units Oral Daily Anner Crete, MD   1,000 Units at 02/28/12 1009  . ciprofloxacin (CIPRO) IVPB 200 mg  200 mg Intravenous Q12H Anner Crete, MD   200 mg at 02/28/12 2234  . citalopram (CELEXA) tablet 20 mg  20 mg Oral Daily Anner Crete, MD   20 mg at 02/28/12 1009  . diclofenac sodium (VOLTAREN) 1 % transdermal gel 2 g  2 g Topical QID PRN Anner Crete, MD      . diltiazem [facility]) 24 hr capsule 120 mg  120 mg Oral Daily Anner Crete, MD   120 mg at 02/28/12 1009  . docusate sodium (COLACE) capsule 100 mg  100 mg Oral  BID Anner Crete, MD   100 mg at 02/28/12 1009  . fentaNYL (SUBLIMAZE) injection 25-50 mcg  25-50 mcg Intravenous Q1H PRN Anner Crete, MD      . hyoscyamine (LEVSIN SL) SL tablet 0.125 mg  0.125 mg Oral Q4H PRN Anner Crete, MD   0.125 mg at 02/28/12 2203  . insulin aspart (novoLOG) injection 0-15 Units  0-15 Units Subcutaneous TID WC Anner Crete, MD   2 Units  at 02/29/12 0857  . insulin glargine (LANTUS) injection 24 Units  24 Units Subcutaneous QHS Kathlen Mody, MD   24 Units at 02/28/12 2204  . meclizine (ANTIVERT) tablet 12.5 mg  12.5 mg Oral TID PRN Anner Crete, MD      . multivitamin with minerals tablet 1 tablet  1 tablet Oral Daily Anner Crete, MD   1 tablet at 02/28/12 1009  . ondansetron (ZOFRAN) injection 4 mg  4 mg Intravenous Q4H PRN Anner Crete, MD      . pregabalin (LYRICA) capsule 75 mg  75 mg Oral BID Anner Crete, MD   75 mg at 02/28/12 2203  . traMADol (ULTRAM) tablet 50 mg  50 mg Oral Q6H PRN Anner Crete, MD   50 mg at 02/29/12 0910  . zolpidem (AMBIEN) tablet 5 mg  5 mg Oral QHS PRN Anner Crete, MD   5 mg at 02/28/12 2203    Assessment: s/p Procedure(s): CYSTOSCOPY WITH RETROGRADE PYELOGRAM/URETERAL STENT PLACEMENT  She still has a low grade fever and pain. Her culture is growing GNR >100000 but sensitivities are pending.  She is still very weak.  Her Cr has continued to rise.  Plan: I will convert her to inpatient status and continue IV antibiotics pending her final culture results. PT consult. Per pharmacy, she could go back on the Xarelto. She is going to need ureteroscopy for the stones, but that won't be before Thursday at the earliest and probably will be longer before it can be scheduled.  She will need to be off of the Xarelto if restarted for an appropriate period prior to her ureterscopy.     LOS: 2 days    Anner Crete 02/29/2012

## 2012-03-01 ENCOUNTER — Other Ambulatory Visit: Payer: Self-pay | Admitting: Urology

## 2012-03-01 ENCOUNTER — Encounter (HOSPITAL_COMMUNITY): Payer: Self-pay | Admitting: Urology

## 2012-03-01 LAB — BASIC METABOLIC PANEL
BUN: 33 mg/dL — ABNORMAL HIGH (ref 6–23)
Chloride: 103 mEq/L (ref 96–112)
GFR calc Af Amer: 28 mL/min — ABNORMAL LOW (ref >90)
GFR calc non Af Amer: 24 mL/min — ABNORMAL LOW (ref >90)
Potassium: 5 mEq/L (ref 3.5–5.1)
Sodium: 135 mEq/L (ref 135–145)

## 2012-03-01 LAB — CBC
MCHC: 32.5 g/dL (ref 30.0–36.0)
Platelets: 138 10*3/uL — ABNORMAL LOW (ref 150–400)
RDW: 15.4 % (ref 11.5–15.5)
WBC: 7.2 10*3/uL (ref 4.0–10.5)

## 2012-03-01 LAB — URINE CULTURE

## 2012-03-01 LAB — GLUCOSE, CAPILLARY: Glucose-Capillary: 86 mg/dL (ref 70–99)

## 2012-03-01 MED ORDER — FUROSEMIDE 10 MG/ML IJ SOLN
20.0000 mg | Freq: Once | INTRAMUSCULAR | Status: DC
  Filled 2012-03-01: qty 2

## 2012-03-01 MED ORDER — FUROSEMIDE 10 MG/ML IJ SOLN
40.0000 mg | Freq: Once | INTRAMUSCULAR | Status: AC
  Administered 2012-03-01: 40 mg via INTRAVENOUS
  Filled 2012-03-01: qty 4

## 2012-03-01 MED ORDER — PIPERACILLIN-TAZOBACTAM 3.375 G IVPB
3.3750 g | Freq: Three times a day (TID) | INTRAVENOUS | Status: DC
  Administered 2012-03-01 – 2012-03-10 (×28): 3.375 g via INTRAVENOUS
  Filled 2012-03-01 (×32): qty 50

## 2012-03-01 NOTE — Clinical Documentation Improvement (Signed)
BMI DOCUMENTATION CLARIFICATION QUERY  THIS DOCUMENT IS NOT A PERMANENT PART OF THE MEDICAL RECORD  TO RESPOND TO THE THIS QUERY, FOLLOW THE INSTRUCTIONS BELOW:  1. If needed, update documentation for the patient's encounter via the notes activity.  2. Access this query again and click edit on the In [employer].  3. After updating, or not, click F2 to complete all highlighted (required) fields concerning your review. Select "additional documentation in the medical record" OR "no additional documentation provided".  4. Click Sign note button.  5. The deficiency will fall out of your In Basket *Please let us know if you are not able to complete this workflow by phone or e-mail (listed below).         03/01/12  Dear Dr. Annabell Howells, [facility] [place]  In an effort to better capture your patient's severity of illness, reflect appropriate length of stay and utilization of resources, a review of the patient medical record has revealed the following indicators.    Based on your clinical judgment, please clarify and document in a progress note and/or discharge summary the clinical condition associated with the following supporting information:  In responding to this query please exercise your independent judgment.  The fact that a query is asked, does not imply that any particular answer is desired or expected.  Based on your clinical judgment can you provide a diagnosis that represents the below listed clinical indicators?  In this patient admitted with Enterobacter pyelonephritis a review of the medical record reveals the following:   BMI=42.85  WT-257 lbs  HT=5"  Diet Carb Modified  Please clarify if the above clinical indicators qualify for one of the diagnoses listed below and document in the pn or d/c summary.     Possible Clinical conditions  Morbid Obesity W/ BMI=   Underweight w/BMI=  Other condition___________________  Cannot Clinically determine _____________  Risk  Factors: Enterobacter pyelonephritis, left UPJ stone w/ obstruction, H/O Stroke, TID, AFIB, DM, HLD Sign & Symptoms:    Reviewed:  no additional documentation provided  Thank You,  Enis Slipper  RN, BSN, MSN/Inf, CCDS Clinical Documentation Specialist [facility] Dept Pager: [phone] / E-mail: [email or web address] Health Information Management [place]

## 2012-03-01 NOTE — Progress Notes (Signed)
Patient ID: Tina [name], female   DOB: Jul 29, 1940, 71 y.o.   MRN: [id number] 3 Days Post-Op  Subjective: Tina [name] remains afebrile.  Her culture has grown enterobacter sensitive only to zosyn.  She has reduced abdominal pain, but her mobility is very limited.  She still needs left ureteroscopy for definitive stone removal.  ROS: Negative except for leg pain especially in her left knee.  Objective: Vital signs in last 24 hours: Temp:  [98 F (36.7 C)-99.2 F (37.3 C)] 98.4 F (36.9 C) (10/21 0505) Pulse Rate:  [81-111] 81  (10/21 0505) Resp:  [18-20] 18  (10/21 0505) BP: (120-150)/(63-88) 150/88 mmHg (10/21 0505) SpO2:  [90 %-91 %] 90 % (10/21 0505)  Intake/Output from previous day: 10/20 0701 - 10/21 0700 In: 2078 [P.O.:700; I.V.:1028; IV Piggyback:350] Out: 500 [Urine:500] Intake/Output this shift:    General appearance: alert and no distress Resp: clear to auscultation bilaterally Cardio: irregularly irregular rhythm GI: soft with +BS.  There is some RLQ and LUQ tenderness.  Ext: No edema or calf tenderness.   Lab Results:   [facility] 03/01/12 0535 02/29/12 0517  WBC 7.2 8.9  HGB 10.0* 9.8*  HCT 30.8* 29.8*  PLT 138* 115*   BMET  Basename 03/01/12 0535 02/29/12 0517  NA 135 135  K 5.0 4.7  CL 103 104  CO2 20 20  GLUCOSE 119* 115*  BUN 33* 29*  CREATININE 2.02* 1.96*  CALCIUM 8.2* 7.8*   PT/INR  Basename 02/27/12 1101  LABPROT 13.5  INR 1.04   Urine culture reviewed and noted above.   ABG No results found for this basename: PHART:2,PCO2:2,PO2:2,HCO3:2 in the last 72 hours  Studies/Results: Dg Chest Port 1 View  02/29/2012  *RADIOLOGY REPORT*  Clinical Data: CHF, shortness of breath, weakness  PORTABLE CHEST - 1 VIEW  Comparison: 02/01/2012; 07/06/2011  Findings:  Grossly unchanged enlarged cardiac silhouette and mediastinal contours.  Pulmonary vasculature is indistinct with cephalization of flow.  Grossly unchanged perihilar and bibasilar  heterogeneous opacities.  No definite pleural effusion or pneumothorax. Unchanged bones.  IMPRESSION: 1.  Findings compatible with pulmonary edema.  2.  Bibasilar and perihilar opacities favored to represent atelectasis, though underlying infection not excluded.  Continued attention on follow-up is recommended.   Original Report Authenticated By: Waynard Reeds, M.D.     Anti-infectives: Anti-infectives     Start     Dose/Rate Route Frequency Ordered Stop   02/27/12 2300   ampicillin-sulbactam (UNASYN) 1.5 g in sodium chloride 0.9 % 50 mL IVPB  Status:  Discontinued        1.5 g 100 mL/hr over 30 Minutes Intravenous Every 8 hours 02/27/12 2123 03/01/12 0757   02/27/12 2200   ciprofloxacin (CIPRO) IVPB 200 mg  Status:  Discontinued        200 mg 100 mL/hr over 60 Minutes Intravenous Every 12 hours 02/27/12 2123 03/01/12 0757   02/27/12 1415   Ampicillin-Sulbactam (UNASYN) 3 g in sodium chloride 0.9 % 100 mL IVPB        3 g 100 mL/hr over 60 Minutes Intravenous  Once 02/27/12 1409 02/27/12 1625   02/27/12 1330   cefTRIAXone (ROCEPHIN) 1 g in dextrose 5 % 50 mL IVPB        1 g 100 mL/hr over 30 Minutes Intravenous  Once 02/27/12 1317 02/27/12 1455          Current Facility-Administered Medications  Medication Dose Route Frequency Provider Last Rate Last Dose  . 0.45 % NaCl  with KCl 20 mEq / L infusion   Intravenous Continuous Anner Crete, MD 100 mL/hr at 03/01/12 0250    . acetaminophen (TYLENOL) tablet 650 mg  650 mg Oral Q4H PRN Anner Crete, MD   650 mg at 02/29/12 2010  . ALPRAZolam Prudy Feeler) tablet 0.25 mg  0.25 mg Oral Daily Anner Crete, MD   0.25 mg at 02/29/12 1058  . bisacodyl (DULCOLAX) suppository 10 mg  10 mg Rectal Daily PRN Anner Crete, MD      . cholecalciferol (VITAMIN D) tablet 1,000 Units  1,000 Units Oral Daily Anner Crete, MD   1,000 Units at 02/29/12 1058  . citalopram (CELEXA) tablet 20 mg  20 mg Oral Daily Anner Crete, MD   20 mg at 02/29/12 1058  .  diclofenac sodium (VOLTAREN) 1 % transdermal gel 2 g  2 g Topical QID PRN Anner Crete, MD      . diltiazem [facility]) 24 hr capsule 120 mg  120 mg Oral Daily Anner Crete, MD   120 mg at 02/29/12 1058  . docusate sodium (COLACE) capsule 100 mg  100 mg Oral BID Anner Crete, MD   100 mg at 02/29/12 1058  . fentaNYL (SUBLIMAZE) injection 25-50 mcg  25-50 mcg Intravenous Q1H PRN Anner Crete, MD      . hyoscyamine (LEVSIN SL) SL tablet 0.125 mg  0.125 mg Oral Q4H PRN Anner Crete, MD   0.125 mg at 02/28/12 2203  . insulin aspart (novoLOG) injection 0-15 Units  0-15 Units Subcutaneous TID WC Anner Crete, MD   2 Units at 02/29/12 1729  . insulin glargine (LANTUS) injection 24 Units  24 Units Subcutaneous QHS Kathlen Mody, MD   24 Units at 02/29/12 2013  . meclizine (ANTIVERT) tablet 12.5 mg  12.5 mg Oral TID PRN Anner Crete, MD      . multivitamin with minerals tablet 1 tablet  1 tablet Oral Daily Anner Crete, MD   1 tablet at 02/29/12 1058  . ondansetron (ZOFRAN) injection 4 mg  4 mg Intravenous Q4H PRN Anner Crete, MD      . pregabalin (LYRICA) capsule 75 mg  75 mg Oral BID Anner Crete, MD   75 mg at 02/29/12 2012  . traMADol (ULTRAM) tablet 50 mg  50 mg Oral Q6H PRN Anner Crete, MD   50 mg at 02/29/12 1520  . zolpidem (AMBIEN) tablet 5 mg  5 mg Oral QHS PRN Anner Crete, MD   5 mg at 02/29/12 2013  . DISCONTD: ampicillin-sulbactam (UNASYN) 1.5 g in sodium chloride 0.9 % 50 mL IVPB  1.5 g Intravenous Q8H Anner Crete, MD   1.5 g at 03/01/12 0610  . DISCONTD: ciprofloxacin (CIPRO) IVPB 200 mg  200 mg Intravenous Q12H Anner Crete, MD   200 mg at 02/29/12 2014    Assessment: s/p Procedure(s): CYSTOSCOPY WITH RETROGRADE PYELOGRAM/URETERAL STENT PLACEMENT  Enterobacter pyelonephritis with obstructive left renal stone afebrile post stenting.  Deconditioning with poor mobility.   Plan: I will switch her to Zosyn. Plan for left ureteroscopy later this week.  Care Management consult ordered for  discharge planning. PT evaluation pending.    LOS: 3 days    Tarance Balan J 03/01/2012

## 2012-03-01 NOTE — Progress Notes (Addendum)
Patient ID: Tina [name], female   DOB: 1941/04/10, 71 y.o.   MRN: [id number]  [facility] PROGRESS NOTE  Tina [name] QMV:[id number] DOB: 10/31/1940 DOA: 02/27/2012 PCP: Carney Living, MD   Brief narrative:  Pt admitted 10/18 under Dr. Annabell Howells care for further management of left obstructing ureteropelvic stone. [facility] was asked to follow along for management of medical problems.  Active Problems:  1. Hydronephrosis/ Left obstructing ureteropelvic stone with pyelonephritis - s/p cystoscopy with retrograde pyelogram and ureteral stent placement. Further management as per urology. Current antibiotic Unasyn changed today to Zosyn as urine culture indicated sensitivity only to Zosyn. Pt still needs left ureteroscopy for definitive stone removal.  2. Hypertension: Improved and within target range. Continue to monitor vitals per floor protocol. 3. Acute on chronic renal failure - review of records indicate baseline Cr 1.6 - 1.7, creatinine is trending up. Will have to hold of on IVF as CXR indicative of pulmonary edema, along with elevated BNP 2800 and pt has bibasilar crackles. 4. Fever - Tmax 99.6 F, unclear etiology, final urine culture indicative of enterococcal infection sensitive only to Zosyn, will change antibiotic to Zosyn.  5. Anemia of chronic disease - review of record indicate baseline Hg 9-10 and pt currently with Hg at baseline, CBC in AM 6. ? Vascular congestion per CXR 10/20 with elevated BNP and crackles on exam, oxygen saturations 90-92%, will hold off on IVF and may need one dose of Lasix as well, will discuss with Dr. Annabell Howells. 7. Diabetes mellitus - continue SSI for now, CBG's have been stable 8. Thrombocytopenia - SCD for DVT prophylaxis  Procedures/Studies:  10/19/21013 - Dr. Annabell Howells  CYSTOSCOPY WITH RETROGRADE PYELOGRAM/URETERAL STENT PLACEMENT (Left)   Ct Abdomen Pelvis W Contrast  02/27/2012  MPRESSION:  1. Moderate left hydronephrosis and perinephric  stranding due to 4 mm calculus at the left ureteral pelvic junction.  2. 6 mm nonobstructing calculus lower pole left kidney.  3. Cholelithiasis, without evidence of cholecystitis.   Dg Abd Acute W/chest  02/27/2012  IMPRESSION:  Moderate stool burden. No bowel obstruction or free air. Cardiomegaly, vascular congestion.   CXR 02/29/2012 IMPRESSION:  1. Findings compatible with pulmonary edema.  2. Bibasilar and perihilar opacities favored to represent atelectasis, though underlying infection not excluded. Continued attention on follow-up is recommended.  Antibiotics:  Unasyn 10/18 --> 10/21 Ciprofloxacin 10/18 --> 10/21 Zosyn 10/21 -->  Code Status: Full  Family Communication: Pt at bedside  Disposition Plan: Home when medically stable, per primary team   HPI/Subjective: No events overnight.   Objective: Filed Vitals:   02/29/12 0523 02/29/12 1512 02/29/12 2039 03/01/12 0505  BP: 115/56 120/63 128/81 150/88  Pulse: 88 106 111 81  Temp: 99.6 F (37.6 C) 98 F (36.7 C) 99.2 F (37.3 C) 98.4 F (36.9 C)  TempSrc: Oral Oral Oral Oral  Resp: 20 19 20 18   Height:      Weight:      SpO2: 93% 91% 91% 90%    Intake/Output Summary (Last 24 hours) at 03/01/12 1225 Last data filed at 03/01/12 0900  Gross per 24 hour  Intake 2821.33 ml  Output    300 ml  Net 2521.33 ml    Exam:   General:  Pt is alert, follows commands appropriately, not in acute distress  Cardiovascular: Irregular rate and rhythm, no murmurs, no rubs, no gallops  Respiratory: Clear to auscultation bilaterally with bibasilar crackles  Abdomen: Soft, tender in bilateral lower quadrants , non distended, bowel sounds present, no  guarding  Extremities: No edema, pulses DP and PT palpable bilaterally  Neuro: Grossly nonfocal  Data Reviewed: Basic Metabolic Panel:  Lab 03/01/12 1191 02/29/12 0517 02/28/12 0550 02/27/12 1002  NA 135 135 135 139  K 5.0 4.7 4.9 4.7  CL 103 104 103 104  CO2 20 20 23  19   GLUCOSE 119* 115* 203* 179*  BUN 33* 29* 28* 31*  CREATININE 2.02* 1.96* 1.89* 1.69*  CALCIUM 8.2* 7.8* 7.8* 9.0  MG -- -- -- --  PHOS -- -- -- --   Liver Function Tests:  Lab 02/27/12 1002  AST 15  ALT 9  ALKPHOS 118*  BILITOT 0.5  PROT 7.4  ALBUMIN 3.2*   CBC:  Lab 03/01/12 0535 02/29/12 0517 02/28/12 0550 02/27/12 1002  WBC 7.2 8.9 7.4 10.1  NEUTROABS -- -- -- 8.0*  HGB 10.0* 9.8* 9.6* 12.3  HCT 30.8* 29.8* 29.4* 37.0  MCV 84.8 84.2 84.0 84.5  PLT 138* 115* 117* 142*   Cardiac Enzymes:  Lab 02/27/12 1002  CKTOTAL --  CKMB --  CKMBINDEX --  TROPONINI <0.30   CBG:  Lab 03/01/12 1151 03/01/12 0742 03/01/12 0004 02/29/12 2037 02/29/12 1711  GLUCAP 86 125* 126* 130* 122*    Recent Results (from the past 240 hour(s))  URINE CULTURE     Status: Normal   Collection Time   02/27/12 10:36 AM      Component Value Range Status Comment   Specimen Description URINE, CLEAN CATCH   Final    Special Requests NONE   Final    Culture  Setup Time 02/27/2012 11:46   Final    Colony Count >=100,000 COLONIES/ML   Final    Culture ENTEROBACTER CLOACAE   Final    Report Status 02/29/2012 FINAL   Final    Organism ID, Bacteria ENTEROBACTER CLOACAE   Final   URINE CULTURE     Status: Normal   Collection Time   02/27/12 11:43 AM      Component Value Range Status Comment   Specimen Description URINE, CATHETERIZED   Final    Special Requests ADDED 02/27/12 1415   Final    Culture  Setup Time 02/27/2012 14:44   Final    Colony Count >=100,000 COLONIES/ML   Final    Culture ENTEROBACTER CLOACAE   Final    Report Status 02/29/2012 FINAL   Final    Organism ID, Bacteria ENTEROBACTER CLOACAE   Final   CULTURE, BLOOD (ROUTINE X 2)     Status: Normal (Preliminary result)   Collection Time   02/27/12  3:15 PM      Component Value Range Status Comment   Specimen Description BLOOD FOREARM RIGHT   Final    Special Requests BOTTLES DRAWN AEROBIC ONLY 3CC PT ON ROCEPHIN   Final     Culture  Setup Time 02/27/2012 21:42   Final    Culture     Final    Value:        BLOOD CULTURE RECEIVED NO GROWTH TO DATE CULTURE WILL BE HELD FOR 5 DAYS BEFORE ISSUING A FINAL NEGATIVE REPORT   Report Status PENDING   Incomplete   CULTURE, BLOOD (ROUTINE X 2)     Status: Normal (Preliminary result)   Collection Time   02/27/12  3:23 PM      Component Value Range Status Comment   Specimen Description BLOOD HAND RIGHT   Final    Special Requests BOTTLES DRAWN AEROBIC ONLY 10CC PT ON ROCEPHIN  Final    Culture  Setup Time 02/27/2012 21:42   Final    Culture     Final    Value:        BLOOD CULTURE RECEIVED NO GROWTH TO DATE CULTURE WILL BE HELD FOR 5 DAYS BEFORE ISSUING A FINAL NEGATIVE REPORT   Report Status PENDING   Incomplete   URINE CULTURE     Status: Normal   Collection Time   02/27/12  6:59 PM      Component Value Range Status Comment   Specimen Description URINE, RANDOM FROM LEFT LOWER POLE   Final    Special Requests NONE   Final    Culture  Setup Time 02/28/2012 02:17   Final    Colony Count >=100,000 COLONIES/ML   Final    Culture ENTEROBACTER CLOACAE   Final    Report Status 03/01/2012 FINAL   Final    Organism ID, Bacteria ENTEROBACTER CLOACAE   Final      Scheduled Meds:   . ALPRAZolam  0.25 mg Oral Daily  . cholecalciferol  1,000 Units Oral Daily  . citalopram  20 mg Oral Daily  . diltiazem  120 mg Oral Daily  . docusate sodium  100 mg Oral BID  . insulin aspart  0-15 Units Subcutaneous TID WC  . insulin glargine  24 Units Subcutaneous QHS  . multivitamin   1 tablet Oral Daily  . ZOSYN IV  3.375 g Intravenous Q8H  . pregabalin  75 mg Oral BID   Continuous Infusions:   . 0.45 % NaCl with KCl 20 mEq / L 100 mL/hr at 03/01/12 0250     Debbora Presto, MD  [facility] Pager [phone]  If 7PM-7AM, please contact night-coverage [email or web address] Password [email or web address] 03/01/2012, 12:25 PM   LOS: 3 days

## 2012-03-01 NOTE — Progress Notes (Signed)
ANTIBIOTIC CONSULT NOTE - INITIAL  Pharmacy Consult for Zosyn Indication: Left Pyelonephritis  Allergies  Allergen Reactions  . Morphine And Related     Patient Measurements: Height: 5\' 5"  (165.1 cm) Weight: 257 lb 8 oz (116.8 kg) IBW/kg (Calculated) : 57   Vital Signs: Temp: 98.4 F (36.9 C) (10/21 0505) Temp src: Oral (10/21 0505) BP: 150/88 mmHg (10/21 0505) Pulse Rate: 81  (10/21 0505) Intake/Output from previous day: 10/20 0701 - 10/21 0700 In: 3021.3 [P.O.:820; I.V.:1801.3; IV Piggyback:400] Out: 500 [Urine:500] Intake/Output from this shift:    Labs:  Basename 03/01/12 0535 02/29/12 0517 02/28/12 0550  WBC 7.2 8.9 7.4  HGB 10.0* 9.8* 9.6*  PLT 138* 115* 117*  LABCREA -- -- --  CREATININE 2.02* 1.96* 1.89*   Estimated Creatinine Clearance: 33.1 ml/min (by C-G formula based on Cr of 2.02). No results found for this basename: VANCOTROUGH:2,VANCOPEAK:2,VANCORANDOM:2,GENTTROUGH:2,GENTPEAK:2,GENTRANDOM:2,TOBRATROUGH:2,TOBRAPEAK:2,TOBRARND:2,AMIKACINPEAK:2,AMIKACINTROU:2,AMIKACIN:2, in the last 72 hours   Microbiology: Recent Results (from the past 720 hour(s))  URINE CULTURE     Status: Normal   Collection Time   01/31/12 11:50 PM      Component Value Range Status Comment   Specimen Description URINE, CATHETERIZED   Final    Special Requests NONE   Final    Culture  Setup Time 02/01/2012 11:38   Final    Colony Count NO GROWTH   Final    Culture NO GROWTH   Final    Report Status 02/02/2012 FINAL   Final   GRAM STAIN     Status: Normal   Collection Time   01/31/12 11:51 PM      Component Value Range Status Comment   Specimen Description URINE, CATHETERIZED   Final    Special Requests NONE   Final    Gram Stain     Final    Value: CYTOSPIN SLIDE     WBC PRESENT,BOTH PMN AND MONONUCLEAR     GRAM POSITIVE RODS     YEAST   Report Status 02/01/2012 FINAL   Final   MRSA PCR SCREENING     Status: Normal   Collection Time   02/01/12  5:40 AM      Component  Value Range Status Comment   MRSA by PCR NEGATIVE  NEGATIVE Final   CULTURE, BLOOD (ROUTINE X 2)     Status: Normal   Collection Time   02/01/12  8:25 AM      Component Value Range Status Comment   Specimen Description BLOOD HAND LEFT   Final    Special Requests BOTTLES DRAWN AEROBIC AND ANAEROBIC 5CC   Final    Culture  Setup Time 02/01/2012 13:11   Final    Culture NO GROWTH 5 DAYS   Final    Report Status 02/07/2012 FINAL   Final   CULTURE, BLOOD (ROUTINE X 2)     Status: Normal   Collection Time   02/01/12  8:30 AM      Component Value Range Status Comment   Specimen Description BLOOD HAND RIGHT   Final    Special Requests BOTTLES DRAWN AEROBIC ONLY 5CC   Final    Culture  Setup Time 02/01/2012 13:11   Final    Culture NO GROWTH 5 DAYS   Final    Report Status 02/07/2012 FINAL   Final   CLOSTRIDIUM DIFFICILE BY PCR     Status: Normal   Collection Time   02/03/12  3:31 PM      Component Value Range Status Comment  C difficile by pcr NEGATIVE  NEGATIVE Final   URINE CULTURE     Status: Normal   Collection Time   02/05/12  1:07 AM      Component Value Range Status Comment   Specimen Description URINE, RANDOM   Final    Special Requests NONE   Final    Culture  Setup Time 02/05/2012 09:22   Final    Colony Count NO GROWTH   Final    Culture NO GROWTH   Final    Report Status 02/06/2012 FINAL   Final   URINE CULTURE     Status: Normal   Collection Time   02/27/12 10:36 AM      Component Value Range Status Comment   Specimen Description URINE, CLEAN CATCH   Final    Special Requests NONE   Final    Culture  Setup Time 02/27/2012 11:46   Final    Colony Count >=100,000 COLONIES/ML   Final    Culture ENTEROBACTER CLOACAE   Final    Report Status 02/29/2012 FINAL   Final    Organism ID, Bacteria ENTEROBACTER CLOACAE   Final   URINE CULTURE     Status: Normal   Collection Time   02/27/12 11:43 AM      Component Value Range Status Comment   Specimen Description URINE,  CATHETERIZED   Final    Special Requests ADDED 02/27/12 1415   Final    Culture  Setup Time 02/27/2012 14:44   Final    Colony Count >=100,000 COLONIES/ML   Final    Culture ENTEROBACTER CLOACAE   Final    Report Status 02/29/2012 FINAL   Final    Organism ID, Bacteria ENTEROBACTER CLOACAE   Final   CULTURE, BLOOD (ROUTINE X 2)     Status: Normal (Preliminary result)   Collection Time   02/27/12  3:15 PM      Component Value Range Status Comment   Specimen Description BLOOD FOREARM RIGHT   Final    Special Requests BOTTLES DRAWN AEROBIC ONLY 3CC PT ON ROCEPHIN   Final    Culture  Setup Time 02/27/2012 21:42   Final    Culture     Final    Value:        BLOOD CULTURE RECEIVED NO GROWTH TO DATE CULTURE WILL BE HELD FOR 5 DAYS BEFORE ISSUING A FINAL NEGATIVE REPORT   Report Status PENDING   Incomplete   CULTURE, BLOOD (ROUTINE X 2)     Status: Normal (Preliminary result)   Collection Time   02/27/12  3:23 PM      Component Value Range Status Comment   Specimen Description BLOOD HAND RIGHT   Final    Special Requests BOTTLES DRAWN AEROBIC ONLY 10CC PT ON ROCEPHIN   Final    Culture  Setup Time 02/27/2012 21:42   Final    Culture     Final    Value:        BLOOD CULTURE RECEIVED NO GROWTH TO DATE CULTURE WILL BE HELD FOR 5 DAYS BEFORE ISSUING A FINAL NEGATIVE REPORT   Report Status PENDING   Incomplete   URINE CULTURE     Status: Normal (Preliminary result)   Collection Time   02/27/12  6:59 PM      Component Value Range Status Comment   Specimen Description URINE, RANDOM FROM LEFT LOWER POLE   Final    Special Requests NONE   Final    Culture  Setup Time 02/28/2012 02:17   Final    Colony Count >=100,000 COLONIES/ML   Final    Culture GRAM NEGATIVE RODS   Final    Report Status PENDING   Incomplete     Medical History: Past Medical History  Diagnosis Date  . Normal exercise sestamibi stress test 2007    [facility]  . Diabetes mellitus   . Hyperlipidemia   . Hypertension   . Heart  murmur   . Chronic kidney disease     CKD stage 3  . Depression   . Stroke 06/2011    [facility]   . Atrial fibrillation   . Pyelonephritis   . Ureteral stone with hydronephrosis   . Renal stone     Medications:  Anti-infectives     Start     Dose/Rate Route Frequency Ordered Stop   03/01/12 0900  piperacillin-tazobactam (ZOSYN) IVPB 3.375 g       3.375 g 12.5 mL/hr over 240 Minutes Intravenous 3 times per day 03/01/12 0805     02/27/12 2300   ampicillin-sulbactam (UNASYN) 1.5 g in sodium chloride 0.9 % 50 mL IVPB  Status:  Discontinued        1.5 g 100 mL/hr over 30 Minutes Intravenous Every 8 hours 02/27/12 2123 03/01/12 0757   02/27/12 2200   ciprofloxacin (CIPRO) IVPB 200 mg  Status:  Discontinued        200 mg 100 mL/hr over 60 Minutes Intravenous Every 12 hours 02/27/12 2123 03/01/12 0757   02/27/12 1415   Ampicillin-Sulbactam (UNASYN) 3 g in sodium chloride 0.9 % 100 mL IVPB        3 g 100 mL/hr over 60 Minutes Intravenous  Once 02/27/12 1409 02/27/12 1625   02/27/12 1330   cefTRIAXone (ROCEPHIN) 1 g in dextrose 5 % 50 mL IVPB        1 g 100 mL/hr over 30 Minutes Intravenous  Once 02/27/12 1317 02/27/12 1455         Assessment: 71 yo F s/p cyctoscopy w/ retrograde pyelogram/ureteral stent placement on 10/18. Now with Enterobacter cloacae sens only to Zosyn growing in urine Cx. Changing antibiotics from Unasyn + Cipro to Zosyn alone.  Plan:  1) Zosyn 3.375g IV q8h (4 hour infusion) 2) F/U 3rd Urine Cx currently growing GNRs  Darrol Angel, PharmD Pager: [phone] 03/01/2012,8:28 AM

## 2012-03-01 NOTE — Progress Notes (Signed)
PT Cancellation Note  Patient Details Name: Tina [name] MRN: [id number] DOB: 04-09-1941   Cancelled Treatment:    Reason Eval/Treat Not Completed: Other (comment) (PT ADMANTLY REFUSED)   [facility] 03/01/2012, 9:18 AM

## 2012-03-02 ENCOUNTER — Inpatient Hospital Stay (HOSPITAL_COMMUNITY): Payer: Medicare HMO

## 2012-03-02 LAB — BASIC METABOLIC PANEL
BUN: 34 mg/dL — ABNORMAL HIGH (ref 6–23)
CO2: 22 mEq/L (ref 19–32)
Chloride: 102 mEq/L (ref 96–112)
Creatinine, Ser: 2.22 mg/dL — ABNORMAL HIGH (ref 0.50–1.10)

## 2012-03-02 LAB — GLUCOSE, CAPILLARY
Glucose-Capillary: 94 mg/dL (ref 70–99)
Glucose-Capillary: 99 mg/dL (ref 70–99)

## 2012-03-02 LAB — CBC
HCT: 29.8 % — ABNORMAL LOW (ref 36.0–46.0)
MCH: 27.1 pg (ref 26.0–34.0)
MCV: 82.5 fL (ref 78.0–100.0)
RBC: 3.61 MIL/uL — ABNORMAL LOW (ref 3.87–5.11)
RDW: 14.9 % (ref 11.5–15.5)
WBC: 6.3 10*3/uL (ref 4.0–10.5)

## 2012-03-02 NOTE — Progress Notes (Signed)
Patient ID: Tina [name], female   DOB: 08-30-1940, 71 y.o.   MRN: [id number]  [facility] PROGRESS NOTE  Tina [name] NWG:[id number] DOB: 06-10-1940 DOA: 02/27/2012 PCP: Carney Living, MD  Brief narrative:  Pt admitted 10/18 under Dr. Annabell Howells care for further management of left obstructing ureteropelvic stone. [facility] was asked to follow along for management of medical problems. During this hospital stay pt has developed progressively worsening creatinine of unclear etiology. IVF were provided with no improvement and pt has developed mild pulmonary edema, per clinical exam finding and CXR findings. One dose of Lasix tried as well with no improvement.   Active Problems:  1. Hydronephrosis/ Left obstructing ureteropelvic stone with pyelonephritis - s/p cystoscopy with retrograde pyelogram and ureteral stent placement. Further management as per urology. Current antibiotic Unasyn changed to Zosyn as urine culture indicated sensitivity only to Zosyn. Pt still needs left ureteroscopy for definitive stone removal. This is planned for Thursday. 2. Hypertension: Improved and within target range. Continue to monitor vitals per floor protocol. 3. Acute on chronic renal failure - review of records indicate baseline Cr 1.6 - 1.7, creatinine is trending up. I have held IVF as CXR indicative of pulmonary edema, along with elevated BNP 2800 and bibasilar crackles and one dose of Lasix given. PT voided well with good urine output I will order renal US, urine sodium and urine creatinine, ANA, complement levels. If creatinine worsening persist will call nephrology for further assistance.  4. Fever - Tmax 99.1 F, unclear etiology, final urine culture indicative of enterococcal infection sensitive only to Zosyn, changed antibiotic to Zosyn.  5. Anemia of chronic disease - review of record indicate baseline Hg 9-10 and pt currently with Hg at baseline, CBC in AM 6. ? Vascular congestion per CXR 10/20  with elevated BNP and crackles on exam, oxygen saturations 90-92%, will hold off on IVF and one dose of Lasix already given.  7. Diabetes mellitus - continue SSI for now, CBG's have been stable 8. Thrombocytopenia - SCD for DVT prophylaxis  Procedures/Studies:  10/19/21013 - Dr. Annabell Howells  CYSTOSCOPY WITH RETROGRADE PYELOGRAM/URETERAL STENT PLACEMENT (Left)   Ct Abdomen Pelvis W Contrast  02/27/2012  MPRESSION:  1. Moderate left hydronephrosis and perinephric stranding due to 4 mm calculus at the left ureteral pelvic junction.  2. 6 mm nonobstructing calculus lower pole left kidney.  3. Cholelithiasis, without evidence of cholecystitis.   Dg Abd Acute W/chest  02/27/2012  IMPRESSION:  Moderate stool burden. No bowel obstruction or free air. Cardiomegaly, vascular congestion.   CXR  02/29/2012  IMPRESSION:  1. Findings compatible with pulmonary edema.  2. Bibasilar and perihilar opacities favored to represent atelectasis, though underlying infection not excluded. Continued attention on follow-up is recommended.   Antibiotics:  Unasyn 10/18 --> 10/21  Ciprofloxacin 10/18 --> 10/21  Zosyn 10/21 -->  Code Status: Full  Family Communication: Pt at bedside  Disposition Plan: Home when medically stable, per primary team, PT evaluation pending   HPI/Subjective: No events overnight.   Objective: Filed Vitals:   03/01/12 2238 03/02/12 0609 03/02/12 0900 03/02/12 1431  BP: 112/69 112/58 129/74 143/63  Pulse: 85 86 85 58  Temp: 99.1 F (37.3 C) 99.1 F (37.3 C) 98.4 F (36.9 C) 97.9 F (36.6 C)  TempSrc: Oral Oral Oral Oral  Resp: 18 18 18 20   Height:      Weight:      SpO2: 92% 90% 97% 94%    Intake/Output Summary (Last 24 hours) at 03/02/12 1657 Last  data filed at 03/02/12 1119  Gross per 24 hour  Intake    280 ml  Output      0 ml  Net    280 ml    Exam:   General:  Pt is alert, follows commands appropriately, not in acute distress  Cardiovascular: Regular rate  and rhythm, S1/S2, no murmurs, no rubs, no gallops  Respiratory: Clear to auscultation bilaterally, no wheezing, no crackles, no rhonchi  Abdomen: Soft, improved tenderness in lower quadrants, non distended, bowel sounds present, no guarding  Extremities: No edema, pulses DP and PT palpable bilaterally  Neuro: Grossly nonfocal  Data Reviewed: Basic Metabolic Panel:  Lab 03/02/12 1610 03/01/12 0535 02/29/12 0517 02/28/12 0550 02/27/12 1002  NA 137 135 135 135 139  K 4.3 5.0 4.7 4.9 4.7  CL 102 103 104 103 104  CO2 22 20 20 23 19   GLUCOSE 114* 119* 115* 203* 179*  BUN 34* 33* 29* 28* 31*  CREATININE 2.22* 2.02* 1.96* 1.89* 1.69*  CALCIUM 8.4 8.2* 7.8* 7.8* 9.0  MG -- -- -- -- --  PHOS -- -- -- -- --   Liver Function Tests:  Lab 02/27/12 1002  AST 15  ALT 9  ALKPHOS 118*  BILITOT 0.5  PROT 7.4  ALBUMIN 3.2*   CBC:  Lab 03/02/12 0446 03/01/12 0535 02/29/12 0517 02/28/12 0550 02/27/12 1002  WBC 6.3 7.2 8.9 7.4 10.1  NEUTROABS -- -- -- -- 8.0*  HGB 9.8* 10.0* 9.8* 9.6* 12.3  HCT 29.8* 30.8* 29.8* 29.4* 37.0  MCV 82.5 84.8 84.2 84.0 84.5  PLT 160 138* 115* 117* 142*   Cardiac Enzymes:  Lab 02/27/12 1002  CKTOTAL --  CKMB --  CKMBINDEX --  TROPONINI <0.30   CBG:  Lab 03/02/12 1159 03/02/12 0738 03/01/12 2312 03/01/12 1652 03/01/12 1151  GLUCAP 99 94 119* 100* 86    Recent Results (from the past 240 hour(s))  URINE CULTURE     Status: Normal   Collection Time   02/27/12 10:36 AM      Component Value Range Status Comment   Specimen Description URINE, CLEAN CATCH   Final    Special Requests NONE   Final    Culture  Setup Time 02/27/2012 11:46   Final    Colony Count >=100,000 COLONIES/ML   Final    Culture ENTEROBACTER CLOACAE   Final    Report Status 02/29/2012 FINAL   Final    Organism ID, Bacteria ENTEROBACTER CLOACAE   Final   URINE CULTURE     Status: Normal   Collection Time   02/27/12 11:43 AM      Component Value Range Status Comment   Specimen  Description URINE, CATHETERIZED   Final    Special Requests ADDED 02/27/12 1415   Final    Culture  Setup Time 02/27/2012 14:44   Final    Colony Count >=100,000 COLONIES/ML   Final    Culture ENTEROBACTER CLOACAE   Final    Report Status 02/29/2012 FINAL   Final    Organism ID, Bacteria ENTEROBACTER CLOACAE   Final   CULTURE, BLOOD (ROUTINE X 2)     Status: Normal (Preliminary result)   Collection Time   02/27/12  3:15 PM      Component Value Range Status Comment   Specimen Description BLOOD FOREARM RIGHT   Final    Special Requests BOTTLES DRAWN AEROBIC ONLY 3CC PT ON ROCEPHIN   Final    Culture  Setup Time 02/27/2012  21:42   Final    Culture     Final    Value:        BLOOD CULTURE RECEIVED NO GROWTH TO DATE CULTURE WILL BE HELD FOR 5 DAYS BEFORE ISSUING A FINAL NEGATIVE REPORT   Report Status PENDING   Incomplete   CULTURE, BLOOD (ROUTINE X 2)     Status: Normal (Preliminary result)   Collection Time   02/27/12  3:23 PM      Component Value Range Status Comment   Specimen Description BLOOD HAND RIGHT   Final    Special Requests BOTTLES DRAWN AEROBIC ONLY 10CC PT ON ROCEPHIN   Final    Culture  Setup Time 02/27/2012 21:42   Final    Culture     Final    Value:        BLOOD CULTURE RECEIVED NO GROWTH TO DATE CULTURE WILL BE HELD FOR 5 DAYS BEFORE ISSUING A FINAL NEGATIVE REPORT   Report Status PENDING   Incomplete   URINE CULTURE     Status: Normal   Collection Time   02/27/12  6:59 PM      Component Value Range Status Comment   Specimen Description URINE, RANDOM FROM LEFT LOWER POLE   Final    Special Requests NONE   Final    Culture  Setup Time 02/28/2012 02:17   Final    Colony Count >=100,000 COLONIES/ML   Final    Culture ENTEROBACTER CLOACAE   Final    Report Status 03/01/2012 FINAL   Final    Organism ID, Bacteria ENTEROBACTER CLOACAE   Final      Scheduled Meds:   . ALPRAZolam  0.25 mg Oral Daily  . cholecalciferol  1,000 Units Oral Daily  . citalopram  20 mg  Oral Daily  . diltiazem  120 mg Oral Daily  . docusate sodium  100 mg Oral BID  . insulin aspart  0-15 Units Subcutaneous TID WC  . insulin glargine  24 Units Subcutaneous QHS  . multivitamin with minerals  1 tablet Oral Daily  . piperacillin-tazobactam (ZOSYN)  IV  3.375 g Intravenous Q8H  . pregabalin  75 mg Oral BID   Continuous Infusions:    Tina Presto, MD  [facility] Pager [phone]  If 7PM-7AM, please contact night-coverage [email or web address] Password [email or web address] 03/02/2012, 4:57 PM   LOS: 4 days

## 2012-03-02 NOTE — Care Management Note (Signed)
    Page 1 of 2   03/11/2012     4:18:29 PM   CARE MANAGEMENT NOTE 03/11/2012  Patient:  Tina [name], Tina [name]   Account Number:  [id number]  Date Initiated:  03/02/2012  Documentation initiated by:  Lanier Clam  Subjective/Objective Assessment:   ADMITTED W/CHRONIC CYSTITIS.     Action/Plan:   FROM HOME.REHAB FACILITY IN PAST.   Anticipated DC Date:  03/10/2012   Anticipated DC Plan:  SKILLED NURSING FACILITY      DC Planning Services  CM consult      Choice offered to / List presented to:  C-1 Patient           Status of service:  Completed, signed off Medicare Important Message given?   (If response is "NO", the following Medicare IM given date fields will be blank) Date Medicare IM given:   Date Additional Medicare IM given:    Discharge Disposition:  SKILLED NURSING FACILITY  Per UR Regulation:  Reviewed for med. necessity/level of care/duration of stay  If discussed at Long Length of Stay Meetings, dates discussed:   03/04/2012    Comments:  03/08/12 Onya Eutsler RN,BSN NCM [phone] POD#4 URETEROSCOPY,AFLUTTER-CARDIZEM GTT.ENTERBACTER INFXN-ZOSYN IV.PT-SNF.D/C PLAN SNF WHEN MED STABLE.  03/05/12 Merilynn Haydu RN,BSN NCM [phone] CARDIO-AFLUTTER.POD#1 CYSTOSCOPY.IV ABX.D/C PLAN SNF.  03/04/12 Dushawn Pusey RN,BSN NCM [phone] P/O CYSTOSCOPY.NOTED PATIENT NOW AGREEABLE TO SNF.  03/02/12 Ky Moskowitz RN,BSN NCM [phone] UROLOGY FOLLOWING-POD#4-URETERAL STENT FOR URETEROSCOPY THURS.NO [facility]-SELECT BENEFIT.NOTED HER REFUSAL FOR PT TO WORK W/HER.PATIENT STATES SHE DOESN'T WANT TO GO TO A SNF CURRENTLY.PROVIDED [facility] AGENCY LIST.

## 2012-03-02 NOTE — Progress Notes (Signed)
POA papers to pt's chart naming Carla Drape, pt's niece as healthcare POA. dph

## 2012-03-02 NOTE — Progress Notes (Signed)
MD paged to question if patient is appropriate for psych eval. dph

## 2012-03-02 NOTE — Progress Notes (Signed)
Patient ID: Tina [name], female   DOB: November 08, 1940, 71 y.o.   MRN: [id number] 4 Days Post-Op  Subjective: Tina [name] is feeling better today and has been out of bed.  She doesn't have as much leg pain or abdominal pain.  Her Cr has continued to climb and is 2.22.  ROS: Negative except as above.  She denies fever or nausea.  Objective: Vital signs in last 24 hours: Temp:  [99.1 F (37.3 C)] 99.1 F (37.3 C) (10/22 0609) Pulse Rate:  [81-86] 86  (10/22 0609) Resp:  [18] 18  (10/22 0609) BP: (112-126)/(58-69) 112/58 mmHg (10/22 0609) SpO2:  [90 %-92 %] 90 % (10/22 0609)  Intake/Output from previous day: 10/21 0701 - 10/22 0700 In: 1130 [P.O.:180; I.V.:800; IV Piggyback:150] Out: -  Intake/Output this shift:    General appearance: alert and no distress Resp: clear to auscultation bilaterally Cardio: irregularly irregular rhythm GI: soft with +BS.  She still has some RLQ tenderness but no LUQ tenderness.   Lab Results:   [facility] 03/02/12 0446 03/01/12 0535  WBC 6.3 7.2  HGB 9.8* 10.0*  HCT 29.8* 30.8*  PLT 160 138*   BMET  Basename 03/02/12 0446 03/01/12 0535  NA 137 135  K 4.3 5.0  CL 102 103  CO2 22 20  GLUCOSE 114* 119*  BUN 34* 33*  CREATININE 2.22* 2.02*  CALCIUM 8.4 8.2*   PT/INR No results found for this basename: LABPROT:2,INR:2 in the last 72 hours ABG No results found for this basename: PHART:2,PCO2:2,PO2:2,HCO3:2 in the last 72 hours  Studies/Results: Dg Chest Port 1 View  02/29/2012  *RADIOLOGY REPORT*  Clinical Data: CHF, shortness of breath, weakness  PORTABLE CHEST - 1 VIEW  Comparison: 02/01/2012; 07/06/2011  Findings:  Grossly unchanged enlarged cardiac silhouette and mediastinal contours.  Pulmonary vasculature is indistinct with cephalization of flow.  Grossly unchanged perihilar and bibasilar heterogeneous opacities.  No definite pleural effusion or pneumothorax. Unchanged bones.  IMPRESSION: 1.  Findings compatible with pulmonary  edema.  2.  Bibasilar and perihilar opacities favored to represent atelectasis, though underlying infection not excluded.  Continued attention on follow-up is recommended.   Original Report Authenticated By: Waynard Reeds, M.D.     Anti-infectives: Anti-infectives     Start     Dose/Rate Route Frequency Ordered Stop   03/01/12 0900  piperacillin-tazobactam (ZOSYN) IVPB 3.375 g       3.375 g 12.5 mL/hr over 240 Minutes Intravenous 3 times per day 03/01/12 0805     02/27/12 2300   ampicillin-sulbactam (UNASYN) 1.5 g in sodium chloride 0.9 % 50 mL IVPB  Status:  Discontinued        1.5 g 100 mL/hr over 30 Minutes Intravenous Every 8 hours 02/27/12 2123 03/01/12 0757   02/27/12 2200   ciprofloxacin (CIPRO) IVPB 200 mg  Status:  Discontinued        200 mg 100 mL/hr over 60 Minutes Intravenous Every 12 hours 02/27/12 2123 03/01/12 0757   02/27/12 1415   Ampicillin-Sulbactam (UNASYN) 3 g in sodium chloride 0.9 % 100 mL IVPB        3 g 100 mL/hr over 60 Minutes Intravenous  Once 02/27/12 1409 02/27/12 1625   02/27/12 1330   cefTRIAXone (ROCEPHIN) 1 g in dextrose 5 % 50 mL IVPB        1 g 100 mL/hr over 30 Minutes Intravenous  Once 02/27/12 1317 02/27/12 1455          Current Facility-Administered Medications  Medication Dose Route Frequency Provider Last Rate Last Dose  . acetaminophen (TYLENOL) tablet 650 mg  650 mg Oral Q4H PRN Anner Crete, MD   650 mg at 02/29/12 2010  . ALPRAZolam Prudy Feeler) tablet 0.25 mg  0.25 mg Oral Daily Anner Crete, MD   0.25 mg at 03/01/12 1040  . bisacodyl (DULCOLAX) suppository 10 mg  10 mg Rectal Daily PRN Anner Crete, MD      . cholecalciferol (VITAMIN D) tablet 1,000 Units  1,000 Units Oral Daily Anner Crete, MD   1,000 Units at 03/01/12 1040  . citalopram (CELEXA) tablet 20 mg  20 mg Oral Daily Anner Crete, MD   20 mg at 03/01/12 1040  . diclofenac sodium (VOLTAREN) 1 % transdermal gel 2 g  2 g Topical QID PRN Anner Crete, MD      . diltiazem  [facility]) 24 hr capsule 120 mg  120 mg Oral Daily Anner Crete, MD   120 mg at 03/01/12 1040  . docusate sodium (COLACE) capsule 100 mg  100 mg Oral BID Anner Crete, MD   100 mg at 02/29/12 1058  . fentaNYL (SUBLIMAZE) injection 25-50 mcg  25-50 mcg Intravenous Q1H PRN Anner Crete, MD      . furosemide (LASIX) injection 40 mg  40 mg Intravenous Once Dorothea Ogle, MD   40 mg at 03/01/12 1536  . hyoscyamine (LEVSIN SL) SL tablet 0.125 mg  0.125 mg Oral Q4H PRN Anner Crete, MD   0.125 mg at 02/28/12 2203  . insulin aspart (novoLOG) injection 0-15 Units  0-15 Units Subcutaneous TID WC Anner Crete, MD   2 Units at 03/01/12 [phone]  . insulin glargine (LANTUS) injection 24 Units  24 Units Subcutaneous QHS Kathlen Mody, MD   24 Units at 03/01/12 2311  . meclizine (ANTIVERT) tablet 12.5 mg  12.5 mg Oral TID PRN Anner Crete, MD      . multivitamin with minerals tablet 1 tablet  1 tablet Oral Daily Anner Crete, MD   1 tablet at 03/01/12 1040  . ondansetron (ZOFRAN) injection 4 mg  4 mg Intravenous Q4H PRN Anner Crete, MD      . piperacillin-tazobactam (ZOSYN) IVPB 3.375 g  3.375 g Intravenous Q8H Annia Belt, PHARMD   3.375 g at 03/02/12 0606  . pregabalin (LYRICA) capsule 75 mg  75 mg Oral BID Anner Crete, MD   75 mg at 03/01/12 2302  . traMADol (ULTRAM) tablet 50 mg  50 mg Oral Q6H PRN Anner Crete, MD   50 mg at 03/01/12 1536  . zolpidem (AMBIEN) tablet 5 mg  5 mg Oral QHS PRN Anner Crete, MD   5 mg at 02/29/12 2013  . DISCONTD: 0.45 % NaCl with KCl 20 mEq / L infusion   Intravenous Continuous Anner Crete, MD 100 mL/hr at 03/01/12 0250    . DISCONTD: furosemide (LASIX) injection 20 mg  20 mg Intravenous Once Dorothea Ogle, MD        Assessment: s/p Procedure(s): CYSTOSCOPY WITH RETROGRADE PYELOGRAM/URETERAL STENT PLACEMENT  She is feeling better but has some progressive renal insufficiency.  Plan: Continue the Zosyn. She is for ureteroscopy on Thursday.  Will defer further  assessment and management of the ARI to hospitalist service vis a vis need for nephrology consultation at this point.    LOS: 4 days    Alexarae Oliva J 03/02/2012

## 2012-03-02 NOTE — Evaluation (Signed)
Physical Therapy Evaluation Patient Details Name: Tina [name] MRN: [id number] DOB: [birth year] Today's Date: 03/02/2012 Time: 4098-1191 PT Time Calculation (min): 40 min  PT Assessment / Plan / Recommendation Clinical Impression  Pt presents with chronic cystitis s/o urethral stent placement and also with history of abfib, TIA and memory loss.  Tolerated OOB to 3in1 then took some steps from 3in1 to recliner, however requires +2 at this time for safety with max cues for safety and technique.  Pt is adamant about returning home, however provided max education about benefits of going to SNF for short term rehab stay.  Pt will benefit from skilled PT in acute venue to address deficits.  PT recommends ST SNF, however pt refuses, therefore recommend HHPT at D/C.     PT Assessment  Patient needs continued PT services    Follow Up Recommendations  Supervision/Assistance - 24 hour;Post acute inpatient;Home health PT    Does the patient have the potential to tolerate intense rehabilitation   No, Recommend SNF  Barriers to Discharge Decreased caregiver support      Equipment Recommendations  None recommended by PT    Recommendations for Other Services OT consult   Frequency Min 3X/week    Precautions / Restrictions Precautions Precautions: Fall Restrictions Weight Bearing Restrictions: No   Pertinent Vitals/Pain 5/10 pain in abdomen      Mobility  Bed Mobility Bed Mobility: Supine to Sit;Sitting - Scoot to Edge of Bed Supine to Sit: 1: +2 Total assist;HOB elevated;With rails Supine to Sit: Patient Percentage: 50% Sitting - Scoot to Edge of Bed: 3: Mod assist Details for Bed Mobility Assistance: Assist for trunk to attain upright sitting position with some assist for LEs off of bed.  Requires max cues for hand placement, technique and attending to task throughout session.  Transfers Transfers: Sit to Stand;Stand to Sit Sit to Stand: 1: +2 Total assist;From elevated  surface;With upper extremity assist;From bed Sit to Stand: Patient Percentage: 50% Stand to Sit: 1: +2 Total assist;With upper extremity assist;With armrests;To chair/3-in-1 Stand to Sit: Patient Percentage: 60% Stand Pivot Transfers: 1: +2 Total assist Stand Pivot Transfers: Patient Percentage: 50% Details for Transfer Assistance: Assist to rise and steady with cues for hand placement, safety and technique when sitting/standing.  Pt attempted to sit abruptly due to feeling light headed.  Max education and cues for safety.  Ambulation/Gait Ambulation/Gait Assistance: 1: +2 Total assist Ambulation/Gait: Patient Percentage: 50% Ambulation Distance (Feet): 5 Feet Assistive device: Rolling walker Ambulation/Gait Assistance Details: Pt was able to take some steps from 3in1 to recliner, however requires +2 for safety with max cues for encouragement, safety, sequencing/technique with RW and to maintain upright posture.  Gait Pattern: Step-to pattern;Decreased stride length;Trunk flexed Stairs: No Wheelchair Mobility Wheelchair Mobility: No    Shoulder Instructions     Exercises     PT Diagnosis: Difficulty walking;Generalized weakness;Acute pain  PT Problem List: Decreased strength;Decreased activity tolerance;Decreased balance;Decreased mobility;Decreased cognition;Decreased knowledge of use of DME;Decreased safety awareness;Decreased knowledge of precautions;Obesity;Pain PT Treatment Interventions: DME instruction;Gait training;Stair training;Functional mobility training;Therapeutic activities;Therapeutic exercise;Balance training;Patient/family education   PT Goals Acute Rehab PT Goals PT Goal Formulation: With patient Time For Goal Achievement: 03/09/12 Potential to Achieve Goals: Good Pt will go Supine/Side to Sit: with modified independence PT Goal: Supine/Side to Sit - Progress: Goal set today Pt will go Sit to Stand: with modified independence PT Goal: Sit to Stand - Progress: Goal  set today Pt will Transfer Bed to Chair/Chair to Bed: with modified independence  PT Transfer Goal: Bed to Chair/Chair to Bed - Progress: Goal set today Pt will Ambulate: 16 - 50 feet;with modified independence;with least restrictive assistive device PT Goal: Ambulate - Progress: Goal set today Pt will Go Up / Down Stairs: 3-5 stairs;with rail(s);with supervision;with least restrictive assistive device PT Goal: Up/Down Stairs - Progress: Goal set today  Visit Information  Last PT Received On: 03/02/12 Assistance Needed: +2    Subjective Data  Subjective: My stomach always hurts and I think Ive messed myself.  Patient Stated Goal: to go home, not rehab   Prior Functioning  Home Living Lives With: Son Available Help at Discharge: Available PRN/intermittently Type of Home: Mobile home Home Access: Stairs to enter Entrance Stairs-Number of Steps: 5 Entrance Stairs-Rails: Can reach both;Right;Left Home Layout: One level Bathroom Shower/Tub: Engineer, manufacturing systems: Standard Home Adaptive Equipment: Walker - rolling;Shower chair with back Prior Function Level of Independence: Independent with assistive device(s) Able to Take Stairs?: Yes Driving: No Vocation: Retired Musician: No difficulties    Cognition  Overall Cognitive Status: Impaired Area of Impairment: Following commands;Safety/judgement;Awareness of errors;Awareness of deficits;Problem solving Arousal/Alertness: Awake/alert Orientation Level: Appears intact for tasks assessed Behavior During Session: [facility] for tasks performed Following Commands: Follows one step commands with increased time Safety/Judgement: Decreased awareness of safety precautions;Decreased safety judgement for tasks assessed;Decreased awareness of need for assistance Awareness of Errors: Assistance required to identify errors made;Assistance required to correct errors made Awareness of Deficits: Pt is adamant about returning  home, however would greatly benefit from rehab stay to increase strength and safety.     Extremity/Trunk Assessment Right Lower Extremity Assessment RLE ROM/Strength/Tone: Deficits RLE ROM/Strength/Tone Deficits: Grossly 3/5 per functional assessment.  RLE Sensation: WFL - Light Touch Left Lower Extremity Assessment LLE ROM/Strength/Tone: Deficits LLE ROM/Strength/Tone Deficits: Grossly 3/5 per functional assessment.  LLE Sensation: WFL - Light Touch Trunk Assessment Trunk Assessment: Normal   Balance    End of Session PT - End of Session Equipment Utilized During Treatment: Gait belt Activity Tolerance: Patient limited by fatigue Patient left: in chair;with call bell/phone within reach Nurse Communication: Mobility status  GP     Page, Meribeth Mattes 03/02/2012, 5:14 PM

## 2012-03-03 DIAGNOSIS — N1330 Unspecified hydronephrosis: Secondary | ICD-10-CM

## 2012-03-03 LAB — CBC
HCT: 27.5 % — ABNORMAL LOW (ref 36.0–46.0)
Hemoglobin: 9.1 g/dL — ABNORMAL LOW (ref 12.0–15.0)
MCH: 27.2 pg (ref 26.0–34.0)
MCHC: 33.1 g/dL (ref 30.0–36.0)
RDW: 15 % (ref 11.5–15.5)

## 2012-03-03 LAB — BASIC METABOLIC PANEL
BUN: 31 mg/dL — ABNORMAL HIGH (ref 6–23)
Calcium: 8.3 mg/dL — ABNORMAL LOW (ref 8.4–10.5)
GFR calc Af Amer: 25 mL/min — ABNORMAL LOW (ref >90)
GFR calc non Af Amer: 21 mL/min — ABNORMAL LOW (ref >90)
Glucose, Bld: 119 mg/dL — ABNORMAL HIGH (ref 70–99)
Potassium: 3.7 mEq/L (ref 3.5–5.1)

## 2012-03-03 LAB — GLUCOSE, CAPILLARY: Glucose-Capillary: 132 mg/dL — ABNORMAL HIGH (ref 70–99)

## 2012-03-03 LAB — STONE ANALYSIS: Stone Weight KSTONE: 0.021 g

## 2012-03-03 LAB — C3 COMPLEMENT: C3 Complement: 177 mg/dL (ref 90–180)

## 2012-03-03 MED ORDER — ENSURE COMPLETE PO LIQD
237.0000 mL | Freq: Two times a day (BID) | ORAL | Status: DC
  Administered 2012-03-05 – 2012-03-10 (×12): 237 mL via ORAL

## 2012-03-03 NOTE — Progress Notes (Signed)
Patient ID: Tina [name], female   DOB: 1941/03/27, 71 y.o.   MRN: [id number] 5 Days Post-Op  Subjective: Beverely has no new complaints.  Her Cr is down slightly to 2.2 and a renal US showed no hydro.  She is on for ureteroscopy tomorrow for her left ureteral and renal stones.  She remains afebrile on Zosyn for enterobacter.  ROS: Negative except as above.   Objective: Vital signs in last 24 hours: Temp:  [97.9 F (36.6 C)-98.4 F (36.9 C)] 97.9 F (36.6 C) (10/23 4098) Pulse Rate:  [58-93] 93  (10/23 0613) Resp:  [18-22] 22  (10/23 0613) BP: (129-143)/(63-80) 136/80 mmHg (10/23 0613) SpO2:  [94 %-97 %] 94 % (10/23 1191)  Intake/Output from previous day: 10/22 0701 - 10/23 0700 In: 460 [P.O.:360; IV Piggyback:100] Out: -  Intake/Output this shift:    General appearance: alert and no distress Resp: clear to auscultation bilaterally Cardio: irregularly irregular rhythm GI: Soft with +BS.  She has persistent mild RLQ tenderness but no left tenderness.  No masses are noted.   Lab Results:   Basename 03/03/12 0445 03/02/12 0446  WBC 7.6 6.3  HGB 9.1* 9.8*  HCT 27.5* 29.8*  PLT 174 160   BMET  Basename 03/03/12 0445 03/02/12 0446  NA 138 137  K 3.7 4.3  CL 104 102  CO2 23 22  GLUCOSE 119* 114*  BUN 31* 34*  CREATININE 2.20* 2.22*  CALCIUM 8.3* 8.4   PT/INR No results found for this basename: LABPROT:2,INR:2 in the last 72 hours ABG No results found for this basename: PHART:2,PCO2:2,PO2:2,HCO3:2 in the last 72 hours  Studies/Results: US Renal  03/02/2012  *RADIOLOGY REPORT*  Clinical Data: Renal failure.  RENAL/URINARY TRACT ULTRASOUND COMPLETE  Comparison:  Abdominal CT 02/27/2012  Findings:  Right Kidney:  Right kidney measures 9.5 cm in length.  The lower pole is partially obscured by bowel gas.  No evidence for right hydronephrosis. There is a slightly hypoechoic structure near the renal hilum that measures up to 1.7 cm.  This most likely represents a  parapelvic cyst based on the previous CT study.  Left Kidney:  Left kidney measures 11.2 cm in length without hydronephrosis.  Hypoechoic structure in the mid pole measures up to 1.4 cm and probably represents a cystic structure.  There is a 5 mm echogenic structure with acoustic shadowing in the lower pole. Findings are consistent with a stone.  There is also mildly hypoechoic structure in the lower pole that measures up to 1.9 cm.  Bladder: Small amount of fluid in the urinary bladder.  There appears to be a ureteral stent within the bladder.  There is concern for posterior bladder wall thickening or debris.  IMPRESSION: No evidence for hydronephrosis.  There are heterogeneous hypoechoic structures in both kidneys that most likely represent parapelvic cysts.  5 mm left kidney stone.  Debris or posterior wall thickening in the bladder.   Original Report Authenticated By: Richarda Overlie, M.D.     Anti-infectives: Anti-infectives     Start     Dose/Rate Route Frequency Ordered Stop   03/01/12 0900  piperacillin-tazobactam (ZOSYN) IVPB 3.375 g       3.375 g 12.5 mL/hr over 240 Minutes Intravenous 3 times per day 03/01/12 0805     02/27/12 2300   ampicillin-sulbactam (UNASYN) 1.5 g in sodium chloride 0.9 % 50 mL IVPB  Status:  Discontinued        1.5 g 100 mL/hr over 30 Minutes Intravenous Every 8 hours  02/27/12 2123 03/01/12 0757   02/27/12 2200   ciprofloxacin (CIPRO) IVPB 200 mg  Status:  Discontinued        200 mg 100 mL/hr over 60 Minutes Intravenous Every 12 hours 02/27/12 2123 03/01/12 0757   02/27/12 1415   Ampicillin-Sulbactam (UNASYN) 3 g in sodium chloride 0.9 % 100 mL IVPB        3 g 100 mL/hr over 60 Minutes Intravenous  Once 02/27/12 1409 02/27/12 1625   02/27/12 1330   cefTRIAXone (ROCEPHIN) 1 g in dextrose 5 % 50 mL IVPB        1 g 100 mL/hr over 30 Minutes Intravenous  Once 02/27/12 1317 02/27/12 1455          Current Facility-Administered Medications  Medication Dose Route  Frequency Provider Last Rate Last Dose  . acetaminophen (TYLENOL) tablet 650 mg  650 mg Oral Q4H PRN Anner Crete, MD   650 mg at 02/29/12 2010  . ALPRAZolam Prudy Feeler) tablet 0.25 mg  0.25 mg Oral Daily Anner Crete, MD   0.25 mg at 03/02/12 1041  . bisacodyl (DULCOLAX) suppository 10 mg  10 mg Rectal Daily PRN Anner Crete, MD      . cholecalciferol (VITAMIN D) tablet 1,000 Units  1,000 Units Oral Daily Anner Crete, MD   1,000 Units at 03/02/12 1042  . citalopram (CELEXA) tablet 20 mg  20 mg Oral Daily Anner Crete, MD   20 mg at 03/02/12 1042  . diclofenac sodium (VOLTAREN) 1 % transdermal gel 2 g  2 g Topical QID PRN Anner Crete, MD      . diltiazem [facility]) 24 hr capsule 120 mg  120 mg Oral Daily Anner Crete, MD   120 mg at 03/02/12 1042  . docusate sodium (COLACE) capsule 100 mg  100 mg Oral BID Anner Crete, MD   100 mg at 02/29/12 1058  . fentaNYL (SUBLIMAZE) injection 25-50 mcg  25-50 mcg Intravenous Q1H PRN Anner Crete, MD      . hyoscyamine (LEVSIN SL) SL tablet 0.125 mg  0.125 mg Oral Q4H PRN Anner Crete, MD   0.125 mg at 02/28/12 2203  . insulin aspart (novoLOG) injection 0-15 Units  0-15 Units Subcutaneous TID WC Anner Crete, MD   8 Units at 03/02/12 1725  . insulin glargine (LANTUS) injection 24 Units  24 Units Subcutaneous QHS Kathlen Mody, MD   24 Units at 03/02/12 2132  . meclizine (ANTIVERT) tablet 12.5 mg  12.5 mg Oral TID PRN Anner Crete, MD      . multivitamin with minerals tablet 1 tablet  1 tablet Oral Daily Anner Crete, MD   1 tablet at 03/02/12 1042  . ondansetron (ZOFRAN) injection 4 mg  4 mg Intravenous Q4H PRN Anner Crete, MD      . piperacillin-tazobactam (ZOSYN) IVPB 3.375 g  3.375 g Intravenous Q8H Annia Belt, PHARMD   3.375 g at 03/03/12 5784  . pregabalin (LYRICA) capsule 75 mg  75 mg Oral BID Anner Crete, MD   75 mg at 03/02/12 2133  . traMADol (ULTRAM) tablet 50 mg  50 mg Oral Q6H PRN Anner Crete, MD   50 mg at 03/02/12 2141  . zolpidem  (AMBIEN) tablet 5 mg  5 mg Oral QHS PRN Anner Crete, MD   5 mg at 03/02/12 2141    Assessment: s/p Procedure(s): CYSTOSCOPY WITH RETROGRADE PYELOGRAM/URETERAL STENT PLACEMENT  Her  renal US showed no obstruction and her Cr is starting to decline.   Plan: She is for left ureteroscopy for the ureteral and renal stones tomorrow.  Risks reviewed with patient.    LOS: 5 days    Bryne Lindon J 03/03/2012

## 2012-03-03 NOTE — Progress Notes (Signed)
Physical Therapy Treatment Patient Details Name: Aleene Swanner MRN: [id number] DOB: 06/30/1940 Today's Date: 03/03/2012 Time: 0823-0903 PT Time Calculation (min): 40 min  PT Assessment / Plan / Recommendation Comments on Treatment Session  Pt progressing with mobility, however requires +2 for safety at this point in time.  Continue to recommend ST SNF, however, pt refusing at this time.     Follow Up Recommendations  Supervision/Assistance - 24 hour;Post acute inpatient;Home health PT     Does the patient have the potential to tolerate intense rehabilitation  No, Recommend SNF  Barriers to Discharge        Equipment Recommendations  None recommended by PT    Recommendations for Other Services OT consult  Frequency Min 3X/week   Plan Discharge plan remains appropriate;Frequency remains appropriate    Precautions / Restrictions Precautions Precautions: Fall Restrictions Weight Bearing Restrictions: Yes LLE Weight Bearing: Weight bearing as tolerated   Pertinent Vitals/Pain Pain in abdomen    Mobility  Bed Mobility Bed Mobility: Supine to Sit;Sitting - Scoot to Edge of Bed Supine to Sit: 1: +2 Total assist;HOB elevated;With rails Supine to Sit: Patient Percentage: 60% Sitting - Scoot to Edge of Bed: 4: Min assist Details for Bed Mobility Assistance: Continues to require some assist for trunk, however presents with improved bed mobility today.  Requires max cuing for hand placement, technique, safety and attending to task.  Transfers Transfers: Sit to Stand;Stand to Sit Sit to Stand: 1: +2 Total assist;From elevated surface;With upper extremity assist;From bed Sit to Stand: Patient Percentage: 60% Stand to Sit: 1: +2 Total assist;With upper extremity assist;With armrests;To chair/3-in-1 Stand to Sit: Patient Percentage: 70% Stand Pivot Transfers: 1: +2 Total assist Stand Pivot Transfers: Patient Percentage: 60% Details for Transfer Assistance: Assist to rise and  steady with cues for hand placement, safety and technique.  Again, noted improved transfers during todays session.  Ambulation/Gait Ambulation/Gait Assistance: 1: +2 Total assist Ambulation/Gait: Patient Percentage: 60% Ambulation Distance (Feet): 20 Feet Assistive device: Rolling walker Ambulation/Gait Assistance Details: Cues for sequencing/technique with  RW, maintaining upright posture and safety with ambulation.  Gait Pattern: Step-to pattern;Decreased stride length;Trunk flexed    Exercises     PT Diagnosis:    PT Problem List:   PT Treatment Interventions:     PT Goals Acute Rehab PT Goals PT Goal Formulation: With patient Time For Goal Achievement: 03/09/12 Potential to Achieve Goals: Good Pt will go Supine/Side to Sit: with modified independence PT Goal: Supine/Side to Sit - Progress: Progressing toward goal Pt will go Sit to Stand: with modified independence PT Goal: Sit to Stand - Progress: Progressing toward goal Pt will Transfer Bed to Chair/Chair to Bed: with modified independence PT Transfer Goal: Bed to Chair/Chair to Bed - Progress: Progressing toward goal Pt will Ambulate: 16 - 50 feet;with modified independence;with least restrictive assistive device PT Goal: Ambulate - Progress: Progressing toward goal  Visit Information  Last PT Received On: 03/03/12 Assistance Needed: +2    Subjective Data  Subjective: I'm going to get that stent tomorrow.  I don't really understand it.  Patient Stated Goal: to go home, not rehab   Cognition  Overall Cognitive Status: Impaired Area of Impairment: Following commands;Safety/judgement;Awareness of errors;Awareness of deficits;Problem solving Arousal/Alertness: Awake/alert Orientation Level: Appears intact for tasks assessed Behavior During Session: [facility] for tasks performed Following Commands: Follows one step commands with increased time Safety/Judgement: Decreased awareness of safety precautions;Decreased safety  judgement for tasks assessed;Decreased awareness of need for assistance Awareness of Errors:  Assistance required to identify errors made;Assistance required to correct errors made Awareness of Deficits: Pt is adamant about returning home, however would greatly benefit from rehab stay to increase strength and safety.     Balance  Balance Balance Assessed: Yes Static Sitting Balance Static Sitting - Balance Support: No upper extremity supported;Feet supported Static Sitting - Level of Assistance: 5: Stand by assistance Static Sitting - Comment/# of Minutes: Had pt perform self care activity at EOB x approx 10 mins at stand by assist.   End of Session PT - End of Session Activity Tolerance: Patient limited by fatigue Patient left: in chair;with call bell/phone within reach Nurse Communication: Mobility status   GP     Page, Meribeth Mattes 03/03/2012, 10:13 AM

## 2012-03-03 NOTE — Progress Notes (Addendum)
[facility]             Progress Note   Subjective: Sleeping. Did not rouse to voice.  Objective: Vital signs in last 24 hours: Temp:  [97.9 F (36.6 C)-98.3 F (36.8 C)] 98.3 F (36.8 C) (10/23 1428) Pulse Rate:  [86-97] 86  (10/23 1428) Resp:  [20-22] 20  (10/23 1428) BP: (113-136)/(67-80) 123/67 mmHg (10/23 1428) SpO2:  [92 %-97 %] 92 % (10/23 1428) Weight change:  Last BM Date: 03/03/12  Intake/Output from previous day: 10/22 0701 - 10/23 0700 In: 460 [P.O.:360; IV Piggyback:100] Out: -  Total I/O In: 480 [P.O.:480] Out: -    Physical Exam: Deferred as patient sleeping.   Lab Results: Basic Metabolic Panel:  Basename 03/03/12 0445 03/02/12 0446  NA 138 137  K 3.7 4.3  CL 104 102  CO2 23 22  GLUCOSE 119* 114*  BUN 31* 34*  CREATININE 2.20* 2.22*  CALCIUM 8.3* 8.4  MG -- --  PHOS -- --   CBC:  Basename 03/03/12 0445 03/02/12 0446  WBC 7.6 6.3  NEUTROABS -- --  HGB 9.1* 9.8*  HCT 27.5* 29.8*  MCV 82.1 82.5  PLT 174 160   CBG:  Basename 03/03/12 0741 03/02/12 2121 03/02/12 1658 03/02/12 1159 03/02/12 0738 03/01/12 2312  GLUCAP 105* 198* 278* 99 94 119*    Recent Results (from the past 240 hour(s))  URINE CULTURE     Status: Normal   Collection Time   02/27/12 10:36 AM      Component Value Range Status Comment   Specimen Description URINE, CLEAN CATCH   Final    Special Requests NONE   Final    Culture  Setup Time 02/27/2012 11:46   Final    Colony Count >=100,000 COLONIES/ML   Final    Culture ENTEROBACTER CLOACAE   Final    Report Status 02/29/2012 FINAL   Final    Organism ID, Bacteria ENTEROBACTER CLOACAE   Final   URINE CULTURE     Status: Normal   Collection Time   02/27/12 11:43 AM      Component Value Range Status Comment   Specimen Description URINE, CATHETERIZED   Final    Special Requests ADDED 02/27/12 1415   Final    Culture  Setup Time 02/27/2012 14:44   Final    Colony Count >=100,000 COLONIES/ML   Final      Culture ENTEROBACTER CLOACAE   Final    Report Status 02/29/2012 FINAL   Final    Organism ID, Bacteria ENTEROBACTER CLOACAE   Final   CULTURE, BLOOD (ROUTINE X 2)     Status: Normal (Preliminary result)   Collection Time   02/27/12  3:15 PM      Component Value Range Status Comment   Specimen Description BLOOD FOREARM RIGHT   Final    Special Requests BOTTLES DRAWN AEROBIC ONLY 3CC PT ON ROCEPHIN   Final    Culture  Setup Time 02/27/2012 21:42   Final    Culture     Final    Value:        BLOOD CULTURE RECEIVED NO GROWTH TO DATE CULTURE WILL BE HELD FOR 5 DAYS BEFORE ISSUING A FINAL NEGATIVE REPORT   Report Status PENDING   Incomplete   CULTURE, BLOOD (ROUTINE X 2)     Status: Normal (Preliminary result)   Collection Time   02/27/12  3:23 PM      Component Value Range Status Comment  Specimen Description BLOOD HAND RIGHT   Final    Special Requests BOTTLES DRAWN AEROBIC ONLY 10CC PT ON ROCEPHIN   Final    Culture  Setup Time 02/27/2012 21:42   Final    Culture     Final    Value:        BLOOD CULTURE RECEIVED NO GROWTH TO DATE CULTURE WILL BE HELD FOR 5 DAYS BEFORE ISSUING A FINAL NEGATIVE REPORT   Report Status PENDING   Incomplete   URINE CULTURE     Status: Normal   Collection Time   02/27/12  6:59 PM      Component Value Range Status Comment   Specimen Description URINE, RANDOM FROM LEFT LOWER POLE   Final    Special Requests NONE   Final    Culture  Setup Time 02/28/2012 02:17   Final    Colony Count >=100,000 COLONIES/ML   Final    Culture ENTEROBACTER CLOACAE   Final    Report Status 03/01/2012 FINAL   Final    Organism ID, Bacteria ENTEROBACTER CLOACAE   Final     Studies/Results: US Renal  03/02/2012  *RADIOLOGY REPORT*  Clinical Data: Renal failure.  RENAL/URINARY TRACT ULTRASOUND COMPLETE  Comparison:  Abdominal CT 02/27/2012  Findings:  Right Kidney:  Right kidney measures 9.5 cm in length.  The lower pole is partially obscured by bowel gas.  No evidence for  right hydronephrosis. There is a slightly hypoechoic structure near the renal hilum that measures up to 1.7 cm.  This most likely represents a parapelvic cyst based on the previous CT study.  Left Kidney:  Left kidney measures 11.2 cm in length without hydronephrosis.  Hypoechoic structure in the mid pole measures up to 1.4 cm and probably represents a cystic structure.  There is a 5 mm echogenic structure with acoustic shadowing in the lower pole. Findings are consistent with a stone.  There is also mildly hypoechoic structure in the lower pole that measures up to 1.9 cm.  Bladder: Small amount of fluid in the urinary bladder.  There appears to be a ureteral stent within the bladder.  There is concern for posterior bladder wall thickening or debris.  IMPRESSION: No evidence for hydronephrosis.  There are heterogeneous hypoechoic structures in both kidneys that most likely represent parapelvic cysts.  5 mm left kidney stone.  Debris or posterior wall thickening in the bladder.   Original Report Authenticated By: Richarda Overlie, M.D.     Medications: Scheduled Meds:    . ALPRAZolam  0.25 mg Oral Daily  . cholecalciferol  1,000 Units Oral Daily  . citalopram  20 mg Oral Daily  . diltiazem  120 mg Oral Daily  . docusate sodium  100 mg Oral BID  . insulin aspart  0-15 Units Subcutaneous TID WC  . insulin glargine  24 Units Subcutaneous QHS  . multivitamin with minerals  1 tablet Oral Daily  . piperacillin-tazobactam (ZOSYN)  IV  3.375 g Intravenous Q8H  . pregabalin  75 mg Oral BID   Continuous Infusions:  PRN Meds:.acetaminophen, bisacodyl, diclofenac sodium, fentaNYL, hyoscyamine, meclizine, ondansetron, traMADol, zolpidem  Assessment/Plan:  Principal Problem:  *Ureteral stone with hydronephrosis Active Problems:  DM (diabetes mellitus) with complications  Pyelonephritis  Chronic cystitis  Hydronephrosis   Hydronephrosis/Left obstructing ureteropelvic stone with Pyelonephritis -For  ureteroscopy in am by Dr. Annabell Howells. -Most recent US shows no hydronephrosis. -Continue Zosyn.  Acute on CKD Stage III -Cr seems to have stabilized. -Suspect acute worsening 2/2 #  1. -Recheck BMET in am.  DM -Well controlled.  Mild Thrombocytopenia -Resolved.  AOCD -Hb stable. -No need for transfusion unless Hb <7.  Disposition -PT recs SNF. -Patient is refusing SNF placement. -Per RN report patient is oriented. -Was sleeping today. -Will eval for decision-making capacity in the am.   Time spent coordinating care: 20 minutes.   LOS: 5 days   [facility] Pager: [phone] 03/03/2012, 4:21 PM

## 2012-03-03 NOTE — Progress Notes (Signed)
INITIAL ADULT NUTRITION ASSESSMENT Date: 03/03/2012   Time: 1:51 PM Reason for Assessment: Consult for Assessment of Nutrition Requirements/Status  ASSESSMENT: Female 71 y.o.  Dx: chronic cystitis, hydronephrosis/left obstructing ureteropelvic stone, s/p cystoscopy with retrograde pyelogram/ureteral stent, placement, Acute on Chronic renal failure  Hx:  Past Medical History  Diagnosis Date  . Normal exercise sestamibi stress test 2007    [facility]  . Diabetes mellitus   . Hyperlipidemia   . Hypertension   . Heart murmur   . Chronic kidney disease     CKD stage 3  . Depression   . Stroke 06/2011    [facility]   . Atrial fibrillation   . Pyelonephritis   . Ureteral stone with hydronephrosis   . Renal stone    Past Surgical History  Procedure Date  . Bladder surgery 1994  . Knee surgery as child  . Abdominal hysterectomy   . Cystoscopy w/ ureteral stent placement 02/27/2012    Procedure: CYSTOSCOPY WITH RETROGRADE PYELOGRAM/URETERAL STENT PLACEMENT;  Surgeon: Anner Crete, MD;  Location: [facility];  Service: Urology;  Laterality: Left;  cystoscopy, left rerograde pyelogram, insertion of left ureteral dual stents, and removal of bladder stone    Related Meds:     . ALPRAZolam  0.25 mg Oral Daily  . cholecalciferol  1,000 Units Oral Daily  . citalopram  20 mg Oral Daily  . diltiazem  120 mg Oral Daily  . docusate sodium  100 mg Oral BID  . insulin aspart  0-15 Units Subcutaneous TID WC  . insulin glargine  24 Units Subcutaneous QHS  . multivitamin with minerals  1 tablet Oral Daily  . piperacillin-tazobactam (ZOSYN)  IV  3.375 g Intravenous Q8H  . pregabalin  75 mg Oral BID     Ht: 5\' 5"  (165.1 cm)  Wt: 257 lb 8 oz (116.8 kg)  Ideal Wt: 57 kg  % Ideal Wt: 205  Usual Wt:  Wt Readings from Last 10 Encounters:  02/28/12 257 lb 8 oz (116.8 kg)  02/28/12 257 lb 8 oz (116.8 kg)  02/05/12 261 lb 14.5 oz (118.8 kg)  09/01/11 253 lb 14.4 oz (115.168 kg)  07/28/11 248 lb (112.492  kg)  07/09/11 241 lb 1.6 oz (109.362 kg)  07/01/11 249 lb 8 oz (113.172 kg)  06/26/11 250 lb 14.1 oz (113.8 kg)  06/17/11 252 lb 4.8 oz (114.443 kg)  06/02/11 251 lb (113.853 kg)    % Usual Wt: 102  Body mass index is 42.85 kg/(m^2).-extreme obesity  Labs:  CMP     Component Value Date/Time   NA 138 03/03/2012 0445   K 3.7 03/03/2012 0445   CL 104 03/03/2012 0445   CO2 23 03/03/2012 0445   GLUCOSE 119* 03/03/2012 0445   BUN 31* 03/03/2012 0445   CREATININE 2.20* 03/03/2012 0445   CREATININE 1.87* 07/28/2011 1452   CALCIUM 8.3* 03/03/2012 0445   PROT 7.4 02/27/2012 1002   ALBUMIN 3.2* 02/27/2012 1002   AST 15 02/27/2012 1002   ALT 9 02/27/2012 1002   ALKPHOS 118* 02/27/2012 1002   BILITOT 0.5 02/27/2012 1002   GFRNONAA 21* 03/03/2012 0445   GFRAA 25* 03/03/2012 0445    I/O last 3 completed shifts: In: 560 [P.O.:360; IV Piggyback:200] Out: -  Total I/O In: 480 [P.O.:480] Out: -    Diet Order: Carb Control  Supplements/Tube Feeding:  none  IVF:    Estimated Nutritional Needs:   Kcal: 1800-2000 Protein: 95-105 gm Fluid: 1.8-2L  Food/Nutrition Related Hx: Pt  reports following a diet at home but could not give the details.  Uses sugar when she needs to.  Does not eat meat every day because she does not have it.  Drinks mostly water.  Noted notes recommending SNF but pt refuses.  Intake of 0-100% meals here.  Tolerating diet.  NUTRITION DIAGNOSIS: -Inadequate oral intake (NI-2.1).  Status: Ongoing  RELATED TO: decreased appetite  AS EVIDENCE BY: documentation  MONITORING/EVALUATION(Goals): Intake, labs, weight Goal:  Intake >75% meals and supplements  EDUCATION NEEDS: -No education needs identified at this time  INTERVENTION: Ensure Complete bid, provide preferences  Dietitian [phone]  DOCUMENTATION CODES Per approved criteria  -Morbid Obesity    Derrell Lolling Anastasia Fiedler 03/03/2012, 1:51 PM

## 2012-03-04 ENCOUNTER — Encounter (HOSPITAL_COMMUNITY): Payer: Self-pay | Admitting: Urology

## 2012-03-04 ENCOUNTER — Encounter (HOSPITAL_COMMUNITY): Payer: Self-pay | Admitting: Anesthesiology

## 2012-03-04 ENCOUNTER — Encounter (HOSPITAL_COMMUNITY)
Admission: EM | Disposition: A | Discharge status: Skilled Nursing Facility | Payer: Self-pay | Source: Home / Self Care | Attending: Urology

## 2012-03-04 ENCOUNTER — Inpatient Hospital Stay (HOSPITAL_COMMUNITY): Payer: Medicare HMO | Admitting: Anesthesiology

## 2012-03-04 DIAGNOSIS — N3941 Urge incontinence: Secondary | ICD-10-CM

## 2012-03-04 DIAGNOSIS — R159 Full incontinence of feces: Secondary | ICD-10-CM

## 2012-03-04 HISTORY — DX: Urge incontinence: N39.41

## 2012-03-04 HISTORY — DX: Full incontinence of feces: R15.9

## 2012-03-04 HISTORY — PX: CYSTOSCOPY/RETROGRADE/URETEROSCOPY/STONE EXTRACTION WITH BASKET: SHX5317

## 2012-03-04 LAB — CULTURE, BLOOD (ROUTINE X 2): Culture: NO GROWTH

## 2012-03-04 LAB — BASIC METABOLIC PANEL
BUN: 29 mg/dL — ABNORMAL HIGH (ref 6–23)
CO2: 22 mEq/L (ref 19–32)
Chloride: 107 mEq/L (ref 96–112)
GFR calc Af Amer: 28 mL/min — ABNORMAL LOW (ref >90)
GFR calc non Af Amer: 24 mL/min — ABNORMAL LOW (ref >90)
Glucose, Bld: 93 mg/dL (ref 70–99)
Potassium: 3.6 mEq/L (ref 3.5–5.1)
Sodium: 142 mEq/L (ref 135–145)

## 2012-03-04 LAB — GLUCOSE, CAPILLARY
Glucose-Capillary: 153 mg/dL — ABNORMAL HIGH (ref 70–99)
Glucose-Capillary: 138 mg/dL — ABNORMAL HIGH (ref 70–99)

## 2012-03-04 SURGERY — CYSTOSCOPY, WITH CALCULUS REMOVAL USING BASKET
Anesthesia: General | Laterality: Left | Wound class: Clean Contaminated

## 2012-03-04 MED ORDER — SODIUM CHLORIDE 0.9 % IR SOLN
Status: DC | PRN
  Administered 2012-03-04: 4000 mL via INTRAVESICAL

## 2012-03-04 MED ORDER — IOHEXOL 300 MG/ML  SOLN
INTRAMUSCULAR | Status: DC | PRN
  Administered 2012-03-04: 15 mL

## 2012-03-04 MED ORDER — PROPOFOL 10 MG/ML IV EMUL
INTRAVENOUS | Status: DC | PRN
  Administered 2012-03-04: 100 ug/kg/min via INTRAVENOUS

## 2012-03-04 MED ORDER — MIDAZOLAM HCL 5 MG/5ML IJ SOLN
INTRAMUSCULAR | Status: DC | PRN
  Administered 2012-03-04 (×2): 1 mg via INTRAVENOUS

## 2012-03-04 MED ORDER — ONDANSETRON HCL 4 MG/2ML IJ SOLN
INTRAMUSCULAR | Status: DC | PRN
  Administered 2012-03-04: 4 mg via INTRAVENOUS

## 2012-03-04 MED ORDER — PROMETHAZINE HCL 25 MG/ML IJ SOLN: 6.2500 mg | INTRAMUSCULAR | Status: DC | PRN

## 2012-03-04 MED ORDER — LACTATED RINGERS IV SOLN: INTRAVENOUS | Status: DC

## 2012-03-04 MED ORDER — FENTANYL CITRATE 0.05 MG/ML IJ SOLN: 25.0000 ug | INTRAMUSCULAR | Status: DC | PRN

## 2012-03-04 MED ORDER — FENTANYL CITRATE 0.05 MG/ML IJ SOLN
INTRAMUSCULAR | Status: DC | PRN
  Administered 2012-03-04 (×2): 25 ug via INTRAVENOUS
  Administered 2012-03-04: 50 ug via INTRAVENOUS

## 2012-03-04 MED ORDER — LACTATED RINGERS IV SOLN
INTRAVENOUS | Status: DC | PRN
  Administered 2012-03-04: 12:00:00 via INTRAVENOUS

## 2012-03-04 SURGICAL SUPPLY — 13 items
BAG URO CATCHER STRL LF (DRAPE) ×2 IMPLANT
BASKET LASER NITINOL 1.9FR (BASKET) ×2 IMPLANT
CATH URET 5FR 28IN OPEN ENDED (CATHETERS) ×2 IMPLANT
CLOTH BEACON ORANGE TIMEOUT ST (SAFETY) ×2 IMPLANT
DRAPE CAMERA CLOSED 9X96 (DRAPES) ×2 IMPLANT
GLOVE SURG SS PI 8.0 STRL IVOR (GLOVE) ×2 IMPLANT
GOWN PREVENTION PLUS XLARGE (GOWN DISPOSABLE) ×2 IMPLANT
GOWN STRL REIN XL XLG (GOWN DISPOSABLE) ×2 IMPLANT
GUIDEWIRE STR DUAL SENSOR (WIRE) ×4 IMPLANT
MANIFOLD NEPTUNE II (INSTRUMENTS) ×2 IMPLANT
PACK CYSTO (CUSTOM PROCEDURE TRAY) ×2 IMPLANT
SHEATH ACCESS URETERAL 38CM (SHEATH) ×2 IMPLANT
TUBING CONNECTING 10 (TUBING) ×2 IMPLANT

## 2012-03-04 NOTE — Brief Op Note (Signed)
02/27/2012 - 03/04/2012  12:48 PM  PATIENT:  Tina [name]  71 y.o. female  PRE-OPERATIVE DIAGNOSIS:  LEFT URETERAL AND RENAL STONES  POST-OPERATIVE DIAGNOSIS:  LEFT URETERAL AND RENAL STONES  PROCEDURE:  Procedure(s) (LRB) with comments: CYSTOSCOPY/RETROGRADE/URETEROSCOPY/STONE EXTRACTION WITH BASKET (Left) - LEFT URETEROSCOPIC STONE EXTRACTION     SURGEON:  Surgeon(s) and Role:    * Anner Crete, MD - Primary  PHYSICIAN ASSISTANT:   ASSISTANTS: none   ANESTHESIA:   MAC  EBL:     BLOOD ADMINISTERED:none  DRAINS: none   LOCAL MEDICATIONS USED:  NONE  SPECIMEN:  Source of Specimen:  small stone given to family.    DISPOSITION OF SPECIMEN:  N/A  COUNTS:  YES  TOURNIQUET:  * No tourniquets in log *  DICTATION: .Other Dictation: Dictation Number [id number]  PLAN OF CARE: Admit to inpatient   PATIENT DISPOSITION:  PACU - hemodynamically stable.   Delay start of Pharmacological VTE agent (>24hrs) due to surgical blood loss or risk of bleeding: no

## 2012-03-04 NOTE — Progress Notes (Signed)
ANTIBIOTIC CONSULT NOTE  Pharmacy Consult for Zosyn Indication: Left Pyelonephritis  Allergies  Allergen Reactions  . Morphine And Related     Patient Measurements: Height: 5\' 5"  (165.1 cm) Weight: 257 lb 8 oz (116.8 kg) IBW/kg (Calculated) : 57   Vital Signs: Temp: 97.6 F (36.4 C) (10/24 1330) Temp src: Oral (10/24 0522) BP: 115/58 mmHg (10/24 1315) Pulse Rate: 91  (10/24 1330) Intake/Output from previous day: 10/23 0701 - 10/24 0700 In: 750 [P.O.:600; IV Piggyback:150] Out: -  Intake/Output from this shift: Total I/O In: 790 [I.V.:790] Out: -   Labs:  Basename 03/04/12 0459 03/03/12 0445 03/02/12 0446  WBC -- 7.6 6.3  HGB -- 9.1* 9.8*  PLT -- 174 160  LABCREA -- -- --  CREATININE 2.01* 2.20* 2.22*   Estimated Creatinine Clearance: 33.3 ml/min (by C-G formula based on Cr of 2.01). No results found for this basename: VANCOTROUGH:2,VANCOPEAK:2,VANCORANDOM:2,GENTTROUGH:2,GENTPEAK:2,GENTRANDOM:2,TOBRATROUGH:2,TOBRAPEAK:2,TOBRARND:2,AMIKACINPEAK:2,AMIKACINTROU:2,AMIKACIN:2, in the last 72 hours   Microbiology: Recent Results (from the past 720 hour(s))  CLOSTRIDIUM DIFFICILE BY PCR     Status: Normal   Collection Time   02/03/12  3:31 PM      Component Value Range Status Comment   C difficile by pcr NEGATIVE  NEGATIVE Final   URINE CULTURE     Status: Normal   Collection Time   02/05/12  1:07 AM      Component Value Range Status Comment   Specimen Description URINE, RANDOM   Final    Special Requests NONE   Final    Culture  Setup Time 02/05/2012 09:22   Final    Colony Count NO GROWTH   Final    Culture NO GROWTH   Final    Report Status 02/06/2012 FINAL   Final   URINE CULTURE     Status: Normal   Collection Time   02/27/12 10:36 AM      Component Value Range Status Comment   Specimen Description URINE, CLEAN CATCH   Final    Special Requests NONE   Final    Culture  Setup Time 02/27/2012 11:46   Final    Colony Count >=100,000 COLONIES/ML   Final    Culture ENTEROBACTER CLOACAE   Final    Report Status 02/29/2012 FINAL   Final    Organism ID, Bacteria ENTEROBACTER CLOACAE   Final   URINE CULTURE     Status: Normal   Collection Time   02/27/12 11:43 AM      Component Value Range Status Comment   Specimen Description URINE, CATHETERIZED   Final    Special Requests ADDED 02/27/12 1415   Final    Culture  Setup Time 02/27/2012 14:44   Final    Colony Count >=100,000 COLONIES/ML   Final    Culture ENTEROBACTER CLOACAE   Final    Report Status 02/29/2012 FINAL   Final    Organism ID, Bacteria ENTEROBACTER CLOACAE   Final   CULTURE, BLOOD (ROUTINE X 2)     Status: Normal   Collection Time   02/27/12  3:15 PM      Component Value Range Status Comment   Specimen Description BLOOD FOREARM RIGHT   Final    Special Requests BOTTLES DRAWN AEROBIC ONLY 3CC PT ON ROCEPHIN   Final    Culture  Setup Time 02/27/2012 21:42   Final    Culture NO GROWTH 5 DAYS   Final    Report Status 03/04/2012 FINAL   Final   CULTURE, BLOOD (ROUTINE X  2)     Status: Normal   Collection Time   02/27/12  3:23 PM      Component Value Range Status Comment   Specimen Description BLOOD HAND RIGHT   Final    Special Requests BOTTLES DRAWN AEROBIC ONLY 10CC PT ON ROCEPHIN   Final    Culture  Setup Time 02/27/2012 21:42   Final    Culture NO GROWTH 5 DAYS   Final    Report Status 03/04/2012 FINAL   Final   STONE ANALYSIS     Status: Normal   Collection Time   02/27/12  6:08 PM      Component Value Range Status Comment   Nidus Not observed   Final    Component 1 KSTONE See Below   Final    Stone Weight KSTONE 0.0210   Final   URINE CULTURE     Status: Normal   Collection Time   02/27/12  6:59 PM      Component Value Range Status Comment   Specimen Description URINE, RANDOM FROM LEFT LOWER POLE   Final    Special Requests NONE   Final    Culture  Setup Time 02/28/2012 02:17   Final    Colony Count >=100,000 COLONIES/ML   Final    Culture ENTEROBACTER CLOACAE    Final    Report Status 03/01/2012 FINAL   Final    Organism ID, Bacteria ENTEROBACTER CLOACAE   Final     Medical History: Past Medical History  Diagnosis Date  . Normal exercise sestamibi stress test 2007    [facility]  . Diabetes mellitus   . Hyperlipidemia   . Hypertension   . Heart murmur   . Chronic kidney disease     CKD stage 3  . Depression   . Stroke 06/2011    [facility]   . Atrial fibrillation   . Pyelonephritis   . Ureteral stone with hydronephrosis   . Renal stone     Medications:  Anti-infectives     Start     Dose/Rate Route Frequency Ordered Stop   03/01/12 0900   piperacillin-tazobactam (ZOSYN) IVPB 3.375 g        3.375 g 12.5 mL/hr over 240 Minutes Intravenous 3 times per day 03/01/12 0805     02/27/12 2300   ampicillin-sulbactam (UNASYN) 1.5 g in sodium chloride 0.9 % 50 mL IVPB  Status:  Discontinued        1.5 g 100 mL/hr over 30 Minutes Intravenous Every 8 hours 02/27/12 2123 03/01/12 0757   02/27/12 2200   ciprofloxacin (CIPRO) IVPB 200 mg  Status:  Discontinued        200 mg 100 mL/hr over 60 Minutes Intravenous Every 12 hours 02/27/12 2123 03/01/12 0757   02/27/12 1415   Ampicillin-Sulbactam (UNASYN) 3 g in sodium chloride 0.9 % 100 mL IVPB        3 g 100 mL/hr over 60 Minutes Intravenous  Once 02/27/12 1409 02/27/12 1625   02/27/12 1330   cefTRIAXone (ROCEPHIN) 1 g in dextrose 5 % 50 mL IVPB        1 g 100 mL/hr over 30 Minutes Intravenous  Once 02/27/12 1317 02/27/12 1455         Assessment:  71 yo F s/p cyctoscopy w/ retrograde pyelogram/ureteral stent placement on 10/18.  Now with Enterobacter cloacae sens only to Zosyn growing in urine Cx.  Day #4 Zosyn.  Afebrile, WBCs wnl.  SCr 2.01, CrCl 33.  Plan:  Zosyn 3.375g IV Q8H infused over 4hrs.  F/u daily.  Charolotte Eke, PharmD, pager [phone]. 03/04/2012,1:39 PM.

## 2012-03-04 NOTE — Progress Notes (Signed)
CSW met with patient. Patient is now agreeable to snf at [facility]. CSW faxed patient information out and placed FL-2 on chart.  Tina [name] MSW, LCSW [phone]

## 2012-03-04 NOTE — Anesthesia Postprocedure Evaluation (Signed)
  Anesthesia Post-op Note  Patient: Tina [name]  Procedure(s) Performed: Procedure(s) (LRB): CYSTOSCOPY/RETROGRADE/URETEROSCOPY/STONE EXTRACTION WITH BASKET (Left)  Patient Location: PACU  Anesthesia Type: General  Level of Consciousness: awake and alert   Airway and Oxygen Therapy: Patient Spontanous Breathing  Post-op Pain: mild  Post-op Assessment: Post-op Vital signs reviewed, Patient's Cardiovascular Status Stable, Respiratory Function Stable, Patent Airway and No signs of Nausea or vomiting  Post-op Vital Signs: stable  Complications: No apparent anesthesia complications

## 2012-03-04 NOTE — Progress Notes (Signed)
[facility]             Progress Note   Subjective: No complaints. Is now agreeing to go to SNF.  Objective: Vital signs in last 24 hours: Temp:  [97.5 F (36.4 C)-98.6 F (37 C)] 97.5 F (36.4 C) (10/24 1340) Pulse Rate:  [66-103] 88  (10/24 1340) Resp:  [14-20] 16  (10/24 1340) BP: (97-138)/(48-79) 113/58 mmHg (10/24 1340) SpO2:  [94 %-100 %] 99 % (10/24 1340) Weight change:  Last BM Date: 03/03/12  Intake/Output from previous day: 10/23 0701 - 10/24 0700 In: 750 [P.O.:600; IV Piggyback:150] Out: -  Total I/O In: 790 [I.V.:790] Out: -    Physical Exam: Alert, oriented, obese, [place]/AT, wears corrective lenses, RRR, CTA B, 1+ edema bilaterally, soft, nondistended, Pos bowel sounds.   Lab Results: Basic Metabolic Panel:  Basename 03/04/12 0459 03/03/12 0445  NA 142 138  K 3.6 3.7  CL 107 104  CO2 22 23  GLUCOSE 93 119*  BUN 29* 31*  CREATININE 2.01* 2.20*  CALCIUM 8.3* 8.3*  MG -- --  PHOS -- --   CBC:  Basename 03/03/12 0445 03/02/12 0446  WBC 7.6 6.3  NEUTROABS -- --  HGB 9.1* 9.8*  HCT 27.5* 29.8*  MCV 82.1 82.5  PLT 174 160   CBG:  Basename 03/04/12 1313 03/04/12 1108 03/04/12 0753 03/03/12 2051 03/03/12 0741 03/02/12 2121  GLUCAP 94 83 84 132* 105* 198*    Recent Results (from the past 240 hour(s))  URINE CULTURE     Status: Normal   Collection Time   02/27/12 10:36 AM      Component Value Range Status Comment   Specimen Description URINE, CLEAN CATCH   Final    Special Requests NONE   Final    Culture  Setup Time 02/27/2012 11:46   Final    Colony Count >=100,000 COLONIES/ML   Final    Culture ENTEROBACTER CLOACAE   Final    Report Status 02/29/2012 FINAL   Final    Organism ID, Bacteria ENTEROBACTER CLOACAE   Final   URINE CULTURE     Status: Normal   Collection Time   02/27/12 11:43 AM      Component Value Range Status Comment   Specimen Description URINE, CATHETERIZED   Final    Special Requests ADDED 02/27/12 1415    Final    Culture  Setup Time 02/27/2012 14:44   Final    Colony Count >=100,000 COLONIES/ML   Final    Culture ENTEROBACTER CLOACAE   Final    Report Status 02/29/2012 FINAL   Final    Organism ID, Bacteria ENTEROBACTER CLOACAE   Final   CULTURE, BLOOD (ROUTINE X 2)     Status: Normal   Collection Time   02/27/12  3:15 PM      Component Value Range Status Comment   Specimen Description BLOOD FOREARM RIGHT   Final    Special Requests BOTTLES DRAWN AEROBIC ONLY 3CC PT ON ROCEPHIN   Final    Culture  Setup Time 02/27/2012 21:42   Final    Culture NO GROWTH 5 DAYS   Final    Report Status 03/04/2012 FINAL   Final   CULTURE, BLOOD (ROUTINE X 2)     Status: Normal   Collection Time   02/27/12  3:23 PM      Component Value Range Status Comment   Specimen Description BLOOD HAND RIGHT   Final    Special Requests BOTTLES DRAWN  AEROBIC ONLY 10CC PT ON ROCEPHIN   Final    Culture  Setup Time 02/27/2012 21:42   Final    Culture NO GROWTH 5 DAYS   Final    Report Status 03/04/2012 FINAL   Final   STONE ANALYSIS     Status: Normal   Collection Time   02/27/12  6:08 PM      Component Value Range Status Comment   Nidus Not observed   Final    Component 1 KSTONE See Below   Final    Stone Weight KSTONE 0.0210   Final   URINE CULTURE     Status: Normal   Collection Time   02/27/12  6:59 PM      Component Value Range Status Comment   Specimen Description URINE, RANDOM FROM LEFT LOWER POLE   Final    Special Requests NONE   Final    Culture  Setup Time 02/28/2012 02:17   Final    Colony Count >=100,000 COLONIES/ML   Final    Culture ENTEROBACTER CLOACAE   Final    Report Status 03/01/2012 FINAL   Final    Organism ID, Bacteria ENTEROBACTER CLOACAE   Final     Studies/Results: US Renal  03/02/2012  *RADIOLOGY REPORT*  Clinical Data: Renal failure.  RENAL/URINARY TRACT ULTRASOUND COMPLETE  Comparison:  Abdominal CT 02/27/2012  Findings:  Right Kidney:  Right kidney measures 9.5 cm in length.   The lower pole is partially obscured by bowel gas.  No evidence for right hydronephrosis. There is a slightly hypoechoic structure near the renal hilum that measures up to 1.7 cm.  This most likely represents a parapelvic cyst based on the previous CT study.  Left Kidney:  Left kidney measures 11.2 cm in length without hydronephrosis.  Hypoechoic structure in the mid pole measures up to 1.4 cm and probably represents a cystic structure.  There is a 5 mm echogenic structure with acoustic shadowing in the lower pole. Findings are consistent with a stone.  There is also mildly hypoechoic structure in the lower pole that measures up to 1.9 cm.  Bladder: Small amount of fluid in the urinary bladder.  There appears to be a ureteral stent within the bladder.  There is concern for posterior bladder wall thickening or debris.  IMPRESSION: No evidence for hydronephrosis.  There are heterogeneous hypoechoic structures in both kidneys that most likely represent parapelvic cysts.  5 mm left kidney stone.  Debris or posterior wall thickening in the bladder.   Original Report Authenticated By: Richarda Overlie, M.D.     Medications: Scheduled Meds:    . ALPRAZolam  0.25 mg Oral Daily  . cholecalciferol  1,000 Units Oral Daily  . citalopram  20 mg Oral Daily  . diltiazem  120 mg Oral Daily  . docusate sodium  100 mg Oral BID  . feeding supplement  237 mL Oral BID BM  . insulin aspart  0-15 Units Subcutaneous TID WC  . insulin glargine  24 Units Subcutaneous QHS  . multivitamin with minerals  1 tablet Oral Daily  . piperacillin-tazobactam (ZOSYN)  IV  3.375 g Intravenous Q8H  . pregabalin  75 mg Oral BID   Continuous Infusions:    . DISCONTD: lactated ringers     PRN Meds:.acetaminophen, bisacodyl, diclofenac sodium, fentaNYL, hyoscyamine, meclizine, ondansetron, traMADol, zolpidem, DISCONTD: fentaNYL, DISCONTD: iohexol, DISCONTD: promethazine, DISCONTD: sodium chloride irrigation  Assessment/Plan:  Principal  Problem:  *Ureteral stone with hydronephrosis Active Problems:  DM (diabetes mellitus)  with complications  Pyelonephritis  Chronic cystitis  Hydronephrosis   Hydronephrosis/Left obstructing ureteropelvic stone with Pyelonephritis -Had ureteroscopy/stone extraction this am by Dr. Annabell Howells. -Most recent US shows no hydronephrosis. -Continue Zosyn. -Urine cx with enterobacter only sensitive to IV meds: would recommend a total of 10 days of treatment.  Acute on CKD Stage III -Cr is improving. -Suspect acute worsening 2/2 #1. -Recheck BMET in am.  DM -Well controlled.  Mild Thrombocytopenia -Resolved.  AOCD -Hb stable. -No need for transfusion unless Hb <7.  Disposition -PT recs SNF. -Patient is now agreeable to proceed. -Will sign off. Please call us back with questions.  Time spent coordinating care: 20 minutes.   LOS: 6 days   [facility] Pager: [phone] 03/04/2012, 3:24 PM

## 2012-03-04 NOTE — Progress Notes (Signed)
Patient ID: Tina [name], female   DOB: 1941/04/19, 71 y.o.   MRN: [id number]  Audie is doing well post op from her left ureteroscopy.    She has some left flank pain.   She continues to have urinary frequency with urge incontinence and also fecal incontinence with loose stool.  She is being evaluated for SNF placement.  She should remain on the zosyn through the weekend.   I will order a c. Diff check because of her frequent loose stools.

## 2012-03-04 NOTE — Transfer of Care (Signed)
Immediate Anesthesia Transfer of Care Note  Patient: Tina [name] Area  Procedure(s) Performed: Procedure(s) (LRB) with comments: CYSTOSCOPY/RETROGRADE/URETEROSCOPY/STONE EXTRACTION WITH BASKET (Left) - LEFT URETEROSCOPIC STONE EXTRACTION     Patient Location: PACU  Anesthesia Type: MAC  Level of Consciousness: awake, alert  and oriented  Airway & Oxygen Therapy: Patient Spontanous Breathing and Patient connected to face mask oxygen  Post-op Assessment: Report given to PACU RN and Post -op Vital signs reviewed and stable  Post vital signs: Reviewed and stable  Complications: No apparent anesthesia complications

## 2012-03-04 NOTE — Care Management (Signed)
I spoke with the patient's son and daughter and they are incapable of having her return to the home.   This patient needs skilled nursing.

## 2012-03-04 NOTE — Anesthesia Preprocedure Evaluation (Addendum)
Anesthesia Evaluation  Patient identified by MRN, date of birth, ID band Patient awake    Reviewed: Allergy & Precautions, H&P , NPO status , Patient's Chart, lab work & pertinent test results  Airway Mallampati: III TM Distance: <3 FB Neck ROM: Limited    Dental No notable dental hx.    Pulmonary neg pulmonary ROS,    + decreased breath sounds      Cardiovascular hypertension, Pt. on medications + dysrhythmias Atrial Fibrillation Rhythm:Regular Rate:Normal     Neuro/Psych CVA negative psych ROS   GI/Hepatic negative GI ROS, Neg liver ROS,   Endo/Other  diabetes, Poorly Controlled, Insulin DependentMorbid obesity  Renal/GU Renal InsufficiencyRenal disease  negative genitourinary   Musculoskeletal negative musculoskeletal ROS (+)   Abdominal   Peds negative pediatric ROS (+)  Hematology negative hematology ROS (+)   Anesthesia Other Findings   Reproductive/Obstetrics negative OB ROS                          Anesthesia Physical Anesthesia Plan  ASA: III  Anesthesia Plan: MAC   Post-op Pain Management:    Induction:   Airway Management Planned:   Additional Equipment:   Intra-op Plan:   Post-operative Plan:   Informed Consent: I have reviewed the patients History and Physical, chart, labs and discussed the procedure including the risks, benefits and alternatives for the proposed anesthesia with the patient or authorized representative who has indicated his/her understanding and acceptance.   Dental advisory given  Plan Discussed with: CRNA and Surgeon  Anesthesia Plan Comments:        Anesthesia Quick Evaluation

## 2012-03-05 ENCOUNTER — Encounter (HOSPITAL_COMMUNITY): Payer: Self-pay | Admitting: Urology

## 2012-03-05 DIAGNOSIS — N1330 Unspecified hydronephrosis: Secondary | ICD-10-CM

## 2012-03-05 LAB — BASIC METABOLIC PANEL
BUN: 27 mg/dL — ABNORMAL HIGH (ref 6–23)
Calcium: 8.6 mg/dL (ref 8.4–10.5)
Creatinine, Ser: 2 mg/dL — ABNORMAL HIGH (ref 0.50–1.10)
GFR calc Af Amer: 28 mL/min — ABNORMAL LOW (ref >90)
GFR calc non Af Amer: 24 mL/min — ABNORMAL LOW (ref >90)

## 2012-03-05 LAB — GLUCOSE, CAPILLARY

## 2012-03-05 LAB — CBC
HCT: 30 % — ABNORMAL LOW (ref 36.0–46.0)
MCH: 27.4 pg (ref 26.0–34.0)
MCHC: 32.7 g/dL (ref 30.0–36.0)
MCV: 83.8 fL (ref 78.0–100.0)
Platelets: 275 10*3/uL (ref 150–400)
RDW: 15.3 % (ref 11.5–15.5)

## 2012-03-05 LAB — CLOSTRIDIUM DIFFICILE BY PCR: Toxigenic C. Difficile by PCR: NEGATIVE

## 2012-03-05 MED ORDER — DILTIAZEM HCL 100 MG IV SOLR
5.0000 mg/h | INTRAVENOUS | Status: DC
  Administered 2012-03-05 (×2): 15 mg/h via INTRAVENOUS
  Administered 2012-03-05 (×2): 10 mg/h via INTRAVENOUS
  Administered 2012-03-05: 20 mg/h via INTRAVENOUS
  Administered 2012-03-05: 15 mg/h via INTRAVENOUS
  Administered 2012-03-05: 20 mg/h via INTRAVENOUS
  Administered 2012-03-06: 5 mg/h via INTRAVENOUS
  Administered 2012-03-06: 10 mg/h via INTRAVENOUS
  Administered 2012-03-06: 15 mg/h via INTRAVENOUS
  Filled 2012-03-05 (×3): qty 100

## 2012-03-05 MED ORDER — METOPROLOL TARTRATE 25 MG PO TABS
25.0000 mg | ORAL_TABLET | Freq: Two times a day (BID) | ORAL | Status: DC
  Administered 2012-03-05 (×2): 25 mg via ORAL
  Filled 2012-03-05 (×5): qty 1

## 2012-03-05 MED ORDER — METOPROLOL TARTRATE 1 MG/ML IV SOLN
5.0000 mg | Freq: Once | INTRAVENOUS | Status: AC
  Administered 2012-03-05: 5 mg via INTRAVENOUS
  Filled 2012-03-05: qty 5

## 2012-03-05 MED ORDER — RIVAROXABAN 15 MG PO TABS
15.0000 mg | ORAL_TABLET | Freq: Every day | ORAL | Status: DC
  Administered 2012-03-05: 15 mg via ORAL
  Filled 2012-03-05 (×2): qty 1

## 2012-03-05 MED ORDER — DILTIAZEM LOAD VIA INFUSION
10.0000 mg | Freq: Once | INTRAVENOUS | Status: AC
  Administered 2012-03-05: 10 mg via INTRAVENOUS
  Filled 2012-03-05: qty 10

## 2012-03-05 MED ORDER — METOPROLOL TARTRATE 1 MG/ML IV SOLN
2.5000 mg | Freq: Once | INTRAVENOUS | Status: AC
  Administered 2012-03-05: 2.5 mg via INTRAVENOUS
  Filled 2012-03-05: qty 5
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
## 2012-03-05 NOTE — Progress Notes (Signed)
[facility]             Progress Note   Subjective: Developed a flutter overnight.  Objective: Vital signs in last 24 hours: Temp:  [97.2 F (36.2 C)-100.8 F (38.2 C)] 99 F (37.2 C) (10/25 1400) Pulse Rate:  [89-144] 89  (10/25 1400) Resp:  [22] 22  (10/25 0525) BP: (113-158)/(54-103) 119/65 mmHg (10/25 1400) SpO2:  [90 %-100 %] 96 % (10/25 1400) Weight change:  Last BM Date: 03/04/12  Intake/Output from previous day: 10/24 0701 - 10/25 0700 In: 1570.2 [P.O.:390; I.V.:1030.2; IV Piggyback:150] Out: -      Physical Exam: Alert, oriented, obese, [place]/AT, wears corrective lenses, RRR, CTA B, 1+ edema bilaterally, soft, nondistended, Pos bowel sounds.   Lab Results: Basic Metabolic Panel:  Basename 03/05/12 0446 03/04/12 0459  NA 136 142  K 3.9 3.6  CL 103 107  CO2 22 22  GLUCOSE 108* 93  BUN 27* 29*  CREATININE 2.00* 2.01*  CALCIUM 8.6 8.3*  MG -- --  PHOS -- --   CBC:  Basename 03/05/12 0446 03/03/12 0445  WBC 13.7* 7.6  NEUTROABS -- --  HGB 9.8* 9.1*  HCT 30.0* 27.5*  MCV 83.8 82.1  PLT 275 174   CBG:  Basename 03/05/12 1227 03/05/12 0738 03/04/12 2026 03/04/12 1722 03/04/12 1313 03/04/12 1108  GLUCAP 161* 90 138* 153* 94 83    Recent Results (from the past 240 hour(s))  URINE CULTURE     Status: Normal   Collection Time   02/27/12 10:36 AM      Component Value Range Status Comment   Specimen Description URINE, CLEAN CATCH   Final    Special Requests NONE   Final    Culture  Setup Time 02/27/2012 11:46   Final    Colony Count >=100,000 COLONIES/ML   Final    Culture ENTEROBACTER CLOACAE   Final    Report Status 02/29/2012 FINAL   Final    Organism ID, Bacteria ENTEROBACTER CLOACAE   Final   URINE CULTURE     Status: Normal   Collection Time   02/27/12 11:43 AM      Component Value Range Status Comment   Specimen Description URINE, CATHETERIZED   Final    Special Requests ADDED 02/27/12 1415   Final    Culture  Setup Time  02/27/2012 14:44   Final    Colony Count >=100,000 COLONIES/ML   Final    Culture ENTEROBACTER CLOACAE   Final    Report Status 02/29/2012 FINAL   Final    Organism ID, Bacteria ENTEROBACTER CLOACAE   Final   CULTURE, BLOOD (ROUTINE X 2)     Status: Normal   Collection Time   02/27/12  3:15 PM      Component Value Range Status Comment   Specimen Description BLOOD FOREARM RIGHT   Final    Special Requests BOTTLES DRAWN AEROBIC ONLY 3CC PT ON ROCEPHIN   Final    Culture  Setup Time 02/27/2012 21:42   Final    Culture NO GROWTH 5 DAYS   Final    Report Status 03/04/2012 FINAL   Final   CULTURE, BLOOD (ROUTINE X 2)     Status: Normal   Collection Time   02/27/12  3:23 PM      Component Value Range Status Comment   Specimen Description BLOOD HAND RIGHT   Final    Special Requests BOTTLES DRAWN AEROBIC ONLY 10CC PT ON ROCEPHIN   Final  Culture  Setup Time 02/27/2012 21:42   Final    Culture NO GROWTH 5 DAYS   Final    Report Status 03/04/2012 FINAL   Final   STONE ANALYSIS     Status: Normal   Collection Time   02/27/12  6:08 PM      Component Value Range Status Comment   Nidus Not observed   Final    Component 1 KSTONE See Below   Final    Stone Weight KSTONE 0.0210   Final   URINE CULTURE     Status: Normal   Collection Time   02/27/12  6:59 PM      Component Value Range Status Comment   Specimen Description URINE, RANDOM FROM LEFT LOWER POLE   Final    Special Requests NONE   Final    Culture  Setup Time 02/28/2012 02:17   Final    Colony Count >=100,000 COLONIES/ML   Final    Culture ENTEROBACTER CLOACAE   Final    Report Status 03/01/2012 FINAL   Final    Organism ID, Bacteria ENTEROBACTER CLOACAE   Final   CLOSTRIDIUM DIFFICILE BY PCR     Status: Normal   Collection Time   03/05/12 12:30 AM      Component Value Range Status Comment   C difficile by pcr NEGATIVE  NEGATIVE Final     Studies/Results: No results found.  Medications: Scheduled Meds:    . ALPRAZolam   0.25 mg Oral Daily  . cholecalciferol  1,000 Units Oral Daily  . citalopram  20 mg Oral Daily  . diltiazem  10 mg Intravenous Once  . docusate sodium  100 mg Oral BID  . feeding supplement  237 mL Oral BID BM  . insulin aspart  0-15 Units Subcutaneous TID WC  . insulin glargine  24 Units Subcutaneous QHS  . metoprolol  2.5 mg Intravenous Once  . metoprolol  5 mg Intravenous Once  . metoprolol tartrate  25 mg Oral BID  . multivitamin with minerals  1 tablet Oral Daily  . piperacillin-tazobactam (ZOSYN)  IV  3.375 g Intravenous Q8H  . pregabalin  75 mg Oral BID  . rivaroxaban  15 mg Oral Daily  . DISCONTD: diltiazem  120 mg Oral Daily   Continuous Infusions:    . diltiazem (CARDIZEM) infusion 15 mg/hr (03/05/12 1221)   PRN Meds:.acetaminophen, bisacodyl, diclofenac sodium, fentaNYL, hyoscyamine, meclizine, ondansetron, traMADol, zolpidem  Assessment/Plan:  Active Problems:  DM (diabetes mellitus) with complications  Pyelonephritis  Chronic cystitis  Hydronephrosis  Urge incontinence  Fecal incontinence   Hydronephrosis/Left obstructing ureteropelvic stone with Pyelonephritis -Had ureteroscopy/stone extraction 10/24by Dr. Annabell Howells. -Most recent US shows no hydronephrosis. -Continue Zosyn. -Urine cx with enterobacter only sensitive to IV meds: would recommend a total of 10 days of treatment.  Acute on CKD Stage III -Cr is improving. -Suspect acute worsening 2/2 #1. -Recheck BMET in am.  DM -Fair control. -No medication changes.  Mild Thrombocytopenia -Resolved.  AOCD -Hb stable. -No need for transfusion unless Hb <7.  A Fib/Flutter -Developed RVR overnight. -Was started on a cardizem drip as well as PO lopressor. -Cardizem drip is being weaned to off. -Dr. Isabel [facility]rice [facility] cards) is on board.  Disposition -PT recs SNF. -Patient is now agreeable to proceed.   Time spent coordinating care: 20 minutes.   LOS: 7 days   [facility] Pager: [phone] 03/05/2012, 3:17 PM

## 2012-03-05 NOTE — Progress Notes (Addendum)
RRT note: Pt's nurse called me to check on pt after having HR increase to 130s/140s nonsustained aflutter. Pt asymptomatic and already had cardizem gtt infusing per orders @15mg /hr. When I got to room pt's HR was in 90s-100s aflutter. 2304- 120/56, 103; 2309- 114/71, 98. Pt resting in chair. MD was already called by nurse before calling me and she did not receive any new orders. On assessment, pt is asymptomatic; pt is lethargic, per charting this is her baseline. I told Debbie RN to call me if her HR sustains above target range and/or becomes symptomatic with aflutter.

## 2012-03-05 NOTE — Progress Notes (Signed)
1 Day Post-Op Subjective: Patient reports tolerating PO.  Sleeping.  States her pain is well controlled.  Denies CP, SOB, N/V.     Last night pt had increased BP and HR.  12 lead revealed aflutter.  Has hx of afib on PO cardizem.  IV metoprolol given with improved BP but no reduction in HR.  IM now following and Cardizem drip was started.  Cardiology to see today.    Objective: Vital signs in last 24 hours: Temp:  [97.2 F (36.2 C)-100.8 F (38.2 C)] 100.3 F (37.9 C) (10/25 0650) Pulse Rate:  [66-144] 142  (10/25 0650) Resp:  [14-22] 22  (10/25 0525) BP: (104-158)/(54-103) 113/54 mmHg (10/25 0650) SpO2:  [90 %-100 %] 94 % (10/25 0529)  Intake/Output from previous day: 10/24 0701 - 10/25 0700 In: 1570.2 [P.O.:390; I.V.:1030.2; IV Piggyback:150] Out: -  Intake/Output this shift:    Physical Exam:  General:cooperative sleepy Cardiac:  Tachy, reg rhythm Lungs: decreased BS bases GI: soft, non tender, normal bowel sounds    Lab Results:  Basename 03/05/12 0446 03/03/12 0445  HGB 9.8* 9.1*  HCT 30.0* 27.5*   BMET  Basename 03/05/12 0446 03/04/12 0459  NA 136 142  K 3.9 3.6  CL 103 107  CO2 22 22  GLUCOSE 108* 93  BUN 27* 29*  CREATININE 2.00* 2.01*  CALCIUM 8.6 8.3*   No results found for this basename: LABPT:3,INR:3 in the last 72 hours No results found for this basename: LABURIN:1 in the last 72 hours Results for orders placed during the hospital encounter of 02/27/12  URINE CULTURE     Status: Normal   Collection Time   02/27/12 10:36 AM      Component Value Range Status Comment   Specimen Description URINE, CLEAN CATCH   Final    Special Requests NONE   Final    Culture  Setup Time 02/27/2012 11:46   Final    Colony Count >=100,000 COLONIES/ML   Final    Culture ENTEROBACTER CLOACAE   Final    Report Status 02/29/2012 FINAL   Final    Organism ID, Bacteria ENTEROBACTER CLOACAE   Final   URINE CULTURE     Status: Normal   Collection Time   02/27/12  11:43 AM      Component Value Range Status Comment   Specimen Description URINE, CATHETERIZED   Final    Special Requests ADDED 02/27/12 1415   Final    Culture  Setup Time 02/27/2012 14:44   Final    Colony Count >=100,000 COLONIES/ML   Final    Culture ENTEROBACTER CLOACAE   Final    Report Status 02/29/2012 FINAL   Final    Organism ID, Bacteria ENTEROBACTER CLOACAE   Final   CULTURE, BLOOD (ROUTINE X 2)     Status: Normal   Collection Time   02/27/12  3:15 PM      Component Value Range Status Comment   Specimen Description BLOOD FOREARM RIGHT   Final    Special Requests BOTTLES DRAWN AEROBIC ONLY 3CC PT ON ROCEPHIN   Final    Culture  Setup Time 02/27/2012 21:42   Final    Culture NO GROWTH 5 DAYS   Final    Report Status 03/04/2012 FINAL   Final   CULTURE, BLOOD (ROUTINE X 2)     Status: Normal   Collection Time   02/27/12  3:23 PM      Component Value Range Status Comment   Specimen Description  BLOOD HAND RIGHT   Final    Special Requests BOTTLES DRAWN AEROBIC ONLY 10CC PT ON ROCEPHIN   Final    Culture  Setup Time 02/27/2012 21:42   Final    Culture NO GROWTH 5 DAYS   Final    Report Status 03/04/2012 FINAL   Final   STONE ANALYSIS     Status: Normal   Collection Time   02/27/12  6:08 PM      Component Value Range Status Comment   Nidus Not observed   Final    Component 1 KSTONE See Below   Final    Stone Weight KSTONE 0.0210   Final   URINE CULTURE     Status: Normal   Collection Time   02/27/12  6:59 PM      Component Value Range Status Comment   Specimen Description URINE, RANDOM FROM LEFT LOWER POLE   Final    Special Requests NONE   Final    Culture  Setup Time 02/28/2012 02:17   Final    Colony Count >=100,000 COLONIES/ML   Final    Culture ENTEROBACTER CLOACAE   Final    Report Status 03/01/2012 FINAL   Final    Organism ID, Bacteria ENTEROBACTER CLOACAE   Final     Studies/Results: No results found.  Assessment/Plan: 1 Day Post-Op Procedure(s)  (LRB): CYSTOSCOPY/RETROGRADE/URETEROSCOPY/STONE EXTRACTION WITH BASKET (Left)  Continue pain meds prn  Continue zosyn.  Will need to recheck UA prior to d/c to ensure resolution of UTI  Cardiology to see today regarding aflutter  SW involved with d/c planning when pt is medically ready.     LOS: 7 days   YARBROUGH,Mathius Birkeland G. 03/05/2012, 9:20 AM

## 2012-03-05 NOTE — Progress Notes (Signed)
Patient's HR was elevated to 140 and BP 148/78. RN notified, the PA on call. After obtaining a 12-lead EKG, the pt's rhythm is atrial flutter. The PA also ordered for the HR and BP to be checked every 2 hours and for the patient to be placed on the heart monitor. RN called the PA back to update her on the pt's rhythm. PA ordered for 2.5mg  metoprolol to help lower the HR. RN will continue to monitor HR and rhythm closely.

## 2012-03-05 NOTE — Progress Notes (Signed)
RN spoke to MD [facility], about the patient sustaining in Atrial flutter. He is requesting that the hospitalists continue to follow patient concerning the heart rhythm. NP, Donnamarie Poag was paged, RN waiting for a response.

## 2012-03-05 NOTE — Progress Notes (Signed)
Shift event:  RN paged NP around 0030 2/2 pt in Aflutter. Pt never c/o chest pain or other sx. She paged urology who asked [place] to manage. (we had been consulting on pt but signed off on 10/24).  12 lead showed flutter with rate in the 140s. At that point, Cardizem was started after Metoprolol given but failed. RN paged NP again at [phone] to say that pt hadn't responded to the Cardizem gtt which was now on 20mg . Pt has a hx of Afib on her chart and was on Diltiazem po, however, pt can not tell us who her cardiologist is, and this NP searched in past notes and can not find one in her chart notes. [facility] cardiology paged at [phone] and Dr. Neila Gear returned call. He agreed to see pt and asked that we place her on po Metoprolol which I have done. He will see pt this a.m.  Oncoming [facility] doc, Dr. Barnie Del, aware of situation. Her HR remains in the 130-140s range and BP is holding well on the Card gtt.  Maren Reamer, NP [facility]

## 2012-03-05 NOTE — Progress Notes (Signed)
[employer] notified that pt is sustaining 120's-130's HR. She is on a Cardizem drip @ 24mls/hr. Will notifiy physician.

## 2012-03-05 NOTE — Progress Notes (Signed)
Pharmacy brief note  Following patient for adjustment of antibiotics and also following for resumption of anticoagulation for hx A.fib. Cardiology consulted today and recommended resuming anticoagulation but the patient refused Coumadin. It appeared that Cardiology was not aware that the patient was on Xarelto prior to admission.  I contacted Dr. Eldridge Dace who gave me a verbal order for Xarelto 15mg  daily. Reduced dose chosen d/t elevated SCr ~2 and CrCl 74ml/min.  Charolotte Eke, PharmD, pager [phone]. 03/05/2012,1:04 PM.

## 2012-03-05 NOTE — Progress Notes (Signed)
Physical Therapy Treatment Patient Details Name: Tina [name] MRN: [id number] DOB: 1941-03-28 Today's Date: 03/05/2012 Time: 4098-1191 PT Time Calculation (min): 39 min  PT Assessment / Plan / Recommendation Comments on Treatment Session  pt underwent cystoscopy 10/24 and has limited moiblity since then.  Today she is found to be incontinent of urine and needed much encouragement for mobility.  She needed increased assist today and will benefit from PT at SNF level to achieve maximal functional independence    Follow Up Recommendations  Post acute inpatient     Does the patient have the potential to tolerate intense rehabilitation  No, Recommend SNF  Barriers to Discharge        Equipment Recommendations       Recommendations for Other Services OT consult  Frequency Min 3X/week   Plan Discharge plan needs to be updated;Frequency remains appropriate    Precautions / Restrictions Restrictions Weight Bearing Restrictions: Yes LLE Weight Bearing: Weight bearing as tolerated   Pertinent Vitals/Pain Pt initially c/o pain med, but had no c/o once we started moving    Mobility  Bed Mobility Bed Mobility: Supine to Sit;Sitting - Scoot to Edge of Bed Supine to Sit: 1: +2 Total assist;HOB elevated;With rails Supine to Sit: Patient Percentage: 30% Transfers Transfers: Sit to Stand;Stand to Sit Sit to Stand: 1: +2 Total assist;From elevated surface;With upper extremity assist;From bed Sit to Stand: Patient Percentage: 50% (needs extra time) Stand to Sit: 1: +2 Total assist;With upper extremity assist;With armrests;To chair/3-in-1 (needs extra time) Stand to Sit: Patient Percentage: 50% Ambulation/Gait Ambulation/Gait Assistance: 1: +2 Total assist Ambulation/Gait: Patient Percentage: 40% Ambulation Distance (Feet): 3 Feet Assistive device: Rolling walker Gait Pattern: Step-to pattern;Decreased stride length;Trunk flexed;Decreased step length - right;Decreased step length -  left;Decreased weight shift to right;Decreased weight shift to left Gait velocity: decreased.  Pt needs extra tme General Gait Details: pt needs extra time and much encouragement.  She has difficutlty with weight shifting and lifting leg to step. She took several smal steps til she got in front of chair and then sat down suddenly onto chair Stairs: No Wheelchair Mobility Wheelchair Mobility: No    Exercises General Exercises - Lower Extremity Ankle Circles/Pumps: AROM;Both;10 reps;Supine Short Arc Quad: Other (comment) (pt c/o pain with attempted knee flexion in supine) Long Arc Quad: AROM;5 reps;Both;Seated Hip ABduction/ADduction: AAROM;Both;10 reps;Supine Straight Leg Raises: AAROM;Both;10 reps;Supine Hip Flexion/Marching: Supine;AAROM;Other (comment) (limited by decreased ability to bend knee)   PT Diagnosis:    PT Problem List:   PT Treatment Interventions:     PT Goals Acute Rehab PT Goals PT Goal Formulation: With patient Time For Goal Achievement: 03/09/12 Potential to Achieve Goals: Good Pt will go Supine/Side to Sit: with modified independence PT Goal: Supine/Side to Sit - Progress: Not progressing Pt will go Sit to Stand: with modified independence PT Goal: Sit to Stand - Progress: Not progressing Pt will Transfer Bed to Chair/Chair to Bed: with modified independence PT Transfer Goal: Bed to Chair/Chair to Bed - Progress: Not progressing Pt will Ambulate: 16 - 50 feet;with modified independence;with least restrictive assistive device PT Goal: Ambulate - Progress: Not progressing  Visit Information  Last PT Received On: 03/05/12 Assistance Needed: +2    Subjective Data  Subjective: "how long were you in the hospital after your surgery?" Patient Stated Goal: none stated   Cognition  Overall Cognitive Status: Impaired Area of Impairment: Following commands;Safety/judgement;Awareness of errors;Awareness of deficits;Problem solving Arousal/Alertness:  Awake/alert Orientation Level: Appears intact for tasks assessed;Disoriented X4  Behavior During Session: [facility] for tasks performed Memory Deficits: prior to treatment, pt almost refused PT because she said she had pain, but she did not c/o pain at all once she started moving Following Commands: Follows one step commands with increased time;Follows one step commands inconsistently Safety/Judgement: Decreased awareness of safety precautions;Decreased safety judgement for tasks assessed;Decreased awareness of need for assistance Awareness of Errors: Assistance required to identify errors made;Assistance required to correct errors made Awareness of Deficits: Pt is adamant about returning home, however would greatly benefit from rehab stay to increase strength and safety.     Balance  Balance Balance Assessed: Yes Static Sitting Balance Static Sitting - Balance Support: No upper extremity supported;Feet supported Static Sitting - Level of Assistance: 5: Stand by assistance Static Sitting - Comment/# of Minutes: pt able to sit on EOB for several minutes to wash back and change gown  End of Session PT - End of Session Equipment Utilized During Treatment: Oxygen Activity Tolerance: Patient limited by fatigue;Patient limited by pain Patient left: in chair (maximove pad in place)   GP     Rosey Bath K. [place], [place] [phone] 03/05/2012, 3:31 PM

## 2012-03-05 NOTE — Progress Notes (Signed)
Patient is becoming increasingly confused. I asked her if she was comfortable and she said "yes." Then I asked her, her pain level and she said a 10/10. As RN was talking to patient, she started to doze off. Patient is resting comfortably, RN will continue to monitor HR/BP and pain level.

## 2012-03-05 NOTE — Op Note (Signed)
NAMESRUSHTI, BERMAN NO.:  [id number]  MEDICAL RECORD NO.:  [id number]  LOCATION:  1415                         FACILITY:  [facility]  PHYSICIAN:  Excell Seltzer. Annabell Howells, M.D.    DATE OF BIRTH:  1940/12/17  DATE OF PROCEDURE:  03/04/2012 DATE OF DISCHARGE:                              OPERATIVE REPORT   PROCEDURE:  Cystoscopy with left ureteroscopic stone extraction.  PREOPERATIVE DIAGNOSIS:  Left proximal ureteral and renal stones.  POSTOPERATIVE DIAGNOSIS:  Left renal stone.  SURGEON:  Excell Seltzer. Annabell Howells, MD  ANESTHESIA:  General.  SPECIMEN:  Small stone.  DRAINS:  None.  COMPLICATIONS:  None.  INDICATIONS:  Ms. Tina [name] is a 71 year old white female, who was admitted last Friday with an obstructing left ureteral stone with urosepsis.  She underwent placement of both upper and lower pole left ureteral stents.  At the time of her initial procedure, about a 5 mm stone was found in the bladder.  The CT scan had shown a 4 mm proximal stone at the UPJ and a 6 mm lower pole stone.  She returns today for ureteroscopy findings and procedure.  She had been on Zosyn. She was taken to the operating room.  She was placed in lithotomy position and MAC sedation was induced.  She had PAS hose.  Her perineum and genitalia were prepped with Betadine solution.  She was draped in usual sterile fashion.  Cystoscopy was performed using the 22-French scope and a 12- degree lens.  A grasping forceps was used to grasp distal ends of the stents.  They pulled the urethral meatus where guide wires were passed up both stents to the kidney and the stents were removed.  A 38 cm digital access sheath was placed over 1 of the 2 wires.  The core and wire was then removed once the access sheath was in good position.  The digital flexible ureteroscope was then passed without difficulty to the kidney.  Initially the upper pole of her duplicated system was inspected.  Approximately 2 mm stone was  identified.  This was grasped and removed with the basket.  No other stones were identified on the thorough inspection.  It was aided with contrast.  I then inspected the lower pole collecting system.  This branched off the UPJ with a relatively narrow infundibulum but I was able to get the scope through it.  Once in the lower pole of moiety, she was noted to have some purulent material.  This was irrigated out and thorough inspection of all of the calices using contrast to aid identification revealed no stones within the collecting system.  There were some submucosal stones.  At this point once a thorough inspection being completed in the upper pole, then flushed of all purulent debris.  The scope was removed. Repeat inspection of the upper collecting system was performed once again with no stones identified.  I then backed up the scope and visually inspected the entire ureter.  No other stones were seen.  At this point, the guidewire was removed and a cystoscope was used to reinspect the bladder to ensure no stones were in the bladder, none were found.  The bladder was drained.  The patient was  taken down from lithotomy position.  Her anesthetic was reversed.  She was moved to recovery in stable condition.  There were no complications.     Excell Seltzer. Annabell Howells, M.D.     JJW/MEDQ  D:  03/04/2012  T:  03/05/2012  Job:  [id number]

## 2012-03-05 NOTE — Progress Notes (Signed)
Patient h/r continues to stay above 120. Called cardiologist; no new orders given as pt is asymptomatic and cardizem drip at max. I will continue to monitor pt for changes in status. I asked RR nurse to come visit pt. Verified that pt is stable, albeit lethargic, and to continue to monitor her for any changes in her condition.

## 2012-03-06 LAB — GLUCOSE, CAPILLARY
Glucose-Capillary: 218 mg/dL — ABNORMAL HIGH (ref 70–99)
Glucose-Capillary: 185 mg/dL — ABNORMAL HIGH (ref 70–99)
Glucose-Capillary: 123 mg/dL — ABNORMAL HIGH (ref 70–99)
Glucose-Capillary: 170 mg/dL — ABNORMAL HIGH (ref 70–99)

## 2012-03-06 MED ORDER — METOPROLOL TARTRATE 50 MG PO TABS
50.0000 mg | ORAL_TABLET | Freq: Two times a day (BID) | ORAL | Status: DC
  Filled 2012-03-06 (×4): qty 1

## 2012-03-06 MED ORDER — RIVAROXABAN 15 MG PO TABS
15.0000 mg | ORAL_TABLET | Freq: Every day | ORAL | Status: DC
  Administered 2012-03-06 – 2012-03-09 (×4): 15 mg via ORAL
  Filled 2012-03-06 (×6): qty 1

## 2012-03-06 NOTE — Progress Notes (Signed)
SUBJECTIVE:  No complaints this am.  HR much better controlled  OBJECTIVE:   Vitals:   Filed Vitals:   03/05/12 2200 03/05/12 2304 03/05/12 2309 03/06/12 0500  BP: 95/65 120/56 114/71 109/58  Pulse: 140 103 98 44  Temp: 99.1 F (37.3 C)   99.9 F (37.7 C)  TempSrc: Oral   Oral  Resp: 16   22  Height:      Weight:      SpO2: 97%   91%   I&O's:   Intake/Output Summary (Last 24 hours) at 03/06/12 0816 Last data filed at 03/05/12 1500  Gross per 24 hour  Intake  181.5 ml  Output      0 ml  Net  181.5 ml   TELEMETRY: Reviewed telemetry pt in atrial flutter with CVR:     PHYSICAL EXAM General: Well developed, well nourished, in no acute distress Head: Eyes PERRLA, No xanthomas.   Normal cephalic and atramatic  Lungs:   Clear bilaterally to auscultation and percussion. Heart:   HRRR S1 S2 Pulses are 2+ & equal. Abdomen: Bowel sounds are positive, abdomen soft and non-tender without masses  Extremities:   No clubbing, cyanosis or edema.  DP +1 Neuro: Alert and oriented X 3.    LABS: Basic Metabolic Panel:  Basename 03/05/12 0446 03/04/12 0459  NA 136 142  K 3.9 3.6  CL 103 107  CO2 22 22  GLUCOSE 108* 93  BUN 27* 29*  CREATININE 2.00* 2.01*  CALCIUM 8.6 8.3*  MG -- --  PHOS -- --   Liver Function Tests: No results found for this basename: AST:2,ALT:2,ALKPHOS:2,BILITOT:2,PROT:2,ALBUMIN:2 in the last 72 hours No results found for this basename: LIPASE:2,AMYLASE:2 in the last 72 hours CBC:  Basename 03/05/12 0446  WBC 13.7*  NEUTROABS --  HGB 9.8*  HCT 30.0*  MCV 83.8  PLT 275   Coag Panel:   Lab Results  Component Value Date   INR 1.04 02/27/2012   INR 1.08 02/06/2012   INR 1.23 02/01/2012    RADIOLOGY: Ct Abdomen Pelvis W Contrast  02/27/2012  *RADIOLOGY REPORT*  Clinical Data: Lower abdominal pain and guarding.  Nausea and vomiting.  CT ABDOMEN AND PELVIS WITH CONTRAST  Technique:  Multidetector CT imaging of the abdomen and pelvis was performed  following the standard protocol during bolus administration of intravenous contrast.  Contrast: 75mL OMNIPAQUE IOHEXOL 300 MG/ML  SOLN  Comparison: 02/01/2012  Findings: Moderate left hydronephrosis and left perinephric stranding is seen.  A 4 mm calculus is seen at the left ureteropelvic junction.  No other ureteral calculi or dilatation seen.  A 6 mm nonobstructing calculus is again seen in the lower pole of the left kidney.  The right kidney is normal in appearance.  Foley catheter is seen within the urinary bladder which is empty. Prior hysterectomy noted.  Adnexae are unremarkable.  Tiny calcified gallstones are seen, however there is no evidence of acute cholecystitis.  The liver, spleen, pancreas, and adrenal glands are normal appearance.  There is no evidence of lymphadenopathy.  No evidence of dilated bowel loops or hernia.  IMPRESSION:  1.  Moderate left hydronephrosis and perinephric stranding due to 4 mm calculus at the left ureteral pelvic junction. 2.  6 mm nonobstructing calculus lower pole left kidney. 3.  Cholelithiasis, without evidence of cholecystitis.   Original Report Authenticated By: Danae Orleans, M.D.    US Renal  03/02/2012  *RADIOLOGY REPORT*  Clinical Data: Renal failure.  RENAL/URINARY TRACT ULTRASOUND COMPLETE  Comparison:  Abdominal CT 02/27/2012  Findings:  Right Kidney:  Right kidney measures 9.5 cm in length.  The lower pole is partially obscured by bowel gas.  No evidence for right hydronephrosis. There is a slightly hypoechoic structure near the renal hilum that measures up to 1.7 cm.  This most likely represents a parapelvic cyst based on the previous CT study.  Left Kidney:  Left kidney measures 11.2 cm in length without hydronephrosis.  Hypoechoic structure in the mid pole measures up to 1.4 cm and probably represents a cystic structure.  There is a 5 mm echogenic structure with acoustic shadowing in the lower pole. Findings are consistent with a stone.  There is also  mildly hypoechoic structure in the lower pole that measures up to 1.9 cm.  Bladder: Small amount of fluid in the urinary bladder.  There appears to be a ureteral stent within the bladder.  There is concern for posterior bladder wall thickening or debris.  IMPRESSION: No evidence for hydronephrosis.  There are heterogeneous hypoechoic structures in both kidneys that most likely represent parapelvic cysts.  5 mm left kidney stone.  Debris or posterior wall thickening in the bladder.   Original Report Authenticated By: Richarda Overlie, M.D.    Dg Chest Port 1 View  02/29/2012  *RADIOLOGY REPORT*  Clinical Data: CHF, shortness of breath, weakness  PORTABLE CHEST - 1 VIEW  Comparison: 02/01/2012; 07/06/2011  Findings:  Grossly unchanged enlarged cardiac silhouette and mediastinal contours.  Pulmonary vasculature is indistinct with cephalization of flow.  Grossly unchanged perihilar and bibasilar heterogeneous opacities.  No definite pleural effusion or pneumothorax. Unchanged bones.  IMPRESSION: 1.  Findings compatible with pulmonary edema.  2.  Bibasilar and perihilar opacities favored to represent atelectasis, though underlying infection not excluded.  Continued attention on follow-up is recommended.   Original Report Authenticated By: Waynard Reeds, M.D.    Dg Abd Acute W/chest  02/27/2012  *RADIOLOGY REPORT*  Clinical Data: Abdominal pain.  ACUTE ABDOMEN SERIES (ABDOMEN 2 VIEW & CHEST 1 VIEW)  Comparison: 02/01/2012  Findings: Moderate stool burden throughout the colon. There is normal bowel gas pattern.  No free air.  No organomegaly or suspicious calcification.  No acute bony abnormality.  Mild cardiomegaly and vascular congestion.  Low lung volumes.  No confluent opacities or effusions.  No acute bony abnormality.  IMPRESSION: Moderate stool burden.  No bowel obstruction or free air.  Cardiomegaly, vascular congestion.   Original Report Authenticated By: Cyndie Chime, M.D.       ASSESSMENT:  1.  Atrial  flutter with RVR now rate controlled on CCB and BB 2.  Systemic anticoagulation with Xarelto 3.  CKD stage 3  PLAN:   1.  Increase Metoprolol to 50mg  BID 2.  Wean off Cardizem gtt  Quintella Reichert, MD  03/06/2012  8:16 AM

## 2012-03-06 NOTE — Progress Notes (Signed)
2 Days Post-Op Subjective: Patient reports no complaints.   Objective: Vital signs in last 24 hours: Temp:  [99 F (37.2 C)-99.9 F (37.7 C)] 99.9 F (37.7 C) (10/26 0500) Pulse Rate:  [44-140] 44  (10/26 0500) Resp:  [16-22] 22  (10/26 0500) BP: (95-120)/(56-71) 109/58 mmHg (10/26 0500) SpO2:  [91 %-97 %] 91 % (10/26 0500)  Intake/Output from previous day: 10/25 0701 - 10/26 0700 In: 281.5 [I.V.:131.5; IV Piggyback:150] Out: -  Intake/Output this shift:    Physical Exam:  Gen: NAD, sitting up in bed eating breakfast A&O No focal deficits Abd - soft, NT No CVAT   Lab Results:  [facility] 03/05/12 0446  HGB 9.8*  HCT 30.0*   BMET  Basename 03/05/12 0446 03/04/12 0459  NA 136 142  K 3.9 3.6  CL 103 107  CO2 22 22  GLUCOSE 108* 93  BUN 27* 29*  CREATININE 2.00* 2.01*  CALCIUM 8.6 8.3*   No results found for this basename: LABPT:3,INR:3 in the last 72 hours No results found for this basename: LABURIN:1 in the last 72 hours Results for orders placed during the hospital encounter of 02/27/12  URINE CULTURE     Status: Normal   Collection Time   02/27/12 10:36 AM      Component Value Range Status Comment   Specimen Description URINE, CLEAN CATCH   Final    Special Requests NONE   Final    Culture  Setup Time 02/27/2012 11:46   Final    Colony Count >=100,000 COLONIES/ML   Final    Culture ENTEROBACTER CLOACAE   Final    Report Status 02/29/2012 FINAL   Final    Organism ID, Bacteria ENTEROBACTER CLOACAE   Final   URINE CULTURE     Status: Normal   Collection Time   02/27/12 11:43 AM      Component Value Range Status Comment   Specimen Description URINE, CATHETERIZED   Final    Special Requests ADDED 02/27/12 1415   Final    Culture  Setup Time 02/27/2012 14:44   Final    Colony Count >=100,000 COLONIES/ML   Final    Culture ENTEROBACTER CLOACAE   Final    Report Status 02/29/2012 FINAL   Final    Organism ID, Bacteria ENTEROBACTER CLOACAE   Final     CULTURE, BLOOD (ROUTINE X 2)     Status: Normal   Collection Time   02/27/12  3:15 PM      Component Value Range Status Comment   Specimen Description BLOOD FOREARM RIGHT   Final    Special Requests BOTTLES DRAWN AEROBIC ONLY 3CC PT ON ROCEPHIN   Final    Culture  Setup Time 02/27/2012 21:42   Final    Culture NO GROWTH 5 DAYS   Final    Report Status 03/04/2012 FINAL   Final   CULTURE, BLOOD (ROUTINE X 2)     Status: Normal   Collection Time   02/27/12  3:23 PM      Component Value Range Status Comment   Specimen Description BLOOD HAND RIGHT   Final    Special Requests BOTTLES DRAWN AEROBIC ONLY 10CC PT ON ROCEPHIN   Final    Culture  Setup Time 02/27/2012 21:42   Final    Culture NO GROWTH 5 DAYS   Final    Report Status 03/04/2012 FINAL   Final   STONE ANALYSIS     Status: Normal   Collection Time  02/27/12  6:08 PM      Component Value Range Status Comment   Nidus Not observed   Final    Component 1 KSTONE See Below   Final    Stone Weight KSTONE 0.0210   Final   URINE CULTURE     Status: Normal   Collection Time   02/27/12  6:59 PM      Component Value Range Status Comment   Specimen Description URINE, RANDOM FROM LEFT LOWER POLE   Final    Special Requests NONE   Final    Culture  Setup Time 02/28/2012 02:17   Final    Colony Count >=100,000 COLONIES/ML   Final    Culture ENTEROBACTER CLOACAE   Final    Report Status 03/01/2012 FINAL   Final    Organism ID, Bacteria ENTEROBACTER CLOACAE   Final   CLOSTRIDIUM DIFFICILE BY PCR     Status: Normal   Collection Time   03/05/12 12:30 AM      Component Value Range Status Comment   C difficile by pcr NEGATIVE  NEGATIVE Final     Studies/Results: No results found.  Assessment/Plan: S/p left ureteroscopy Enterobacter Pyelo - on IV Zosyn - need IV abx through 10/28 Planning d/c to SNF   LOS: 8 days   Antony Haste 03/06/2012, 9:34 AM

## 2012-03-06 NOTE — Progress Notes (Signed)
[facility]             Progress Note   Subjective: No complaints.   Objective: Vital signs in last 24 hours: Temp:  [99 F (37.2 C)-99.9 F (37.7 C)] 99.9 F (37.7 C) (10/26 0500) Pulse Rate:  [44-140] 73  (10/26 1124) Resp:  [16-22] 22  (10/26 0500) BP: (95-120)/(51-71) 100/51 mmHg (10/26 1124) SpO2:  [91 %-97 %] 91 % (10/26 0500) Weight change:  Last BM Date: 03/04/12  Intake/Output from previous day: 10/25 0701 - 10/26 0700 In: 281.5 [I.V.:131.5; IV Piggyback:150] Out: -  Total I/O In: 360 [P.O.:360] Out: -    Physical Exam: Alert, oriented, obese, [place]/AT, wears corrective lenses, RRR, CTA B, 1+ edema bilaterally, soft, nondistended, Pos bowel sounds.   Lab Results: Basic Metabolic Panel:  Basename 03/05/12 0446 03/04/12 0459  NA 136 142  K 3.9 3.6  CL 103 107  CO2 22 22  GLUCOSE 108* 93  BUN 27* 29*  CREATININE 2.00* 2.01*  CALCIUM 8.6 8.3*  MG -- --  PHOS -- --   CBC:  Basename 03/05/12 0446  WBC 13.7*  NEUTROABS --  HGB 9.8*  HCT 30.0*  MCV 83.8  PLT 275   CBG:  Basename 03/06/12 1154 03/06/12 0749 03/05/12 2156 03/05/12 1713 03/05/12 1227 03/05/12 0738  GLUCAP 148* 123* 139* 336* 161* 90    Recent Results (from the past 240 hour(s))  URINE CULTURE     Status: Normal   Collection Time   02/27/12 10:36 AM      Component Value Range Status Comment   Specimen Description URINE, CLEAN CATCH   Final    Special Requests NONE   Final    Culture  Setup Time 02/27/2012 11:46   Final    Colony Count >=100,000 COLONIES/ML   Final    Culture ENTEROBACTER CLOACAE   Final    Report Status 02/29/2012 FINAL   Final    Organism ID, Bacteria ENTEROBACTER CLOACAE   Final   URINE CULTURE     Status: Normal   Collection Time   02/27/12 11:43 AM      Component Value Range Status Comment   Specimen Description URINE, CATHETERIZED   Final    Special Requests ADDED 02/27/12 1415   Final    Culture  Setup Time 02/27/2012 14:44   Final    Colony Count >=100,000 COLONIES/ML   Final    Culture ENTEROBACTER CLOACAE   Final    Report Status 02/29/2012 FINAL   Final    Organism ID, Bacteria ENTEROBACTER CLOACAE   Final   CULTURE, BLOOD (ROUTINE X 2)     Status: Normal   Collection Time   02/27/12  3:15 PM      Component Value Range Status Comment   Specimen Description BLOOD FOREARM RIGHT   Final    Special Requests BOTTLES DRAWN AEROBIC ONLY 3CC PT ON ROCEPHIN   Final    Culture  Setup Time 02/27/2012 21:42   Final    Culture NO GROWTH 5 DAYS   Final    Report Status 03/04/2012 FINAL   Final   CULTURE, BLOOD (ROUTINE X 2)     Status: Normal   Collection Time   02/27/12  3:23 PM      Component Value Range Status Comment   Specimen Description BLOOD HAND RIGHT   Final    Special Requests BOTTLES DRAWN AEROBIC ONLY 10CC PT ON ROCEPHIN   Final    Culture  Setup  Time 02/27/2012 21:42   Final    Culture NO GROWTH 5 DAYS   Final    Report Status 03/04/2012 FINAL   Final   STONE ANALYSIS     Status: Normal   Collection Time   02/27/12  6:08 PM      Component Value Range Status Comment   Nidus Not observed   Final    Component 1 KSTONE See Below   Final    Stone Weight KSTONE 0.0210   Final   URINE CULTURE     Status: Normal   Collection Time   02/27/12  6:59 PM      Component Value Range Status Comment   Specimen Description URINE, RANDOM FROM LEFT LOWER POLE   Final    Special Requests NONE   Final    Culture  Setup Time 02/28/2012 02:17   Final    Colony Count >=100,000 COLONIES/ML   Final    Culture ENTEROBACTER CLOACAE   Final    Report Status 03/01/2012 FINAL   Final    Organism ID, Bacteria ENTEROBACTER CLOACAE   Final   CLOSTRIDIUM DIFFICILE BY PCR     Status: Normal   Collection Time   03/05/12 12:30 AM      Component Value Range Status Comment   C difficile by pcr NEGATIVE  NEGATIVE Final     Studies/Results: No results found.  Medications: Scheduled Meds:    . ALPRAZolam  0.25 mg Oral Daily  .  cholecalciferol  1,000 Units Oral Daily  . citalopram  20 mg Oral Daily  . docusate sodium  100 mg Oral BID  . feeding supplement  237 mL Oral BID BM  . insulin aspart  0-15 Units Subcutaneous TID WC  . insulin glargine  24 Units Subcutaneous QHS  . metoprolol tartrate  50 mg Oral BID  . multivitamin with minerals  1 tablet Oral Daily  . piperacillin-tazobactam (ZOSYN)  IV  3.375 g Intravenous Q8H  . pregabalin  75 mg Oral BID  . rivaroxaban  15 mg Oral Q supper  . DISCONTD: metoprolol tartrate  25 mg Oral BID  . DISCONTD: rivaroxaban  15 mg Oral Daily   Continuous Infusions:    . diltiazem (CARDIZEM) infusion 10 mg/hr (03/06/12 1121)   PRN Meds:.acetaminophen, bisacodyl, diclofenac sodium, fentaNYL, hyoscyamine, meclizine, ondansetron, traMADol, zolpidem  Assessment/Plan:  Active Problems:  DM (diabetes mellitus) with complications  Pyelonephritis  Chronic cystitis  Hydronephrosis  Urge incontinence  Fecal incontinence   Hydronephrosis/Left obstructing ureteropelvic stone with Pyelonephritis -Had ureteroscopy/stone extraction 10/24by Dr. Annabell Howells. -Most recent US shows no hydronephrosis. -Continue Zosyn. -Urine cx with enterobacter only sensitive to IV meds: would recommend a total of 10 days of treatment.  Acute on CKD Stage III -Cr is improving. -Suspect acute worsening 2/2 #1.  DM -Fair control. -No medication changes.  Mild Thrombocytopenia -Resolved.  AOCD -Hb stable. -No need for transfusion unless Hb <7.  A Fib/Flutter -Being managed by [facility] cardiology. -Metoprolol is being increased today and cardizem drip is being weaned.  Disposition -PT recs SNF. -Patient is now agreeable to proceed. -Will sign off as only active medical issue is her a flutter that cardiology is currently managing. -Please call us back with questions.   Time spent coordinating care: 20 minutes.   LOS: 8 days   [facility] Pager:  [phone] 03/06/2012, 1:38 PM

## 2012-03-07 LAB — GLUCOSE, CAPILLARY
Glucose-Capillary: 149 mg/dL — ABNORMAL HIGH (ref 70–99)
Glucose-Capillary: 235 mg/dL — ABNORMAL HIGH (ref 70–99)

## 2012-03-07 MED ORDER — METOPROLOL TARTRATE 50 MG PO TABS: 75.0000 mg | ORAL_TABLET | Freq: Two times a day (BID) | ORAL | Status: DC

## 2012-03-07 MED ORDER — METOPROLOL TARTRATE 50 MG PO TABS: 50.0000 mg | ORAL_TABLET | Freq: Two times a day (BID) | ORAL | Status: DC

## 2012-03-07 MED ORDER — DILTIAZEM HCL 100 MG IV SOLR
5.0000 mg/h | INTRAVENOUS | Status: DC
  Administered 2012-03-07 – 2012-03-08 (×2): 10 mg/h via INTRAVENOUS
  Filled 2012-03-07 (×2): qty 100

## 2012-03-07 MED ORDER — METOPROLOL TARTRATE 25 MG PO TABS
25.0000 mg | ORAL_TABLET | Freq: Two times a day (BID) | ORAL | Status: DC
  Administered 2012-03-07 – 2012-03-10 (×7): 25 mg via ORAL
  Filled 2012-03-07 (×8): qty 1

## 2012-03-07 NOTE — Progress Notes (Signed)
3 Days Post-Op Subjective: Patient feels "pretty good" but just "weak".   Objective: Vital signs in last 24 hours: Temp:  [98.6 F (37 C)-99.5 F (37.5 C)] 98.7 F (37.1 C) (10/27 0418) Pulse Rate:  [68-131] 131  (10/27 0932) Resp:  [20] 20  (10/27 0418) BP: (100-119)/(46-71) 119/71 mmHg (10/27 0932) SpO2:  [94 %-95 %] 95 % (10/27 0418) Weight:  [111.5 kg (245 lb 13 oz)] 111.5 kg (245 lb 13 oz) (10/27 0500)  Intake/Output from previous day: 10/26 0701 - 10/27 0700 In: 740 [P.O.:640; IV Piggyback:100] Out: -  Intake/Output this shift:    Physical Exam:  NAD Watching TV A&Ox3 Abd - soft, NT  Lab Results:  [facility] 03/05/12 0446  HGB 9.8*  HCT 30.0*   BMET  Basename 03/05/12 0446  NA 136  K 3.9  CL 103  CO2 22  GLUCOSE 108*  BUN 27*  CREATININE 2.00*  CALCIUM 8.6   No results found for this basename: LABPT:3,INR:3 in the last 72 hours No results found for this basename: LABURIN:1 in the last 72 hours Results for orders placed during the hospital encounter of 02/27/12  URINE CULTURE     Status: Normal   Collection Time   02/27/12 10:36 AM      Component Value Range Status Comment   Specimen Description URINE, CLEAN CATCH   Final    Special Requests NONE   Final    Culture  Setup Time 02/27/2012 11:46   Final    Colony Count >=100,000 COLONIES/ML   Final    Culture ENTEROBACTER CLOACAE   Final    Report Status 02/29/2012 FINAL   Final    Organism ID, Bacteria ENTEROBACTER CLOACAE   Final   URINE CULTURE     Status: Normal   Collection Time   02/27/12 11:43 AM      Component Value Range Status Comment   Specimen Description URINE, CATHETERIZED   Final    Special Requests ADDED 02/27/12 1415   Final    Culture  Setup Time 02/27/2012 14:44   Final    Colony Count >=100,000 COLONIES/ML   Final    Culture ENTEROBACTER CLOACAE   Final    Report Status 02/29/2012 FINAL   Final    Organism ID, Bacteria ENTEROBACTER CLOACAE   Final   CULTURE, BLOOD (ROUTINE X  2)     Status: Normal   Collection Time   02/27/12  3:15 PM      Component Value Range Status Comment   Specimen Description BLOOD FOREARM RIGHT   Final    Special Requests BOTTLES DRAWN AEROBIC ONLY 3CC PT ON ROCEPHIN   Final    Culture  Setup Time 02/27/2012 21:42   Final    Culture NO GROWTH 5 DAYS   Final    Report Status 03/04/2012 FINAL   Final   CULTURE, BLOOD (ROUTINE X 2)     Status: Normal   Collection Time   02/27/12  3:23 PM      Component Value Range Status Comment   Specimen Description BLOOD HAND RIGHT   Final    Special Requests BOTTLES DRAWN AEROBIC ONLY 10CC PT ON ROCEPHIN   Final    Culture  Setup Time 02/27/2012 21:42   Final    Culture NO GROWTH 5 DAYS   Final    Report Status 03/04/2012 FINAL   Final   STONE ANALYSIS     Status: Normal   Collection Time   02/27/12  6:08  PM      Component Value Range Status Comment   Nidus Not observed   Final    Component 1 KSTONE See Below   Final    Stone Weight KSTONE 0.0210   Final   URINE CULTURE     Status: Normal   Collection Time   02/27/12  6:59 PM      Component Value Range Status Comment   Specimen Description URINE, RANDOM FROM LEFT LOWER POLE   Final    Special Requests NONE   Final    Culture  Setup Time 02/28/2012 02:17   Final    Colony Count >=100,000 COLONIES/ML   Final    Culture ENTEROBACTER CLOACAE   Final    Report Status 03/01/2012 FINAL   Final    Organism ID, Bacteria ENTEROBACTER CLOACAE   Final   CLOSTRIDIUM DIFFICILE BY PCR     Status: Normal   Collection Time   03/05/12 12:30 AM      Component Value Range Status Comment   C difficile by pcr NEGATIVE  NEGATIVE Final     Studies/Results: No results found.  Assessment/Plan: S/p left URS On Day #7 Zosyn No fevers Cardiology working on rate control   LOS: 9 days   Antony Haste 03/07/2012, 10:00 AM

## 2012-03-07 NOTE — Progress Notes (Signed)
ANTIBIOTIC CONSULT NOTE - Follow Up  Pharmacy Consult for Zosyn Indication: Left Pyelonephritis  Patient Measurements: Height: 5\' 5"  (165.1 cm) Weight: 245 lb 13 oz (111.5 kg) IBW/kg (Calculated) : 57   Labs:  Basename 03/05/12 0446  WBC 13.7*  HGB 9.8*  PLT 275  LABCREA --  CREATININE 2.00*   Estimated Creatinine Clearance: 32.6 ml/min (by C-G formula based on Cr of 2).  Microbiology: 10/18 urine x 3: 3/3 E. cloaecae sens zosyn, cefepime, ceftaz, imipenem 10/18 Blood x 2: neg 10/18 C.diff PCR: neg  Assessment:  71 yo F s/p cyctoscopy w/ retrograde pyelogram/ureteral stent placement on 10/18.    Urine cultures x 3 grew Enterobacter cloacae.  Day #7 Zosyn.  Plan is for 10 day treatment per MD note.  CrCl>20 ml/min.  Plan:  Continue Zosyn 3.375g IV q8h (4 hour infusion) x 3 more days.   Clance Boll, PharmD, BCPS Pager: [phone] 03/07/2012 9:13 AM

## 2012-03-07 NOTE — Progress Notes (Addendum)
SUBJECTIVE:  Heart rate elevated today at 120bpm.  HR was controlled in the 60-70's until about 6am.    OBJECTIVE:   Vitals:   Filed Vitals:   03/06/12 2148 03/07/12 0257 03/07/12 0418 03/07/12 0500  BP: 103/46 105/62 115/54   Pulse: 68 69 75   Temp: 98.6 F (37 C) 99.5 F (37.5 C) 98.7 F (37.1 C)   TempSrc: Oral Oral Oral   Resp: 20 20 20    Height:      Weight:    111.5 kg (245 lb 13 oz)  SpO2: 94% 94% 95%    I&O's:   Intake/Output Summary (Last 24 hours) at 03/07/12 0743 Last data filed at 03/06/12 2200  Gross per 24 hour  Intake    700 ml  Output      0 ml  Net    700 ml   TELEMETRY: Reviewed telemetry pt in atrial flutter with RVR     PHYSICAL EXAM General: Well developed, well nourished, in no acute distress Head: Eyes PERRLA, No xanthomas.   Normal cephalic and atramatic  Lungs:   Clear bilaterally to auscultation and percussion. Heart:   Irregularly irregular S1 S2 Pulses are 2+ & equal. Abdomen: Bowel sounds are positive, abdomen soft and non-tender without masses  Extremities:   No clubbing, cyanosis or edema.  DP +1 Neuro: Alert and oriented X 3. Psych:  Good affect, responds appropriately   LABS: Basic Metabolic Panel:  Basename 03/05/12 0446  NA 136  K 3.9  CL 103  CO2 22  GLUCOSE 108*  BUN 27*  CREATININE 2.00*  CALCIUM 8.6  MG --  PHOS --   Liver Function Tests: No results found for this basename: AST:2,ALT:2,ALKPHOS:2,BILITOT:2,PROT:2,ALBUMIN:2 in the last 72 hours No results found for this basename: LIPASE:2,AMYLASE:2 in the last 72 hours CBC:  Basename 03/05/12 0446  WBC 13.7*  NEUTROABS --  HGB 9.8*  HCT 30.0*  MCV 83.8  PLT 275   Coag Panel:   Lab Results  Component Value Date   INR 1.04 02/27/2012   INR 1.08 02/06/2012   INR 1.23 02/01/2012    RADIOLOGY: Ct Abdomen Pelvis W Contrast  02/27/2012  *RADIOLOGY REPORT*  Clinical Data: Lower abdominal pain and guarding.  Nausea and vomiting.  CT ABDOMEN AND PELVIS WITH  CONTRAST  Technique:  Multidetector CT imaging of the abdomen and pelvis was performed following the standard protocol during bolus administration of intravenous contrast.  Contrast: 75mL OMNIPAQUE IOHEXOL 300 MG/ML  SOLN  Comparison: 02/01/2012  Findings: Moderate left hydronephrosis and left perinephric stranding is seen.  A 4 mm calculus is seen at the left ureteropelvic junction.  No other ureteral calculi or dilatation seen.  A 6 mm nonobstructing calculus is again seen in the lower pole of the left kidney.  The right kidney is normal in appearance.  Foley catheter is seen within the urinary bladder which is empty. Prior hysterectomy noted.  Adnexae are unremarkable.  Tiny calcified gallstones are seen, however there is no evidence of acute cholecystitis.  The liver, spleen, pancreas, and adrenal glands are normal appearance.  There is no evidence of lymphadenopathy.  No evidence of dilated bowel loops or hernia.  IMPRESSION:  1.  Moderate left hydronephrosis and perinephric stranding due to 4 mm calculus at the left ureteral pelvic junction. 2.  6 mm nonobstructing calculus lower pole left kidney. 3.  Cholelithiasis, without evidence of cholecystitis.   Original Report Authenticated By: Danae Orleans, M.D.    US Renal  03/02/2012  *RADIOLOGY REPORT*  Clinical Data: Renal failure.  RENAL/URINARY TRACT ULTRASOUND COMPLETE  Comparison:  Abdominal CT 02/27/2012  Findings:  Right Kidney:  Right kidney measures 9.5 cm in length.  The lower pole is partially obscured by bowel gas.  No evidence for right hydronephrosis. There is a slightly hypoechoic structure near the renal hilum that measures up to 1.7 cm.  This most likely represents a parapelvic cyst based on the previous CT study.  Left Kidney:  Left kidney measures 11.2 cm in length without hydronephrosis.  Hypoechoic structure in the mid pole measures up to 1.4 cm and probably represents a cystic structure.  There is a 5 mm echogenic structure with acoustic  shadowing in the lower pole. Findings are consistent with a stone.  There is also mildly hypoechoic structure in the lower pole that measures up to 1.9 cm.  Bladder: Small amount of fluid in the urinary bladder.  There appears to be a ureteral stent within the bladder.  There is concern for posterior bladder wall thickening or debris.  IMPRESSION: No evidence for hydronephrosis.  There are heterogeneous hypoechoic structures in both kidneys that most likely represent parapelvic cysts.  5 mm left kidney stone.  Debris or posterior wall thickening in the bladder.   Original Report Authenticated By: Richarda Overlie, M.D.    Dg Chest Port 1 View  02/29/2012  *RADIOLOGY REPORT*  Clinical Data: CHF, shortness of breath, weakness  PORTABLE CHEST - 1 VIEW  Comparison: 02/01/2012; 07/06/2011  Findings:  Grossly unchanged enlarged cardiac silhouette and mediastinal contours.  Pulmonary vasculature is indistinct with cephalization of flow.  Grossly unchanged perihilar and bibasilar heterogeneous opacities.  No definite pleural effusion or pneumothorax. Unchanged bones.  IMPRESSION: 1.  Findings compatible with pulmonary edema.  2.  Bibasilar and perihilar opacities favored to represent atelectasis, though underlying infection not excluded.  Continued attention on follow-up is recommended.   Original Report Authenticated By: Waynard Reeds, M.D.    Dg Abd Acute W/chest  02/27/2012  *RADIOLOGY REPORT*  Clinical Data: Abdominal pain.  ACUTE ABDOMEN SERIES (ABDOMEN 2 VIEW & CHEST 1 VIEW)  Comparison: 02/01/2012  Findings: Moderate stool burden throughout the colon. There is normal bowel gas pattern.  No free air.  No organomegaly or suspicious calcification.  No acute bony abnormality.  Mild cardiomegaly and vascular congestion.  Low lung volumes.  No confluent opacities or effusions.  No acute bony abnormality.  IMPRESSION: Moderate stool burden.  No bowel obstruction or free air.  Cardiomegaly, vascular congestion.   Original  Report Authenticated By: Cyndie Chime, M.D.       ASSESSMENT:  1. Atrial flutter with RVR - HR not well controlled this am 2. Systemic anticoagulation with Xarelto  3. CKD stage 3    PLAN:   1.  Continue Cardizem gtt for now and wean off as long as HR less than 110bpm 2.  Will go ahead and give metoprolol now but only give 25mg  BID since she did not get any beta blocker yesterday due to borderline low BP  Quintella Reichert, MD  03/07/2012  7:43 AM

## 2012-03-08 LAB — CREATININE, SERUM
Creatinine, Ser: 2.4 mg/dL — ABNORMAL HIGH (ref 0.50–1.10)
GFR calc Af Amer: 22 mL/min — ABNORMAL LOW (ref >90)

## 2012-03-08 LAB — GLUCOSE, CAPILLARY: Glucose-Capillary: 96 mg/dL (ref 70–99)

## 2012-03-08 MED ORDER — DILTIAZEM HCL 90 MG PO TABS
90.0000 mg | ORAL_TABLET | Freq: Four times a day (QID) | ORAL | Status: DC
  Administered 2012-03-08 (×3): 90 mg via ORAL
  Filled 2012-03-08 (×7): qty 1

## 2012-03-08 NOTE — Progress Notes (Addendum)
Patient started back on cardizem gtt at 10 heart rate sustaining at 128 . Will continue to monitor patient.

## 2012-03-08 NOTE — Progress Notes (Signed)
PT Cancellation Note  Patient Details Name: Tina [name] MRN: [id number] DOB: 15-Jul-1940   Cancelled Treatment:    Reason Eval/Treat Not Completed: Fatigue/lethargy limiting ability to participate.  Pt states that she does not feel well, but will get up in am with therapy.  Will check back.    Thanks,    Page, Meribeth Mattes 03/08/2012, 5:21 PM

## 2012-03-08 NOTE — Progress Notes (Signed)
SUBJECTIVE:  No complaints  OBJECTIVE:   Vitals:   Filed Vitals:   03/08/12 0441 03/08/12 0736 03/08/12 1100 03/08/12 1151  BP: 127/65  122/77 112/76  Pulse: 63   93  Temp: 100.8 F (38.2 C) 98.8 F (37.1 C)  99.1 F (37.3 C)  TempSrc: Axillary   Oral  Resp:      Height:      Weight: 114.7 kg (252 lb 13.9 oz)     SpO2: 96%   93%   I&O's:   Intake/Output Summary (Last 24 hours) at 03/08/12 1420 Last data filed at 03/08/12 1241  Gross per 24 hour  Intake   1053 ml  Output      0 ml  Net   1053 ml   TELEMETRY: Reviewed telemetry pt in atrial fibrillation     PHYSICAL EXAM General: Well developed, well nourished, in no acute distress Head: Eyes PERRLA, No xanthomas.   Normal cephalic and atramatic  Lungs:   Clear bilaterally to auscultation and percussion. Heart:   Irregularly irregular S1 S2 Pulses are 2+ & equal. Abdomen: Bowel sounds are positive, abdomen soft and non-tender without masses  Extremities:   No clubbing, cyanosis or edema.  DP +1 Neuro: Alert and oriented X 3. Psych:  Good affect, responds appropriately   LABS: Basic Metabolic Panel:  Basename 03/08/12 0445  NA --  K --  CL --  CO2 --  GLUCOSE --  BUN --  CREATININE 2.40*  CALCIUM --  MG --  PHOS --     Coag Panel:   Lab Results  Component Value Date   INR 1.04 02/27/2012   INR 1.08 02/06/2012   INR 1.23 02/01/2012    RADIOLOGY: Ct Abdomen Pelvis W Contrast  02/27/2012  *RADIOLOGY REPORT*  Clinical Data: Lower abdominal pain and guarding.  Nausea and vomiting.  CT ABDOMEN AND PELVIS WITH CONTRAST  Technique:  Multidetector CT imaging of the abdomen and pelvis was performed following the standard protocol during bolus administration of intravenous contrast.  Contrast: 75mL OMNIPAQUE IOHEXOL 300 MG/ML  SOLN  Comparison: 02/01/2012  Findings: Moderate left hydronephrosis and left perinephric stranding is seen.  A 4 mm calculus is seen at the left ureteropelvic junction.  No other ureteral  calculi or dilatation seen.  A 6 mm nonobstructing calculus is again seen in the lower pole of the left kidney.  The right kidney is normal in appearance.  Foley catheter is seen within the urinary bladder which is empty. Prior hysterectomy noted.  Adnexae are unremarkable.  Tiny calcified gallstones are seen, however there is no evidence of acute cholecystitis.  The liver, spleen, pancreas, and adrenal glands are normal appearance.  There is no evidence of lymphadenopathy.  No evidence of dilated bowel loops or hernia.  IMPRESSION:  1.  Moderate left hydronephrosis and perinephric stranding due to 4 mm calculus at the left ureteral pelvic junction. 2.  6 mm nonobstructing calculus lower pole left kidney. 3.  Cholelithiasis, without evidence of cholecystitis.   Original Report Authenticated By: Danae Orleans, M.D.    US Renal  03/02/2012  *RADIOLOGY REPORT*  Clinical Data: Renal failure.  RENAL/URINARY TRACT ULTRASOUND COMPLETE  Comparison:  Abdominal CT 02/27/2012  Findings:  Right Kidney:  Right kidney measures 9.5 cm in length.  The lower pole is partially obscured by bowel gas.  No evidence for right hydronephrosis. There is a slightly hypoechoic structure near the renal hilum that measures up to 1.7 cm.  This most likely  represents a parapelvic cyst based on the previous CT study.  Left Kidney:  Left kidney measures 11.2 cm in length without hydronephrosis.  Hypoechoic structure in the mid pole measures up to 1.4 cm and probably represents a cystic structure.  There is a 5 mm echogenic structure with acoustic shadowing in the lower pole. Findings are consistent with a stone.  There is also mildly hypoechoic structure in the lower pole that measures up to 1.9 cm.  Bladder: Small amount of fluid in the urinary bladder.  There appears to be a ureteral stent within the bladder.  There is concern for posterior bladder wall thickening or debris.  IMPRESSION: No evidence for hydronephrosis.  There are  heterogeneous hypoechoic structures in both kidneys that most likely represent parapelvic cysts.  5 mm left kidney stone.  Debris or posterior wall thickening in the bladder.   Original Report Authenticated By: Richarda Overlie, M.D.    Dg Chest Port 1 View  02/29/2012  *RADIOLOGY REPORT*  Clinical Data: CHF, shortness of breath, weakness  PORTABLE CHEST - 1 VIEW  Comparison: 02/01/2012; 07/06/2011  Findings:  Grossly unchanged enlarged cardiac silhouette and mediastinal contours.  Pulmonary vasculature is indistinct with cephalization of flow.  Grossly unchanged perihilar and bibasilar heterogeneous opacities.  No definite pleural effusion or pneumothorax. Unchanged bones.  IMPRESSION: 1.  Findings compatible with pulmonary edema.  2.  Bibasilar and perihilar opacities favored to represent atelectasis, though underlying infection not excluded.  Continued attention on follow-up is recommended.   Original Report Authenticated By: Waynard Reeds, M.D.    Dg Abd Acute W/chest  02/27/2012  *RADIOLOGY REPORT*  Clinical Data: Abdominal pain.  ACUTE ABDOMEN SERIES (ABDOMEN 2 VIEW & CHEST 1 VIEW)  Comparison: 02/01/2012  Findings: Moderate stool burden throughout the colon. There is normal bowel gas pattern.  No free air.  No organomegaly or suspicious calcification.  No acute bony abnormality.  Mild cardiomegaly and vascular congestion.  Low lung volumes.  No confluent opacities or effusions.  No acute bony abnormality.  IMPRESSION: Moderate stool burden.  No bowel obstruction or free air.  Cardiomegaly, vascular congestion.   Original Report Authenticated By: Cyndie Chime, M.D.       ASSESSMENT:  1. Atrial flutter with RVR - HR controlled  2. Systemic anticoagulation with Xarelto  3. CKD stage 3     PLAN:   1.  D/C Cardizem gtt 2.  Continue Lopressor 3.  Start Cardizem PO 90mg  q6 hours  Quintella Reichert, MD  03/08/2012  2:20 PM

## 2012-03-08 NOTE — Progress Notes (Signed)
Patients cardizem stopped at this time pt vitals are stable blood pressure is 111/60 and heart rate is 92. Will continue to monitor patient throughout the night.

## 2012-03-08 NOTE — Progress Notes (Signed)
Patient ID: Tina [name], female   DOB: 31-Aug-1940, 71 y.o.   MRN: [id number] 4 Days Post-Op  Subjective: Tina [name] has no new complaints but her temp is up to 100.8 this morning and her Cr has jumped back up to 2.4.  She remains on Zosyn for her enterobacter infection that was associated with her left ureteral stone.   She denies flank pain or new voiding complaints.  Her c diff titer was negative.   ROS: Negative except she has chronic leg pain.  She denies CP, SOB, cough, nausea or new abdominal pain.  Objective: Vital signs in last 24 hours: Temp:  [97.4 F (36.3 C)-100.8 F (38.2 C)] 100.8 F (38.2 C) (10/28 0441) Pulse Rate:  [43-141] 63  (10/28 0441) Resp:  [18-20] 20  (10/28 0225) BP: (106-130)/(46-71) 127/65 mmHg (10/28 0441) SpO2:  [96 %-99 %] 96 % (10/28 0441)  Intake/Output from previous day: 10/27 0701 - 10/28 0700 In: 863 [P.O.:600; I.V.:113; IV Piggyback:150] Out: -  Intake/Output this shift:    General appearance: alert and no distress Resp: clear to auscultation bilaterally Cardio: irregularly irregular rhythm GI: Soft with +BS.   Stable mild right abdominal tenderness.   No CVAT.  No masses or HSM.  Extremities: FROM without edema.   Lab Results:  No results found for this basename: WBC:2,HGB:2,HCT:2,PLT:2 in the last 72 hours BMET  [facility] 03/08/12 0445  NA --  K --  CL --  CO2 --  GLUCOSE --  BUN --  CREATININE 2.40*  CALCIUM --   PT/INR No results found for this basename: LABPROT:2,INR:2 in the last 72 hours ABG No results found for this basename: PHART:2,PCO2:2,PO2:2,HCO3:2 in the last 72 hours  Studies/Results: No results found.  Anti-infectives: Anti-infectives     Start     Dose/Rate Route Frequency Ordered Stop   03/01/12 0900  piperacillin-tazobactam (ZOSYN) IVPB 3.375 g       3.375 g 12.5 mL/hr over 240 Minutes Intravenous 3 times per day 03/01/12 0805     02/27/12 2300   ampicillin-sulbactam (UNASYN) 1.5 g in sodium  chloride 0.9 % 50 mL IVPB  Status:  Discontinued        1.5 g 100 mL/hr over 30 Minutes Intravenous Every 8 hours 02/27/12 2123 03/01/12 0757   02/27/12 2200   ciprofloxacin (CIPRO) IVPB 200 mg  Status:  Discontinued        200 mg 100 mL/hr over 60 Minutes Intravenous Every 12 hours 02/27/12 2123 03/01/12 0757   02/27/12 1415   Ampicillin-Sulbactam (UNASYN) 3 g in sodium chloride 0.9 % 100 mL IVPB        3 g 100 mL/hr over 60 Minutes Intravenous  Once 02/27/12 1409 02/27/12 1625   02/27/12 1330   cefTRIAXone (ROCEPHIN) 1 g in dextrose 5 % 50 mL IVPB        1 g 100 mL/hr over 30 Minutes Intravenous  Once 02/27/12 1317 02/27/12 1455          Current Facility-Administered Medications  Medication Dose Route Frequency Provider Last Rate Last Dose  . acetaminophen (TYLENOL) tablet 650 mg  650 mg Oral Q4H PRN Anner Crete, MD   650 mg at 03/05/12 0536  . ALPRAZolam Prudy Feeler) tablet 0.25 mg  0.25 mg Oral Daily Anner Crete, MD   0.25 mg at 03/07/12 0934  . bisacodyl (DULCOLAX) suppository 10 mg  10 mg Rectal Daily PRN Anner Crete, MD      . cholecalciferol (VITAMIN D) tablet  1,000 Units  1,000 Units Oral Daily Anner Crete, MD   1,000 Units at 03/07/12 [phone]  . citalopram (CELEXA) tablet 20 mg  20 mg Oral Daily Anner Crete, MD   20 mg at 03/07/12 0933  . diclofenac sodium (VOLTAREN) 1 % transdermal gel 2 g  2 g Topical QID PRN Anner Crete, MD      . diltiazem (CARDIZEM) 100 mg in dextrose 5 % 100 mL infusion  5-15 mg/hr Intravenous Continuous Quintella Reichert, MD 10 mL/hr at 03/08/12 0542 10 mg/hr at 03/08/12 0542  . docusate sodium (COLACE) capsule 100 mg  100 mg Oral BID Anner Crete, MD   100 mg at 03/05/12 2223  . feeding supplement (ENSURE COMPLETE) liquid 237 mL  237 mL Oral BID BM Jeoffrey Massed, RD   237 mL at 03/07/12 1342  . fentaNYL (SUBLIMAZE) injection 25-50 mcg  25-50 mcg Intravenous Q1H PRN Anner Crete, MD   25 mcg at 03/04/12 1612  . hyoscyamine (LEVSIN SL) SL tablet 0.125 mg   0.125 mg Oral Q4H PRN Anner Crete, MD   0.125 mg at 02/28/12 2203  . insulin aspart (novoLOG) injection 0-15 Units  0-15 Units Subcutaneous TID WC Anner Crete, MD   5 Units at 03/07/12 1758  . insulin glargine (LANTUS) injection 24 Units  24 Units Subcutaneous QHS Kathlen Mody, MD   24 Units at 03/07/12 2223  . meclizine (ANTIVERT) tablet 12.5 mg  12.5 mg Oral TID PRN Anner Crete, MD      . metoprolol tartrate (LOPRESSOR) tablet 25 mg  25 mg Oral BID Quintella Reichert, MD   25 mg at 03/07/12 2223  . multivitamin with minerals tablet 1 tablet  1 tablet Oral Daily Anner Crete, MD   1 tablet at 03/07/12 0934  . ondansetron (ZOFRAN) injection 4 mg  4 mg Intravenous Q4H PRN Anner Crete, MD      . piperacillin-tazobactam (ZOSYN) IVPB 3.375 g  3.375 g Intravenous Q8H Annia Belt, PHARMD   3.375 g at 03/08/12 0517  . pregabalin (LYRICA) capsule 75 mg  75 mg Oral BID Anner Crete, MD   75 mg at 03/07/12 2223  . Rivaroxaban (XARELTO) tablet TABS 15 mg  15 mg Oral Q supper Anner Crete, MD   15 mg at 03/07/12 1758  . traMADol (ULTRAM) tablet 50 mg  50 mg Oral Q6H PRN Anner Crete, MD   50 mg at 03/05/12 1415  . zolpidem (AMBIEN) tablet 5 mg  5 mg Oral QHS PRN Anner Crete, MD   5 mg at 03/04/12 2116  . DISCONTD: diltiazem (CARDIZEM) 100 mg in dextrose 5 % 100 mL infusion  5-15 mg/hr Intravenous Continuous Quintella Reichert, MD 5 mL/hr at 03/06/12 2200 5 mg/hr at 03/06/12 2200  . DISCONTD: metoprolol (LOPRESSOR) tablet 50 mg  50 mg Oral BID Quintella Reichert, MD      . DISCONTD: metoprolol (LOPRESSOR) tablet 50 mg  50 mg Oral BID Quintella Reichert, MD      . DISCONTD: metoprolol tartrate (LOPRESSOR) tablet 75 mg  75 mg Oral BID Quintella Reichert, MD        Assessment: s/p Procedure(s): CYSTOSCOPY/RETROGRADE/URETEROSCOPY/STONE EXTRACTION WITH BASKET  1. Left UPJ stone s/p ureteroscopic removal. 2. UTI with chronic cystitis on day 8/10 zosyn. 3. ARI with worsening Cr after it had begun to decline. 4.  Aflutter with intermittant control.  Now with  reduced rate but persistent irregularity.  Cardiology following. 5. Low grade fever today without other signs or symptoms of infection.   Plan: Continue Zosyn. I will have the medical service sign back on with the increased cr.  Cardiac management per cardiology. Monitor temp and consider cultures, CBC and CXR if it remains elevated.   LOS: 10 days    Adley Castello J 03/08/2012

## 2012-03-09 LAB — BASIC METABOLIC PANEL
CO2: 23 mEq/L (ref 19–32)
Calcium: 8.1 mg/dL — ABNORMAL LOW (ref 8.4–10.5)
Creatinine, Ser: 2.35 mg/dL — ABNORMAL HIGH (ref 0.50–1.10)
Glucose, Bld: 92 mg/dL (ref 70–99)

## 2012-03-09 LAB — GLUCOSE, CAPILLARY
Glucose-Capillary: 99 mg/dL (ref 70–99)
Glucose-Capillary: 171 mg/dL — ABNORMAL HIGH (ref 70–99)

## 2012-03-09 MED ORDER — DILTIAZEM HCL ER COATED BEADS 360 MG PO CP24
360.0000 mg | ORAL_CAPSULE | Freq: Every day | ORAL | Status: DC
  Administered 2012-03-09 – 2012-03-10 (×2): 360 mg via ORAL
  Filled 2012-03-09 (×2): qty 1

## 2012-03-09 NOTE — Progress Notes (Signed)
Patient ID: Tina [name], female   DOB: 05/11/1941, 71 y.o.   MRN: [id number]  Tina [name] is doing better.  She denies pain.   She continues to report some fecal and urinary incontinence, but her frequency of BM's has decreased.   She is now afebrile, but requires one more day of zosyn to complete her treatment course.   She has not been ambulatory.  ROS: She denies CP, SOB, N/V or leg pain.  She is otherwise without complaints.     PMSH: reviewed along with her med list.   PE: T-99.2 BP 124/51 P 44 and irregular  R 18 Gen: WD, WN in NAD  A/O x 3. Lungs: CTA CV: Irregular rate and rhythm with bradycardia. GI:  Soft, Obese with +BS.   Minimal right abdominal tenderness. Ext:  Calves NT.    Labs: Cr 2.4 or 10/28  Imp:  Stable on current therapy, but her Cr did increase yesterday.  Plan: Cont zosyn until tomorrow.           Repeat BMP this morning and tomorrow.           She should be ready for D/C to SNF tomorrow or Thursday unless she has had a further                   worsening of her Cr and needs additional evaluation.   D/C is contingent on cardiology clearance.

## 2012-03-09 NOTE — Progress Notes (Signed)
Physical Therapy Treatment Patient Details Name: Tina [name] MRN: [id number] DOB: [birth year] Today's Date: 03/09/2012 Time: 0925-1009 PT Time Calculation (min): 44 min  PT Assessment / Plan / Recommendation Comments on Treatment Session  Pt requires max encouragement to participate with therapy today.  She had not been out of bed with therapy since 10/25 and has demos increased weakness.  Continues to have increased difficulty following commands and demonstrating safe behavior.     Follow Up Recommendations  Post acute inpatient     Does the patient have the potential to tolerate intense rehabilitation  No, Recommend SNF  Barriers to Discharge        Equipment Recommendations  None recommended by PT    Recommendations for Other Services    Frequency Min 3X/week   Plan Discharge plan needs to be updated;Frequency remains appropriate    Precautions / Restrictions Precautions Precautions: Fall Restrictions Weight Bearing Restrictions: No   Pertinent Vitals/Pain No pain    Mobility  Bed Mobility Bed Mobility: Supine to Sit;Sitting - Scoot to Edge of Bed Supine to Sit: 1: +2 Total assist;HOB elevated;With rails Supine to Sit: Patient Percentage: 30% Sitting - Scoot to Edge of Bed: 2: Max assist Details for Bed Mobility Assistance: Assist for BLEs off of bed and for trunk to attain sitting position.  Provided MAX cues for attending to task and for hand placement to self assist.   Transfers Transfers: Sit to Stand;Stand to Sit Sit to Stand: 1: +2 Total assist;From elevated surface;With upper extremity assist;From bed;From chair/3-in-1;With armrests Sit to Stand: Patient Percentage: 50% Stand to Sit: 1: +2 Total assist;With upper extremity assist;With armrests;To chair/3-in-1 Stand to Sit: Patient Percentage: 50% Stand Pivot Transfers: 1: +2 Total assist Stand Pivot Transfers: Patient Percentage: 50% Details for Transfer Assistance: Assist to rise and stabalize with max  verbal and manual cuing for hand placement and safety, however pt with increased difficulty following commands and demos unsafe sitting during session. Performed transfer x 3 reps in order to assist pt with cleaning.  Ambulation/Gait Ambulation/Gait Assistance: Not tested (comment)    Exercises     PT Diagnosis:    PT Problem List:   PT Treatment Interventions:     PT Goals Acute Rehab PT Goals PT Goal Formulation: With patient Time For Goal Achievement: 03/11/12 Potential to Achieve Goals: Good Pt will go Supine/Side to Sit: with min assist PT Goal: Supine/Side to Sit - Progress: Revised due to lack of progress Pt will go Sit to Stand: with mod assist PT Goal: Sit to Stand - Progress: Revised due to lack of progress Pt will Transfer Bed to Chair/Chair to Bed: with mod assist PT Transfer Goal: Bed to Chair/Chair to Bed - Progress: Revised due to lack of progress Pt will Ambulate: 16 - 50 feet;with least restrictive assistive device;with mod assist PT Goal: Ambulate - Progress: Revised due to lack of progress PT Goal: Up/Down Stairs - Progress: Discontinued (comment)  Visit Information  Last PT Received On: 03/09/12 Assistance Needed: +2    Subjective Data  Subjective: I'm gonna get up today Patient Stated Goal: none stated   Cognition  Overall Cognitive Status: Impaired Area of Impairment: Following commands;Safety/judgement;Awareness of errors;Awareness of deficits;Problem solving Arousal/Alertness: Awake/alert Orientation Level: Appears intact for tasks assessed;Disoriented X4 Behavior During Session: [facility] for tasks performed Following Commands: Follows one step commands with increased time;Follows one step commands inconsistently Safety/Judgement: Decreased awareness of safety precautions;Decreased safety judgement for tasks assessed;Decreased awareness of need for assistance Awareness of Errors: Assistance  required to identify errors made;Assistance required to correct  errors made    Balance     End of Session PT - End of Session Activity Tolerance: Patient limited by fatigue;Other (comment) (fecal incontinence) Patient left: in chair;with call bell/phone within reach Nurse Communication: Mobility status   GP     Page, Meribeth Mattes 03/09/2012, 10:29 AM

## 2012-03-09 NOTE — Progress Notes (Addendum)
SUBJECTIVE:  Doing well.  No complaints  OBJECTIVE:   Vitals:   Filed Vitals:   03/08/12 1151 03/08/12 1501 03/08/12 2027 03/09/12 0530  BP: 112/76 112/65 137/55 124/51  Pulse: 93 93 92 44  Temp: 99.1 F (37.3 C) 97.9 F (36.6 C) 99.3 F (37.4 C) 99.2 F (37.3 C)  TempSrc: Oral Oral Oral Oral  Resp:  18 20 18   Height:      Weight:    112.8 kg (248 lb 10.9 oz)  SpO2: 93% 94% 97% 96%   I&O's:   Intake/Output Summary (Last 24 hours) at 03/09/12 0912 Last data filed at 03/08/12 2244  Gross per 24 hour  Intake    800 ml  Output      0 ml  Net    800 ml   TELEMETRY: Reviewed telemetry pt in atrial fibrilation     PHYSICAL EXAM General: Well developed, well nourished, in no acute distress Head: Eyes PERRLA, No xanthomas.   Normal cephalic and atramatic  Lungs:   Clear bilaterally to auscultation and percussion. Heart:   irregurlalyirregularS1 S2 Pulses are 2+ & equal.            No carotid bruit. No JVD.  No abdominal bruits. No femoral bruits. Abdomen: Bowel sounds are positive, abdomen soft and non-tender without masses  Extremities:   No clubbing, cyanosis or edema.  DP +1 Neuro: Alert and oriented X 3. Psych:  Good affect, responds appropriately   LABS: Basic Metabolic Panel:  Basename 03/08/12 0445  NA --  K --  CL --  CO2 --  GLUCOSE --  BUN --  CREATININE 2.40*  CALCIUM --  MG --  PHOS --   Coag Panel:   Lab Results  Component Value Date   INR 1.04 02/27/2012   INR 1.08 02/06/2012   INR 1.23 02/01/2012    RADIOLOGY: Ct Abdomen Pelvis W Contrast  02/27/2012  *RADIOLOGY REPORT*  Clinical Data: Lower abdominal pain and guarding.  Nausea and vomiting.  CT ABDOMEN AND PELVIS WITH CONTRAST  Technique:  Multidetector CT imaging of the abdomen and pelvis was performed following the standard protocol during bolus administration of intravenous contrast.  Contrast: 75mL OMNIPAQUE IOHEXOL 300 MG/ML  SOLN  Comparison: 02/01/2012  Findings: Moderate left  hydronephrosis and left perinephric stranding is seen.  A 4 mm calculus is seen at the left ureteropelvic junction.  No other ureteral calculi or dilatation seen.  A 6 mm nonobstructing calculus is again seen in the lower pole of the left kidney.  The right kidney is normal in appearance.  Foley catheter is seen within the urinary bladder which is empty. Prior hysterectomy noted.  Adnexae are unremarkable.  Tiny calcified gallstones are seen, however there is no evidence of acute cholecystitis.  The liver, spleen, pancreas, and adrenal glands are normal appearance.  There is no evidence of lymphadenopathy.  No evidence of dilated bowel loops or hernia.  IMPRESSION:  1.  Moderate left hydronephrosis and perinephric stranding due to 4 mm calculus at the left ureteral pelvic junction. 2.  6 mm nonobstructing calculus lower pole left kidney. 3.  Cholelithiasis, without evidence of cholecystitis.   Original Report Authenticated By: Danae Orleans, M.D.    US Renal  03/02/2012  *RADIOLOGY REPORT*  Clinical Data: Renal failure.  RENAL/URINARY TRACT ULTRASOUND COMPLETE  Comparison:  Abdominal CT 02/27/2012  Findings:  Right Kidney:  Right kidney measures 9.5 cm in length.  The lower pole is partially obscured by bowel  gas.  No evidence for right hydronephrosis. There is a slightly hypoechoic structure near the renal hilum that measures up to 1.7 cm.  This most likely represents a parapelvic cyst based on the previous CT study.  Left Kidney:  Left kidney measures 11.2 cm in length without hydronephrosis.  Hypoechoic structure in the mid pole measures up to 1.4 cm and probably represents a cystic structure.  There is a 5 mm echogenic structure with acoustic shadowing in the lower pole. Findings are consistent with a stone.  There is also mildly hypoechoic structure in the lower pole that measures up to 1.9 cm.  Bladder: Small amount of fluid in the urinary bladder.  There appears to be a ureteral stent within the bladder.   There is concern for posterior bladder wall thickening or debris.  IMPRESSION: No evidence for hydronephrosis.  There are heterogeneous hypoechoic structures in both kidneys that most likely represent parapelvic cysts.  5 mm left kidney stone.  Debris or posterior wall thickening in the bladder.   Original Report Authenticated By: Richarda Overlie, M.D.    Dg Chest Port 1 View  02/29/2012  *RADIOLOGY REPORT*  Clinical Data: CHF, shortness of breath, weakness  PORTABLE CHEST - 1 VIEW  Comparison: 02/01/2012; 07/06/2011  Findings:  Grossly unchanged enlarged cardiac silhouette and mediastinal contours.  Pulmonary vasculature is indistinct with cephalization of flow.  Grossly unchanged perihilar and bibasilar heterogeneous opacities.  No definite pleural effusion or pneumothorax. Unchanged bones.  IMPRESSION: 1.  Findings compatible with pulmonary edema.  2.  Bibasilar and perihilar opacities favored to represent atelectasis, though underlying infection not excluded.  Continued attention on follow-up is recommended.   Original Report Authenticated By: Waynard Reeds, M.D.    Dg Abd Acute W/chest  02/27/2012  *RADIOLOGY REPORT*  Clinical Data: Abdominal pain.  ACUTE ABDOMEN SERIES (ABDOMEN 2 VIEW & CHEST 1 VIEW)  Comparison: 02/01/2012  Findings: Moderate stool burden throughout the colon. There is normal bowel gas pattern.  No free air.  No organomegaly or suspicious calcification.  No acute bony abnormality.  Mild cardiomegaly and vascular congestion.  Low lung volumes.  No confluent opacities or effusions.  No acute bony abnormality.  IMPRESSION: Moderate stool burden.  No bowel obstruction or free air.  Cardiomegaly, vascular congestion.   Original Report Authenticated By: Cyndie Chime, M.D.       ASSESSMENT:  1. Atrial flutter with RVR - HR fairly well controlled on beta blockers and CCB 2. Systemic anticoagulation with Xarelto  3. CKD stage 3 - creatinine elevated some today - per Hospitalist 4.   Diastolic dysfunction - no signs of CHF  PLAN:   1.  Change Cardizem to CD 360mg  daily 2.  Continue metoprolol   Quintella Reichert, MD  03/09/2012  9:12 AM

## 2012-03-09 NOTE — Progress Notes (Signed)
03-09-12  NSG:  Pt refusing to let us turn her this shift.  Pt has skin breakdown on buttock and bil heels.  Much education provided ZO:XWRU including turning and floating of heels.  Pt verbalizes understanding but continues to refuse.  Will continue to encourage mobility and pulmonary toilet.

## 2012-03-10 DIAGNOSIS — R159 Full incontinence of feces: Secondary | ICD-10-CM

## 2012-03-10 DIAGNOSIS — R197 Diarrhea, unspecified: Secondary | ICD-10-CM

## 2012-03-10 MED ORDER — HYOSCYAMINE SULFATE 0.125 MG SL SUBL: 0.1250 mg | SUBLINGUAL_TABLET | SUBLINGUAL | Status: DC | PRN

## 2012-03-10 MED ORDER — BISACODYL 10 MG RE SUPP: 10.0000 mg | Freq: Every day | RECTAL | Status: AC | PRN

## 2012-03-10 MED ORDER — MECLIZINE HCL 12.5 MG PO TABS: 12.5000 mg | ORAL_TABLET | Freq: Three times a day (TID) | ORAL | Status: DC | PRN

## 2012-03-10 MED ORDER — RIVAROXABAN 15 MG PO TABS: 15.0000 mg | ORAL_TABLET | Freq: Every day | ORAL | Status: DC

## 2012-03-10 MED ORDER — CHOLESTYRAMINE 4 G PO PACK: 4.0000 g | PACK | Freq: Two times a day (BID) | ORAL | Status: DC

## 2012-03-10 MED ORDER — DILTIAZEM HCL ER COATED BEADS 360 MG PO CP24: 360.0000 mg | ORAL_CAPSULE | Freq: Every day | ORAL | Status: AC

## 2012-03-10 MED ORDER — INSULIN ASPART 100 UNIT/ML ~~LOC~~ SOLN: 0.0000 [IU] | Freq: Three times a day (TID) | SUBCUTANEOUS | Status: DC

## 2012-03-10 MED ORDER — CHOLESTYRAMINE 4 G PO PACK
4.0000 g | PACK | Freq: Two times a day (BID) | ORAL | Status: DC
  Filled 2012-03-10: qty 1

## 2012-03-10 MED ORDER — METOPROLOL TARTRATE 25 MG PO TABS: 25.0000 mg | ORAL_TABLET | Freq: Two times a day (BID) | ORAL | Status: DC
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
## 2012-03-10 NOTE — Progress Notes (Signed)
Patient ID: Tina [name], female   DOB: December 31, 1940, 71 y.o.   MRN: [id number] 6 Days Post-Op  Subjective: Tina [name] is doing well with everything but her bowels.  She reports fecal incontinence associated with urinary incontinence when she stands.   The fecal incontinence is her biggest concern.   She denies left flank pain.  She is on day 10 of zosyn ROS: Negative except for some chronic right abdominal pain.    Objective: Vital signs in last 24 hours: Temp:  [98.3 F (36.8 C)-99.1 F (37.3 C)] 98.4 F (36.9 C) (10/30 0647) Pulse Rate:  [77-135] 91  (10/30 0647) Resp:  [16-28] 16  (10/30 0647) BP: (107-146)/(38-78) 127/71 mmHg (10/30 0647) SpO2:  [94 %-98 %] 94 % (10/30 0647) Weight:  [115.9 kg (255 lb 8.2 oz)] 115.9 kg (255 lb 8.2 oz) (10/30 0647)  Intake/Output from previous day: 10/29 0701 - 10/30 0700 In: 560 [P.O.:460; IV Piggyback:100] Out: 265 [Urine:265] Intake/Output this shift:    General appearance: alert and no distress Resp: clear to auscultation bilaterally Cardio: irregularly irregular rhythm GI: soft, +BS with stable mild right sided tenderness.   Lab Results:  No results found for this basename: WBC:2,HGB:2,HCT:2,PLT:2 in the last 72 hours BMET  [facility] 03/09/12 0912 03/08/12 0445  NA 137 --  K 4.0 --  CL 103 --  CO2 23 --  GLUCOSE 92 --  BUN 34* --  CREATININE 2.35* 2.40*  CALCIUM 8.1* --   PT/INR No results found for this basename: LABPROT:2,INR:2 in the last 72 hours ABG No results found for this basename: PHART:2,PCO2:2,PO2:2,HCO3:2 in the last 72 hours  Studies/Results: No results found.  Anti-infectives: Anti-infectives     Start     Dose/Rate Route Frequency Ordered Stop   03/01/12 0900  piperacillin-tazobactam (ZOSYN) IVPB 3.375 g       3.375 g 12.5 mL/hr over 240 Minutes Intravenous 3 times per day 03/01/12 0805     02/27/12 2300   ampicillin-sulbactam (UNASYN) 1.5 g in sodium chloride 0.9 % 50 mL IVPB  Status:  Discontinued         1.5 g 100 mL/hr over 30 Minutes Intravenous Every 8 hours 02/27/12 2123 03/01/12 0757   02/27/12 2200   ciprofloxacin (CIPRO) IVPB 200 mg  Status:  Discontinued        200 mg 100 mL/hr over 60 Minutes Intravenous Every 12 hours 02/27/12 2123 03/01/12 0757   02/27/12 1415   Ampicillin-Sulbactam (UNASYN) 3 g in sodium chloride 0.9 % 100 mL IVPB        3 g 100 mL/hr over 60 Minutes Intravenous  Once 02/27/12 1409 02/27/12 1625   02/27/12 1330   cefTRIAXone (ROCEPHIN) 1 g in dextrose 5 % 50 mL IVPB        1 g 100 mL/hr over 30 Minutes Intravenous  Once 02/27/12 1317 02/27/12 1455          Current Facility-Administered Medications  Medication Dose Route Frequency Provider Last Rate Last Dose  . acetaminophen (TYLENOL) tablet 650 mg  650 mg Oral Q4H PRN Anner Crete, MD   650 mg at 03/09/12 2037  . ALPRAZolam Prudy Feeler) tablet 0.25 mg  0.25 mg Oral Daily Anner Crete, MD   0.25 mg at 03/09/12 1051  . bisacodyl (DULCOLAX) suppository 10 mg  10 mg Rectal Daily PRN Anner Crete, MD      . cholecalciferol (VITAMIN D) tablet 1,000 Units  1,000 Units Oral Daily Anner Crete, MD   1,000  Units at 03/09/12 1051  . citalopram (CELEXA) tablet 20 mg  20 mg Oral Daily Anner Crete, MD   20 mg at 03/09/12 1051  . diclofenac sodium (VOLTAREN) 1 % transdermal gel 2 g  2 g Topical QID PRN Anner Crete, MD      . diltiazem (CARDIZEM CD) 24 hr capsule 360 mg  360 mg Oral Daily Quintella Reichert, MD   360 mg at 03/09/12 1051  . docusate sodium (COLACE) capsule 100 mg  100 mg Oral BID Anner Crete, MD   100 mg at 03/08/12 2244  . feeding supplement (ENSURE COMPLETE) liquid 237 mL  237 mL Oral BID BM Jeoffrey Massed, RD   237 mL at 03/09/12 1425  . fentaNYL (SUBLIMAZE) injection 25-50 mcg  25-50 mcg Intravenous Q1H PRN Anner Crete, MD   25 mcg at 03/04/12 1612  . hyoscyamine (LEVSIN SL) SL tablet 0.125 mg  0.125 mg Oral Q4H PRN Anner Crete, MD   0.125 mg at 02/28/12 2203  . insulin aspart (novoLOG) injection  0-15 Units  0-15 Units Subcutaneous TID WC Anner Crete, MD   5 Units at 03/09/12 1757  . insulin glargine (LANTUS) injection 24 Units  24 Units Subcutaneous QHS Kathlen Mody, MD   24 Units at 03/09/12 2256  . meclizine (ANTIVERT) tablet 12.5 mg  12.5 mg Oral TID PRN Anner Crete, MD      . metoprolol tartrate (LOPRESSOR) tablet 25 mg  25 mg Oral BID Quintella Reichert, MD   25 mg at 03/09/12 2257  . multivitamin with minerals tablet 1 tablet  1 tablet Oral Daily Anner Crete, MD   1 tablet at 03/09/12 1051  . ondansetron (ZOFRAN) injection 4 mg  4 mg Intravenous Q4H PRN Anner Crete, MD      . piperacillin-tazobactam (ZOSYN) IVPB 3.375 g  3.375 g Intravenous Q8H Annia Belt, PHARMD   3.375 g at 03/10/12 1610  . pregabalin (LYRICA) capsule 75 mg  75 mg Oral BID Anner Crete, MD   75 mg at 03/09/12 2256  . Rivaroxaban (XARELTO) tablet TABS 15 mg  15 mg Oral Q supper Anner Crete, MD   15 mg at 03/09/12 1756  . traMADol (ULTRAM) tablet 50 mg  50 mg Oral Q6H PRN Anner Crete, MD   50 mg at 03/09/12 2037  . zolpidem (AMBIEN) tablet 5 mg  5 mg Oral QHS PRN Anner Crete, MD   5 mg at 03/09/12 2256  . DISCONTD: diltiazem (CARDIZEM) tablet 90 mg  90 mg Oral QID Quintella Reichert, MD   90 mg at 03/08/12 2244    Assessment: s/p Procedure(s): CYSTOSCOPY/RETROGRADE/URETEROSCOPY/STONE EXTRACTION WITH BASKET  His of left ureteral stone with obstruction and UTI doing well post ureteroscopy and IV zosyn for enterobacter. Stable acute on chronic renal insufficiency. Aflutter under cardiology management.  Fecal incontinence.  Plan: D/C zosyn. I will get a GI consult for the fecal incontinence. She should be able to be d/c'd to SNF at anytime.    LOS: 12 days    Virgal Warmuth J 03/10/2012

## 2012-03-10 NOTE — Progress Notes (Signed)
Patient has bed at [facility] health care center and Berkley Harvey has been obtained.  Hilliary Jock C. Adreyan Carbajal MSW, LCSW [phone]

## 2012-03-10 NOTE — Discharge Summary (Signed)
Date of admission: 02/27/2012  Date of discharge: 03/10/2012  Admission diagnosis: pyelonephritis and left ureteral stone  Discharge diagnosis: Enterobacter pyelonephritis, left nephrolithiasis, aflutter, fecal and urinary incontinence, chronic anemia, chronic kidney disease, DM, chronic cystitis, deconditioning, pressure ulcer of bilateral heels, altered mental status.  History and Physical: For full details, please see admission history and physical. Briefly, Tina [name] is a 71 y.o. year old patient who was seen in the ER at [facility] 02/27/12 by Dr. Annabell Howells for severe left flank pain that began the day before. She was noted to have a fever to 101.5 and an infected urine. A CT was done that showed a 4mm left UPJ stone with obstruction and there was a 6mm renal stone. She had a CT in September that showed the stone in the same spot without as much hydro. Pt was admitted for treatment of obstruction and infection.  Hospital Course:  Pt was admitted on 02/27/12 and taken to the OR for cysto/left ureteral stent placement/bladder stone removal.  She was started on IV ABx and cultures were obtained.  Pt tolerated the procedure well.  Internal medicine was consulted to aid with care of pt's chronic medical issues.  Urine culture revealed Enterobacter which was sensitive only to Zosyn.  Her Abx was appropriately changed and she completed a 10 day course.  Once her fevers subsided she was taken back to the OR on 03/04/12 for cysto/renal and ureteral stone extractions which again she tolerated well.  She developed progressive renal insufficiency which later stabilized.  She is deconditioned with decreased mobility although she has been working with PT.  On 03/05/12 she developed aflutter for which she was started on IV cardizem.  Cardiology was consulted and followed her throughout the remainder of her hospitalization.   Rate and BP control were eventually obtained on PO Cardizem and metoprolol.  She was also  restarted on Xarelto which she was previously taking for her history of afib.  Pt has had fecal and urinary incontinence throughout the hospital stay.  A C Diff was checked and was negative.  A GI consult was obtained on 03/10/12 and the pt was started on Questran to aid with fecal incontinence.  Due to her multiple medical issues and deconditioning, the pt is going to require SNF placement.  On 03/10/12 her medical issues are stable and there is a bed available for her to transfer to SNF care. She does have left ureteral stents which will need to be removed by Dr. Annabell Howells as an outpatient.    Laboratory values:  Lab Results  Component Value Date   CREATININE 2.35* 03/09/2012   Lab Results  Component Value Date   WBC 13.7* 03/05/2012   HGB 9.8* 03/05/2012   HCT 30.0* 03/05/2012   MCV 83.8 03/05/2012   PLT 275 03/05/2012    Disposition: Skilled nursing facility  Discharge instruction: Pt will need to continue ambulation as instructed by PT  Discharge medications:     Medication List     As of 03/10/2012  4:34 PM    START taking these medications         bisacodyl 10 MG suppository   Commonly known as: DULCOLAX   Place 1 suppository (10 mg total) rectally daily as needed.      cholestyramine 4 G packet   Commonly known as: QUESTRAN   Take 1 packet (4 g total) by mouth every 12 (twelve) hours.      diltiazem 360 MG 24 hr capsule   Commonly  known as: CARDIZEM CD   Take 1 capsule (360 mg total) by mouth daily.      hyoscyamine 0.125 MG SL tablet   Commonly known as: LEVSIN SL   Take 1 tablet (0.125 mg total) by mouth every 4 (four) hours as needed.      metoprolol tartrate 25 MG tablet   Commonly known as: LOPRESSOR   Take 1 tablet (25 mg total) by mouth 2 (two) times daily.      Rivaroxaban 15 MG Tabs tablet   Commonly known as: XARELTO   Take 1 tablet (15 mg total) by mouth daily with supper.      CHANGE how you take these medications         insulin aspart 100  UNIT/ML injection   Commonly known as: novoLOG   Inject 0-15 Units into the skin 3 (three) times daily with meals.   What changed: - dose - how often to take the med - doctor's instructions      meclizine 12.5 MG tablet   Commonly known as: ANTIVERT   Take 1 tablet (12.5 mg total) by mouth 3 (three) times daily as needed for dizziness.   What changed: - reasons to take the med - doctor's instructions      CONTINUE taking these medications         ALPRAZolam 0.25 MG tablet   Commonly known as: XANAX      cholecalciferol 1000 UNITS tablet   Commonly known as: VITAMIN D      citalopram 20 MG tablet   Commonly known as: CELEXA      diclofenac sodium 1 % Gel   Commonly known as: VOLTAREN   Apply 1 application topically 4 (four) times daily as needed. As needed for pain      insulin glargine 100 UNIT/ML injection   Commonly known as: LANTUS      LYRICA 75 MG capsule   Generic drug: pregabalin      multivitamin with minerals Tabs      vitamin C 500 MG tablet   Commonly known as: ASCORBIC ACID      zinc sulfate 220 MG capsule      STOP taking these medications         diltiazem 120 MG 24 hr capsule   Commonly known as: TIAZAC      temazepam 7.5 MG capsule   Commonly known as: RESTORIL          Where to get your medications       Information on where to get these meds is not yet available. Ask your nurse or doctor.         bisacodyl 10 MG suppository   cholestyramine 4 G packet   diltiazem 360 MG 24 hr capsule   hyoscyamine 0.125 MG SL tablet   insulin aspart 100 UNIT/ML injection   meclizine 12.5 MG tablet   metoprolol tartrate 25 MG tablet   Rivaroxaban 15 MG Tabs tablet             Followup: Dr. Caprice Kluver office for appt in 3-4 weeks.  [phone]  Dr. Flossie Dibble cardiology office for f/u appt  Dr. Nancee Liter GI office for f/u appt

## 2012-03-10 NOTE — Progress Notes (Signed)
Per nursing student / nursing student instructor, patients HR = 45.  Checked with MT and unable to confirm this.  Current HR in the 100's.  Will continue to monitor.

## 2012-03-10 NOTE — Progress Notes (Signed)
SUBJECTIVE:  Rate controlled.  OBJECTIVE:   Vitals:   Filed Vitals:   03/10/12 0900 03/10/12 1030 03/10/12 1112 03/10/12 1427  BP: 119/49   115/47  Pulse: 45 103 95 88  Temp: 98.1 F (36.7 C)   97.8 F (36.6 C)  TempSrc: Oral   Oral  Resp: 22   20  Height:      Weight:      SpO2: 97%   98%   I&O's:    Intake/Output Summary (Last 24 hours) at 03/10/12 1753 Last data filed at 03/10/12 1430  Gross per 24 hour  Intake    697 ml  Output    265 ml  Net    432 ml   TELEMETRY: Reviewed telemetry pt in atrial fibrilation     PHYSICAL EXAM General: Well developed, well nourished, in no acute distress Head:   Normal cephalic and atramatic  Lungs:   No wheezing Heart:   irregurlalyirregularS1 S2      LABS: Basic Metabolic Panel:  Basename 03/09/12 0912 03/08/12 0445  NA 137 --  K 4.0 --  CL 103 --  CO2 23 --  GLUCOSE 92 --  BUN 34* --  CREATININE 2.35* 2.40*  CALCIUM 8.1* --  MG -- --  PHOS -- --   Coag Panel:   Lab Results  Component Value Date   INR 1.04 02/27/2012   INR 1.08 02/06/2012   INR 1.23 02/01/2012    RADIOLOGY: Ct Abdomen Pelvis W Contrast  02/27/2012  *RADIOLOGY REPORT*  Clinical Data: Lower abdominal pain and guarding.  Nausea and vomiting.  CT ABDOMEN AND PELVIS WITH CONTRAST  Technique:  Multidetector CT imaging of the abdomen and pelvis was performed following the standard protocol during bolus administration of intravenous contrast.  Contrast: 75mL OMNIPAQUE IOHEXOL 300 MG/ML  SOLN  Comparison: 02/01/2012  Findings: Moderate left hydronephrosis and left perinephric stranding is seen.  A 4 mm calculus is seen at the left ureteropelvic junction.  No other ureteral calculi or dilatation seen.  A 6 mm nonobstructing calculus is again seen in the lower pole of the left kidney.  The right kidney is normal in appearance.  Foley catheter is seen within the urinary bladder which is empty. Prior hysterectomy noted.  Adnexae are unremarkable.  Tiny calcified  gallstones are seen, however there is no evidence of acute cholecystitis.  The liver, spleen, pancreas, and adrenal glands are normal appearance.  There is no evidence of lymphadenopathy.  No evidence of dilated bowel loops or hernia.  IMPRESSION:  1.  Moderate left hydronephrosis and perinephric stranding due to 4 mm calculus at the left ureteral pelvic junction. 2.  6 mm nonobstructing calculus lower pole left kidney. 3.  Cholelithiasis, without evidence of cholecystitis.   Original Report Authenticated By: Danae Orleans, M.D.    US Renal  03/02/2012  *RADIOLOGY REPORT*  Clinical Data: Renal failure.  RENAL/URINARY TRACT ULTRASOUND COMPLETE  Comparison:  Abdominal CT 02/27/2012  Findings:  Right Kidney:  Right kidney measures 9.5 cm in length.  The lower pole is partially obscured by bowel gas.  No evidence for right hydronephrosis. There is a slightly hypoechoic structure near the renal hilum that measures up to 1.7 cm.  This most likely represents a parapelvic cyst based on the previous CT study.  Left Kidney:  Left kidney measures 11.2 cm in length without hydronephrosis.  Hypoechoic structure in the mid pole measures up to 1.4 cm and probably represents a cystic structure.  There is  a 5 mm echogenic structure with acoustic shadowing in the lower pole. Findings are consistent with a stone.  There is also mildly hypoechoic structure in the lower pole that measures up to 1.9 cm.  Bladder: Small amount of fluid in the urinary bladder.  There appears to be a ureteral stent within the bladder.  There is concern for posterior bladder wall thickening or debris.  IMPRESSION: No evidence for hydronephrosis.  There are heterogeneous hypoechoic structures in both kidneys that most likely represent parapelvic cysts.  5 mm left kidney stone.  Debris or posterior wall thickening in the bladder.   Original Report Authenticated By: Richarda Overlie, M.D.    Dg Chest Port 1 View  02/29/2012  *RADIOLOGY REPORT*  Clinical Data:  CHF, shortness of breath, weakness  PORTABLE CHEST - 1 VIEW  Comparison: 02/01/2012; 07/06/2011  Findings:  Grossly unchanged enlarged cardiac silhouette and mediastinal contours.  Pulmonary vasculature is indistinct with cephalization of flow.  Grossly unchanged perihilar and bibasilar heterogeneous opacities.  No definite pleural effusion or pneumothorax. Unchanged bones.  IMPRESSION: 1.  Findings compatible with pulmonary edema.  2.  Bibasilar and perihilar opacities favored to represent atelectasis, though underlying infection not excluded.  Continued attention on follow-up is recommended.   Original Report Authenticated By: Waynard Reeds, M.D.    Dg Abd Acute W/chest  02/27/2012  *RADIOLOGY REPORT*  Clinical Data: Abdominal pain.  ACUTE ABDOMEN SERIES (ABDOMEN 2 VIEW & CHEST 1 VIEW)  Comparison: 02/01/2012  Findings: Moderate stool burden throughout the colon. There is normal bowel gas pattern.  No free air.  No organomegaly or suspicious calcification.  No acute bony abnormality.  Mild cardiomegaly and vascular congestion.  Low lung volumes.  No confluent opacities or effusions.  No acute bony abnormality.  IMPRESSION: Moderate stool burden.  No bowel obstruction or free air.  Cardiomegaly, vascular congestion.   Original Report Authenticated By: Cyndie Chime, M.D.       ASSESSMENT:  1. Atrial flutter with RVR - HR fairly well controlled on beta blockers and CCB 2. Systemic anticoagulation with Xarelto  3. CKD stage 3 - creatinine elevated some today - per Hospitalist 4.  Diastolic dysfunction - no signs of CHF  PLAN:   1.  Continue Cardizem to CD 360mg  daily 2.  Continue metoprolol  3. Follow renal function to ensure proper dosing of Xarelto.  Corky Crafts., MD  03/10/2012  5:53 PM

## 2012-03-10 NOTE — Progress Notes (Signed)
Nutrition Follow-up  Intervention:  1. Will continue to provide ensure nutrition supplement BID, provides 500 kcal and 18 grams of protein daily.  2. Encouraged patient to have adequate PO intake.  3. RD to follow for nutrition plan of care.    Assessment:   Patient with poor PO intake documented 5-10% at meals. She reported she is not eating well because she does not like the food here. She reported she is drinking the Ensure nutrition supplement. I went through the menu with patient to help identify foods she may like.   Diet Order:  Carb Modified  Meds: Scheduled Meds:   . ALPRAZolam  0.25 mg Oral Daily  . cholecalciferol  1,000 Units Oral Daily  . cholestyramine  4 g Oral BID  . citalopram  20 mg Oral Daily  . diltiazem  360 mg Oral Daily  . docusate sodium  100 mg Oral BID  . feeding supplement  237 mL Oral BID BM  . insulin aspart  0-15 Units Subcutaneous TID WC  . insulin glargine  24 Units Subcutaneous QHS  . metoprolol tartrate  25 mg Oral BID  . multivitamin with minerals  1 tablet Oral Daily  . pregabalin  75 mg Oral BID  . rivaroxaban  15 mg Oral Q supper  . DISCONTD: piperacillin-tazobactam (ZOSYN)  IV  3.375 g Intravenous Q8H   Continuous Infusions:  PRN Meds:.acetaminophen, bisacodyl, diclofenac sodium, fentaNYL, hyoscyamine, meclizine, ondansetron, traMADol, zolpidem   CMP     Component Value Date/Time   NA 137 03/09/2012 0912   K 4.0 03/09/2012 0912   CL 103 03/09/2012 0912   CO2 23 03/09/2012 0912   GLUCOSE 92 03/09/2012 0912   BUN 34* 03/09/2012 0912   CREATININE 2.35* 03/09/2012 0912   CREATININE 1.87* 07/28/2011 1452   CALCIUM 8.1* 03/09/2012 0912   PROT 7.4 02/27/2012 1002   ALBUMIN 3.2* 02/27/2012 1002   AST 15 02/27/2012 1002   ALT 9 02/27/2012 1002   ALKPHOS 118* 02/27/2012 1002   BILITOT 0.5 02/27/2012 1002   GFRNONAA 20* 03/09/2012 0912   GFRAA 23* 03/09/2012 0912    CBG (last 3)   Basename 03/10/12 1222 03/10/12 0749 03/09/12 1655    GLUCAP 163* 97 211*     Intake/Output Summary (Last 24 hours) at 03/10/12 1452 Last data filed at 03/10/12 1430  Gross per 24 hour  Intake    697 ml  Output    265 ml  Net    432 ml    Weight Status:  Wt stable  Re-estimated needs:  1800-2000 kcal, 95-105 grams protein  Nutrition Dx:  Inadequate Oral Intake, ongoing.   Goal:  Intake to meet > 75% of estimated energy needs, not meeting continue.   Monitor:  PO intake, weights, labs   Adron Bene [phone]

## 2012-03-10 NOTE — Progress Notes (Signed)
Vital signs taken. Blood pressure and heart rate somewhat low.  Notified Cardiologist on call. Orders given to continue with Cardizem and Lopressor. Medications given to patient. VSS

## 2012-06-12 ENCOUNTER — Ambulatory Visit: Payer: Self-pay | Admitting: Nurse Practitioner

## 2012-07-10 ENCOUNTER — Ambulatory Visit: Payer: Self-pay | Admitting: Nurse Practitioner

## 2012-08-10 ENCOUNTER — Ambulatory Visit: Payer: Self-pay | Admitting: Nurse Practitioner

## 2012-09-09 ENCOUNTER — Ambulatory Visit: Payer: Self-pay | Admitting: Nurse Practitioner

## 2012-11-18 ENCOUNTER — Other Ambulatory Visit: Payer: Self-pay

## 2013-05-12 ENCOUNTER — Ambulatory Visit: Payer: Self-pay | Admitting: Nurse Practitioner

## 2013-06-12 ENCOUNTER — Ambulatory Visit: Payer: Self-pay | Admitting: Nurse Practitioner

## 2013-07-10 ENCOUNTER — Ambulatory Visit: Payer: Self-pay | Admitting: Nurse Practitioner

## 2013-08-10 ENCOUNTER — Ambulatory Visit: Payer: Self-pay | Admitting: Nurse Practitioner

## 2013-10-03 IMAGING — CT CT ABD-PELV W/O CM
1 of 4 series · 13 of 36 positions shown, 17 images · non-contrast
Comparison: CT abdomen pelvis - 04/10/2009;

CLINICAL DATA: Diffuse abdominal pain, diarrhea, renal failure

CT ABDOMEN AND PELVIS WITHOUT CONTRAST
TECHNIQUE: Multidetector CT imaging of the abdomen and pelvis was
performed following the standard protocol without intravenous
contrast.

[Series 2: routine abdomen · axial · 0.98mm/px · z∈[-369,+51]mm · 13 of 110 slices shown, 17 images]
[im 9/110  soft-tissue]
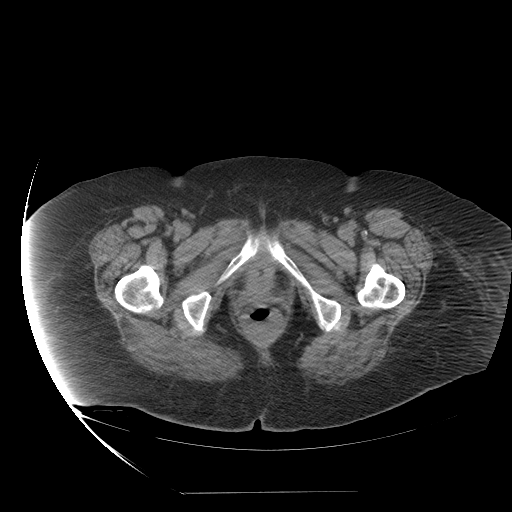
[im 9/110  bone]
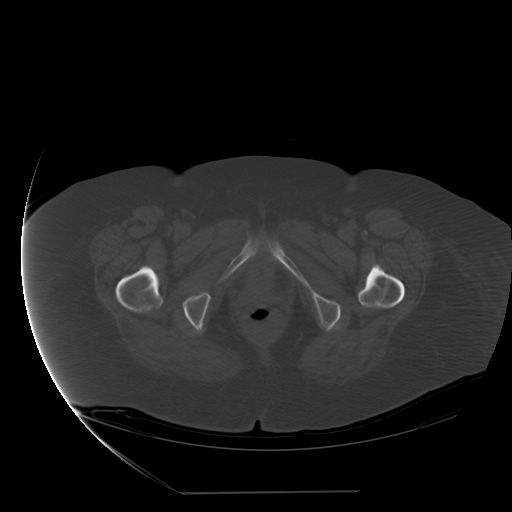
[im 18/110  soft-tissue]
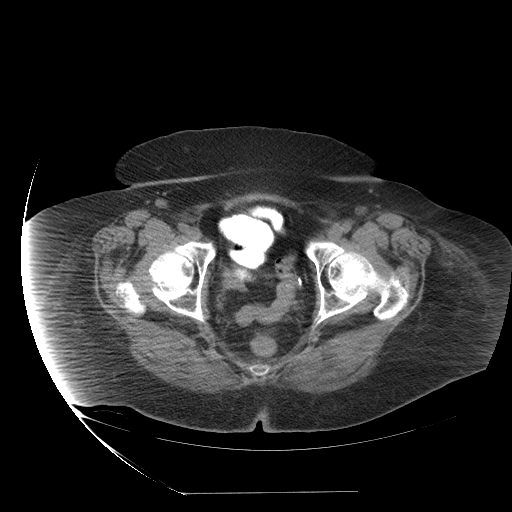
[im 27/110  soft-tissue]
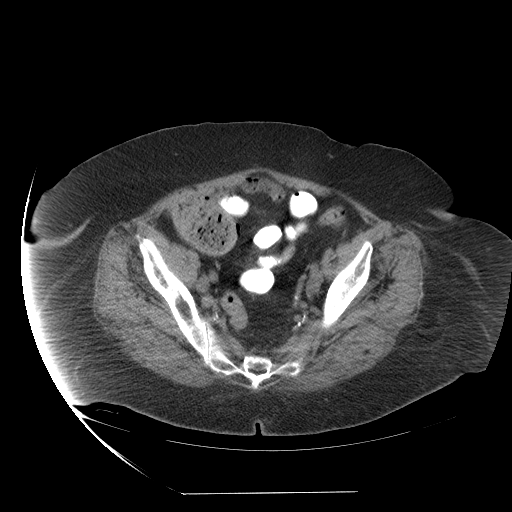
[im 35/110  soft-tissue]
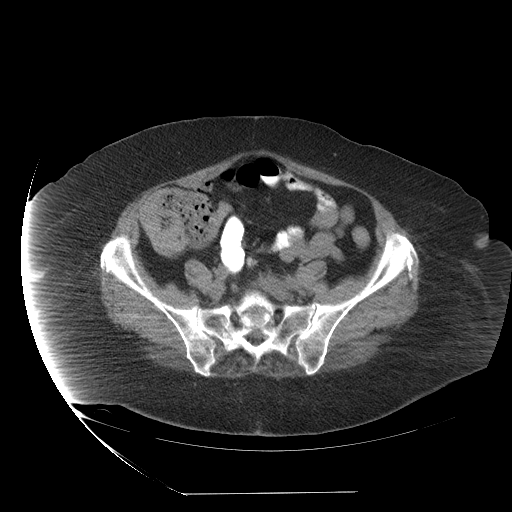
[im 44/110  soft-tissue]
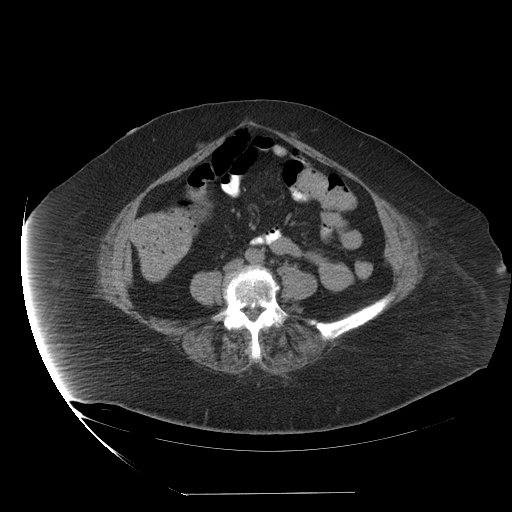
[im 57/110  soft-tissue]
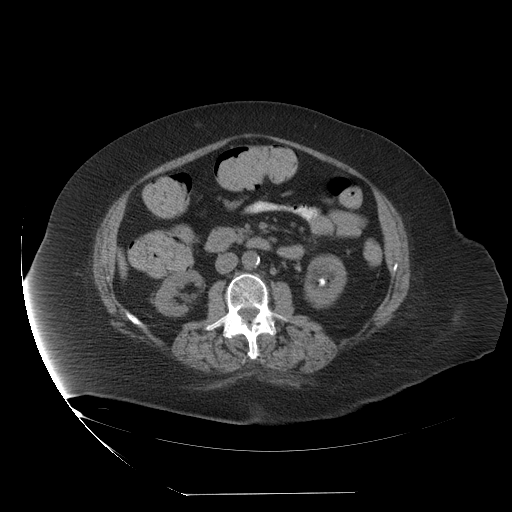
[im 66/110  soft-tissue]
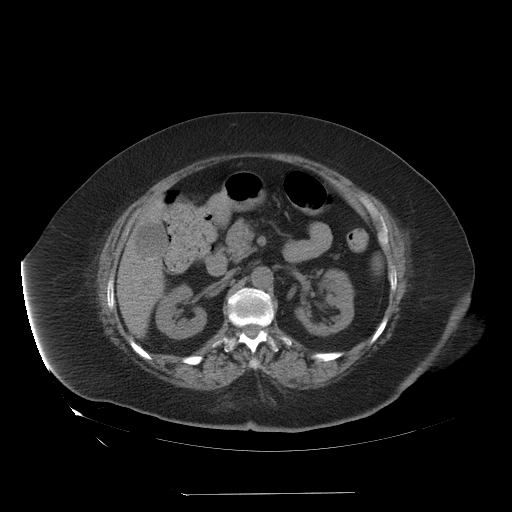
[im 75/110  soft-tissue]
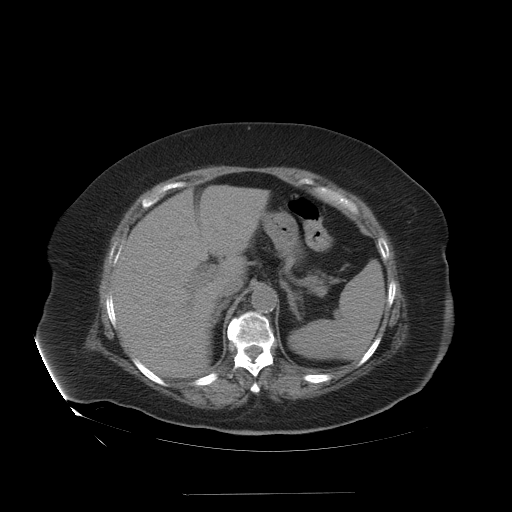
[im 83/110  soft-tissue]
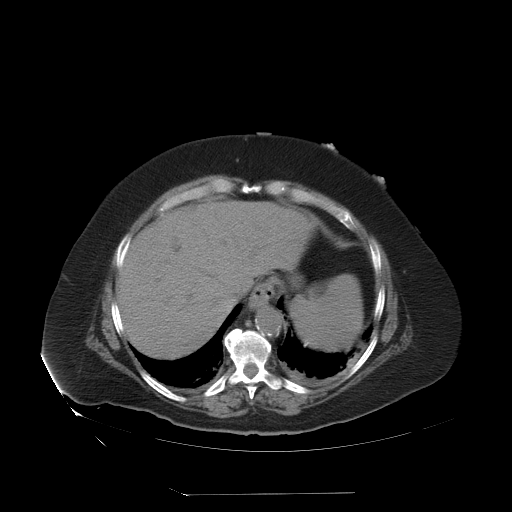
[im 83/110  bone]
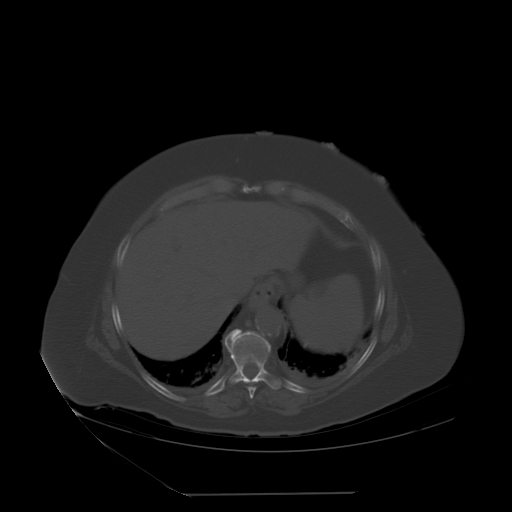
[im 92/110  soft-tissue]
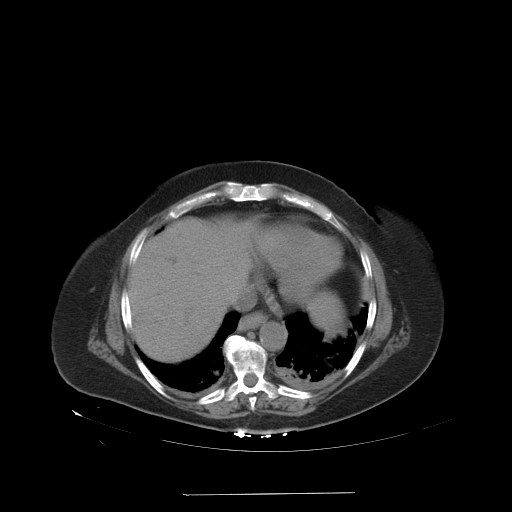
[im 92/110  lung]
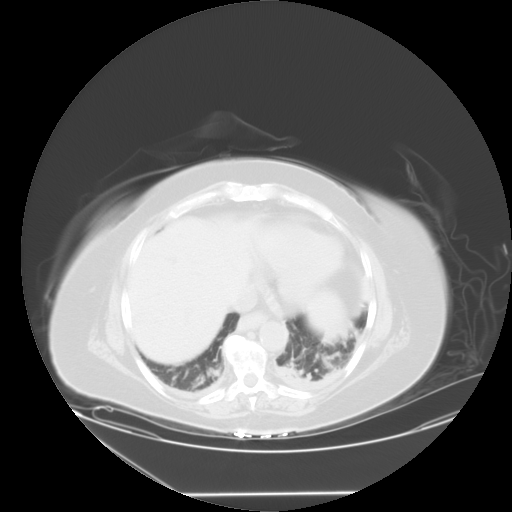
[im 96/110  lung]
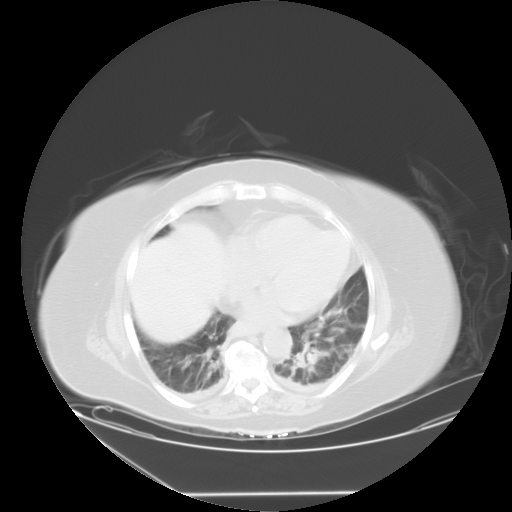
[im 101/110  soft-tissue]
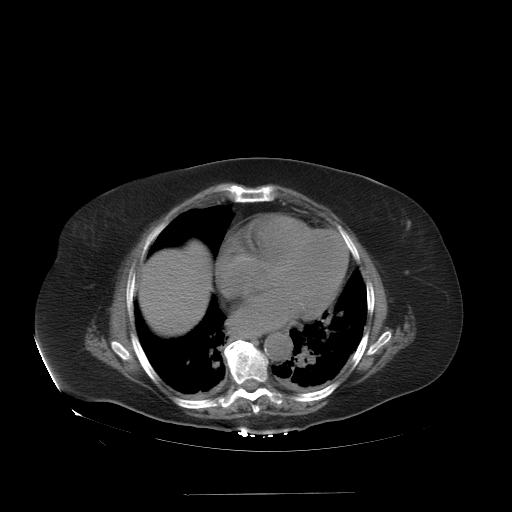
[im 101/110  lung]
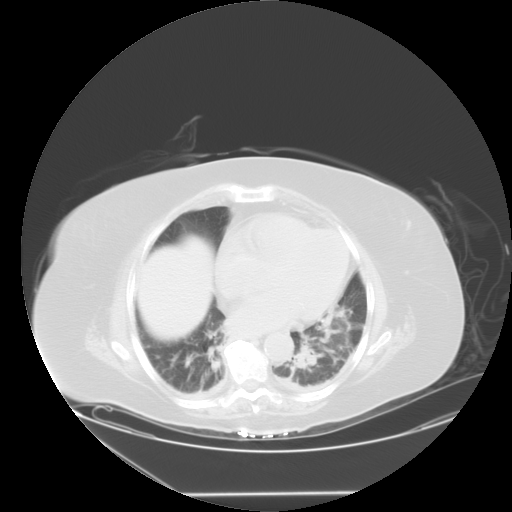
[im 105/110  lung]
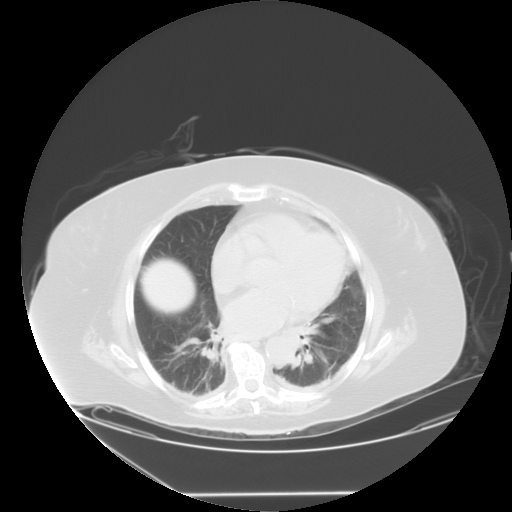

[13 of 36 positions shown; findings below may reference images not displayed]

FINDINGS: The lack of intravenous contrast limits the ability to
evaluate solid abdominal organs. Examination is further degraded
secondary to respiratory artifact.

Normal hepatic contour.  High attenuation material within an
otherwise normal-appearing gallbladder favored to represent
layering stones or sludge.  No definite gallbladder wall thickening
or pericholecystic fluid on this noncontrast examination.  No
ascites.

There is an approximately 4 mm stone within the superior aspect of
the left ureter, immediately caudal to the left UPJ (coronal image
58, series 401).  This finding is without associated upstream
pelvicaliectasis.  No additional stones are seen within the left
ureter.  Incidental note is made of a phlebolith adjacent to the
distal aspect of the left ureter (image 68, series 2).  There is an
approximately 5 mm nonobstructing stone within the inferior pole of
the left kidney (coronal image 63, series 401), unchanged.  No
right-sided renal stones.  No perinephric stranding.  There is mild
bilateral renal cortical thinning compatible with history of renal
insufficiency.

Normal noncontrast appearance of the bilateral adrenal glands,
pancreas and spleen.

Moderate to large colonic stool barium without evidence of
obstruction.  The appendix is not visualized, however there is no
inflammatory change within the right lower abdominal quadrant.  A
minimal amount of ingested enteric contrast extends to the level of
the distal small bowel.  No pneumoperitoneum, pneumatosis or portal
venous gas.

Scattered minimal atherosclerotic calcifications within a normal
caliber abdominal aorta.  Scattered shoddy porta hepatis,
retroperitoneal,, pelvic and inguinal lymph nodes are not enlarged
by CT criteria and pursue to the reactive in etiology.

Post hysterectomy.  No discrete adnexal mass.  No free fluid in the
pelvis.

Limited visualization of the lower thorax demonstrates asymmetric
heterogeneous air space opacities within the imaged left lower lung
with air bronchograms (image 7).  Trace bilateral pleural
effusions.  Cardiomegaly.  Calcifications of the mitral valve
annulus.  There is mild pericardial fluid, presumably physiologic.

No acute or aggressive osseous abnormalities.  Lumbar spine
degenerative change. Tiny mesenteric fat containing periumbilical
hernia.
IMPRESSION: 1.  Approximately 4 mm stone within the superior aspect of the left
ureter, just caudal to the left UPJ without associated upstream
pelvocaliectasis or perinephric stranding.
2.  Unchanged 5 mm nonobstructing stone within the inferior pole of
the left kidney.
3.  Moderate to large colonic stool burden without evidence of
enteric obstruction.
4.  Cholelithiasis without definite evidence of cholecystitis.

5.  Cardiomegaly.

6.  Asymmetric left lower lobe air space opacities with
bronchograms, possibly atelectasis, though worrisome for infection.

## 2013-10-31 IMAGING — CR DG CHEST 1V PORT
1 series · 1 of 1 positions shown · non-contrast
Comparison: 02/01/2012; 07/06/2011

CLINICAL DATA: CHF, shortness of breath, weakness

PORTABLE CHEST - 1 VIEW

[AP]
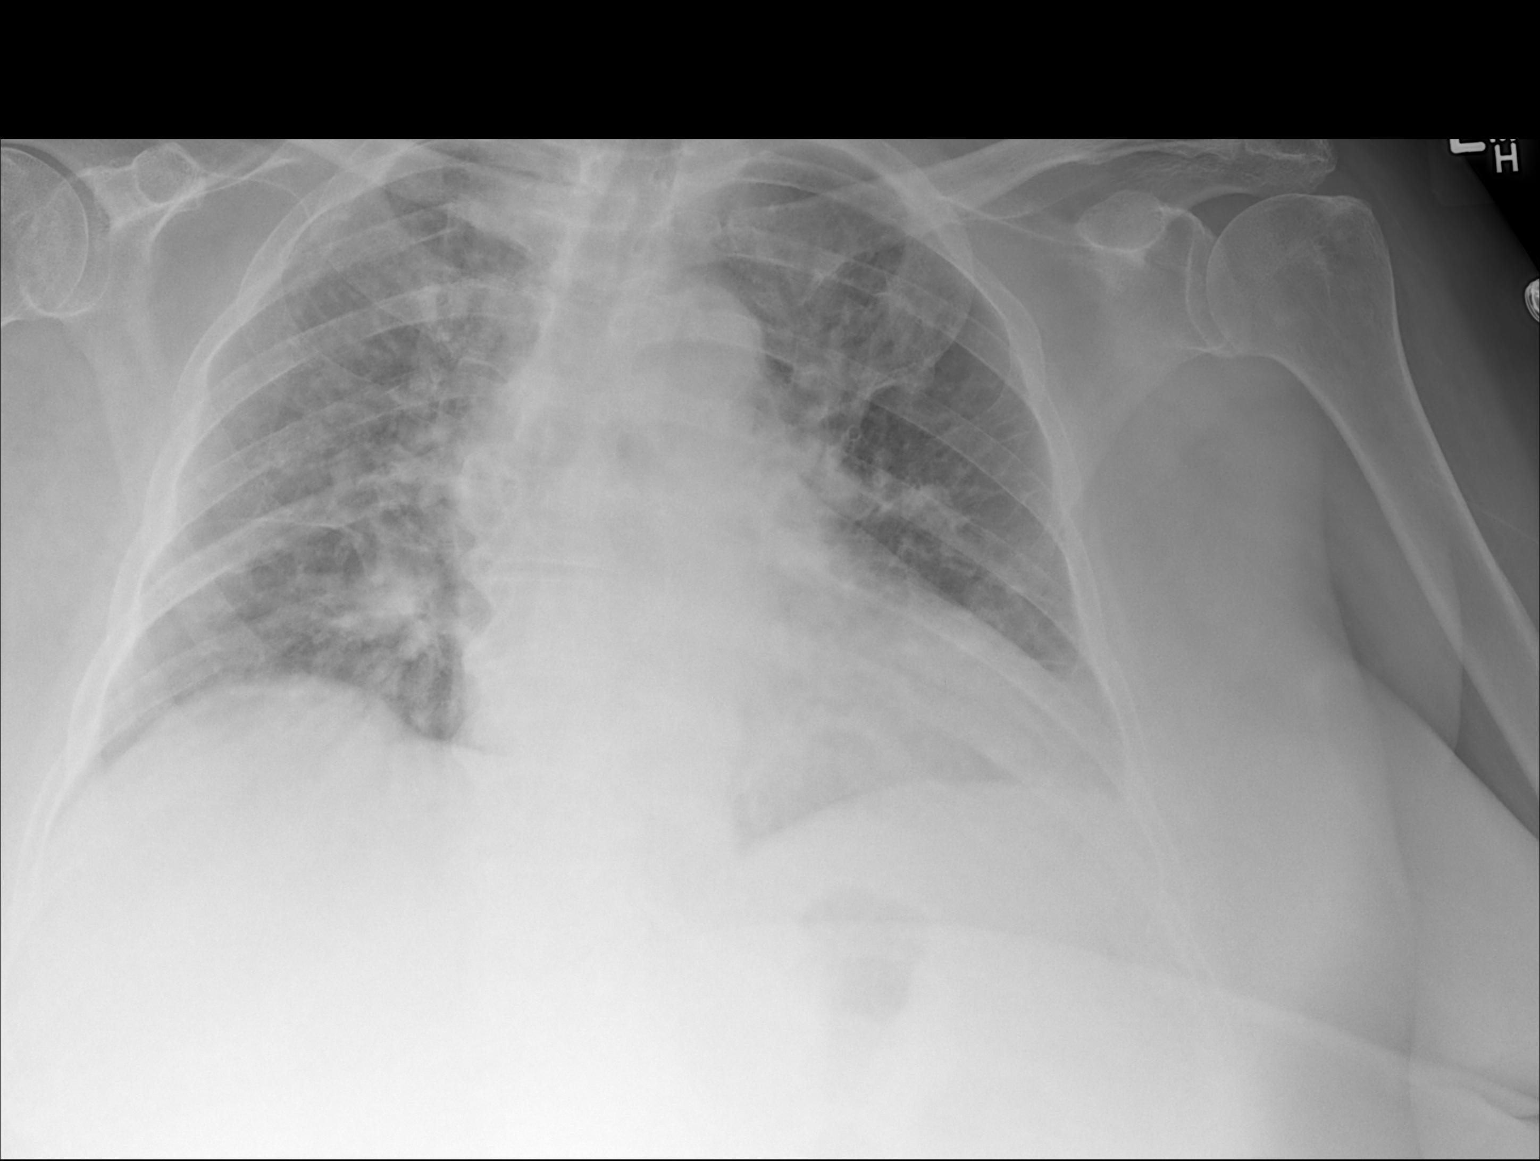

[1 of 1 positions shown; findings below may reference images not displayed]

FINDINGS: Grossly unchanged enlarged cardiac silhouette and mediastinal
contours.  Pulmonary vasculature is indistinct with cephalization
of flow.  Grossly unchanged perihilar and bibasilar heterogeneous
opacities.  No definite pleural effusion or pneumothorax.
Unchanged bones.
IMPRESSION: 1.  Findings compatible with pulmonary edema.

2.  Bibasilar and perihilar opacities favored to represent
atelectasis, though underlying infection not excluded.  Continued
attention on follow-up is recommended.

## 2013-11-09 ENCOUNTER — Ambulatory Visit
Admit: 2013-11-09 | Disposition: A | Discharge status: Home / Self Care | Payer: Self-pay | Attending: Nurse Practitioner | Admitting: Nurse Practitioner

## 2014-04-11 ENCOUNTER — Ambulatory Visit
Admit: 2014-04-11 | Disposition: A | Discharge status: Home / Self Care | Payer: Self-pay | Attending: Nurse Practitioner | Admitting: Nurse Practitioner

## 2014-08-29 NOTE — H&P (Signed)
PATIENT NAME:  Tina [name], COPENHAVER MR#:  [id number] DATE OF BIRTH:  03-26-1941  DATE OF ADMISSION:  01/10/2012  PRIMARY CARE PHYSICIAN: Lorelee Market, MD  CHIEF COMPLAINT: Bright red blood per rectum.  HISTORY OF PRESENT ILLNESS: This is a very pleasant 74 year old female with a history of atrial fibrillation on Xarelto, diabetes, hypertension, and hyperlipidemia who presents with the above complaint. Since 11:30 this morning, the patient has had bright red blood per rectum, numerous bowel movements with bright red blood. She has also been having abdominal pain specifically in the epigastric and right upper quadrant radiating to the back. This has been going on for about a month. She had an ultrasound a couple of weeks ago which showed some gallstones. She was planned for cholecystectomy two weeks ago; however, due to her heart rhythm the surgery was placed on hold. The patient does state that occasionally she has had some dark-colored stools and has felt chills. She denies any chest pain, shortness of breath, headache, or vision changes. No NSAID use except for Xarelto. No dizziness.  REVIEW OF SYSTEMS: CONSTITUTIONAL: No fever. Positive fatigue. No weakness, weight loss, or weight gain. EYES: No blurred or double vision.  ENT: No ear pain, hearing loss, seasonal allergies, or postnasal drip. RESPIRATORY: No cough, wheezing, hemoptysis, or chronic obstructive pulmonary disease. CARDIOVASCULAR: No chest pain, orthopnea, palpitations, syncope, edema, arrhythmia, or dyspnea on exertion. GASTROINTESTINAL: No nausea or vomiting. Positive bloody diarrhea. Positive abdominal pain, mostly in the epigastric region and right upper quadrant radiating to the back for the past month. Positive bright red blood per rectum and occasional melena over the past few days. GENITOURINARY: No dysuria or hematuria. ENDOCRINE: No polyuria or polydipsia. HEME/LYMPH: Positive easy bruising and anemia. SKIN: No rash or lesions.  MUSCULOSKELETAL: No gout or swelling. NEURO: Positive history of TIA. No CVA, ataxia or vertigo. PSYCH: Positive anxiety and depression.   PAST MEDICAL HISTORY:  1. Atrial fibrillation, on Xarelto. 2. Hyperlipidemia.  3. Diabetes.  4. Diabetic neuropathy.  5. History of transient ischemic attack.   MEDICATIONS:   1. Xarelto 15 mg at bedtime.  2. Vitamin D 1000 international units daily.  3. Tramadol 50 mg every 4 hours p.r.n.  4. Temazepam 15 mg at bedtime.  5. Simvastatin 40 mg at bedtime.  6. Protonix 40 mg daily.  7. Zofran 4 mg every 6 hours p.r.n.  8. NovoLog 6 units three times daily before meals.  9. Meclizine 12.5 mg three times daily. 10. Lyrica 75 mg twice a day. 11. Lantus Solo Pen 24 units at bedtime.  12. Flexeril 10 mg twice a day. 13. Enalapril 20 mg twice a day.  14. Diltiazem 240 mg daily.  15. Citalopram 20 mg daily.  16. Xanax 0.5 mg twice a day p.r.n.   ALLERGIES: Morphine causes nausea.   SOCIAL HISTORY: No tobacco, alcohol, or drug use.  FAMILY HISTORY: Positive for coronary artery disease.   PAST SURGICAL HISTORY:  1. Hysterectomy.  2. Knee replacement.   PHYSICAL EXAMINATION:   VITAL SIGNS: Temperature 98.2, pulse 98, respirations 18, blood pressure 132/56, and saturating 98% on room air.   GENERAL: The patient is alert and oriented, not in acute distress.   HEENT: Head is atraumatic. Pupils are round. Sclerae anicteric. Mucous membranes are moist. Oropharynx is clear.   NECK: Supple without jugular venous distention, carotid bruit, or enlarged thyroid.   CARDIOVASCULAR: Regular rate and rhythm. No murmurs, gallops, or rubs. PMI is not displaced.   LUNGS: Clear to auscultation  bilaterally without crackles, rales, rhonchi, or wheezing. Normal to percussion.   ABDOMEN: The patient has significant tenderness in the epigastric region without rebound or guarding. Bowel sounds are positive.   EXTREMITIES: No clubbing, cyanosis, or edema.    NEURO: Cranial nerves II through XII are intact. No focal deficits.   SKIN: No rash or lesions.  MUSCULOSKELETAL: The patient is able to move all extremities.   LABORATORY DATA: White blood cells 12.6, hemoglobin 12.2, hematocrit 36.7, and platelets 172. Lipase 77. Sodium 134, potassium 4.0, chloride 99, bicarbonate 25, BUN 47, creatinine 3.46, glucose 458, calcium 8.4, bilirubin 0.7, alkaline phosphatase 207, ALT 16, AST 30, total protein 6.8, and albumin 3.2. Anion gap 10. INR 1.3.   RADIOLOGIC DATA: CT of the abdomen shows possible mild colitis, no other abnormalities   ASSESSMENT AND PLAN: This is a 74 year old female with a history of atrial fibrillation, on Xarelto, hypertension and diabetes who presents with bright red blood per rectum.  1. Bright red blood per rectum. I am highly suspicious that this could be an upper gastrointestinal etiology as the patient is having midepigastric pain radiating to her back. Her vitals are stable at this time. She will be admitted to telemetry on IV Protonix. Gastroenterology has been consulted. We will obviously hold any kind of NSAIDs as well as Xarelto. Monitor hemoglobin closely. I will also go ahead and order a GI bleeding scan to evaluate for source of bleeding.  2. Abdominal pain. The patient has had abdominal pain for the past few weeks thought to be due to gallstones. This certainly could be the etiology since she is also having some right upper quadrant pain, however, she also notes that she has mid epigastric pain radiating to the back, once again significant for peptic ulcer. No evidence of free air on the CAT scan or anything to suggest perforation or possible peptic ulcer. We will go ahead and continue pain control and get gastroenterology consult.  3. Acute renal failure. This is likely secondary to some dehydration as well as ACE inhibitor use. We will provide some IV fluids, repeat the BMP in the a.m., and if creatinine is still elevated we  may consider further studies such as a renal ultrasound. 4. Hyponatremia which is mild and likely secondary to dehydration. We will provide IV fluids and monitor.  5. Diabetes. We will hold medications and provide sliding scale insulin.  The patient is NPO.  6. Hypertension. We will continue diltiazem and hold ACE inhibitor due to acute renal failure. Her blood pressure seems to be stable at this time. If she has active bleeding, we will need to discontinue the blood pressure medications, however, right now currently her blood pressure is stable.  7. Depression and anxiety. We will continue outpatient medications.  8. The patient is a LIMITED CODE. Okay for CPR and defibrillation however no ventilation.   TIME SPENT: Approximately 40 minutes.  ____________________________ Donell Beers. Benjie Karvonen, MD spm:slb D: 01/10/2012 00:20:57 ET T: 01/10/2012 08:40:06 ET JOB#: [id number]  cc: Sanayah Munro P. Benjie Karvonen, MD, <Dictator> Meindert A. Brunetta Genera, MD Donell Beers Weldon Nouri MD ELECTRONICALLY SIGNED 01/14/2012 21:11

## 2014-08-29 NOTE — Consult Note (Signed)
Chief Complaint:   Subjective/Chief Complaint less abdominal pain today, only one small episode of rectal bleeding   VITAL SIGNS/ANCILLARY NOTES: **Vital Signs.:   01-Sep-13 11:41   Vital Signs Type Routine   Temperature Temperature (F) 98.6   Celsius 37   Temperature Source oral   Pulse Pulse 82   Respirations Respirations 20   Systolic BP Systolic BP 364   Diastolic BP (mmHg) Diastolic BP (mmHg) 57   Mean BP 71   Pulse Ox % Pulse Ox % 92   Pulse Ox Activity Level  At rest   Oxygen Delivery Room Air/ 21 %   Brief Assessment:   Cardiac Irregular    Respiratory clear BS    Gastrointestinal details normal Soft  Nondistended  No rebound tenderness  mild generalized pain to palpation   Lab Results: Routine BB:  01-Sep-13 05:54    ABO Group + Rh Type A Positive   Antibody Screen NEGATIVE (Result(s) reported on 11 Jan 2012 at 08:18AM.)  Routine Chem:  01-Sep-13 03:46    Glucose, Serum  119   BUN  35   Creatinine (comp)  2.74   Sodium, Serum 145   Potassium, Serum  3.2   Chloride, Serum  111   CO2, Serum 25   Calcium (Total), Serum  7.6   Anion Gap 9   Osmolality (calc) 298   eGFR (African American)  20   eGFR (Non-African American)  17 (eGFR values <21m/min/1.73 m2 may be an indication of chronic kidney disease (CKD). Calculated eGFR is useful in patients with stable renal function. The eGFR calculation will not be reliable in acutely ill patients when serum creatinine is changing rapidly. It is not useful in  patients on dialysis. The eGFR calculation may not be applicable to patients at the low and high extremes of body sizes, pregnant women, and vegetarians.)  Routine Hem:  01-Sep-13 03:46    Hemoglobin (CBC)  9.8   WBC (CBC) 7.9   RBC (CBC)  3.51   Hematocrit (CBC)  29.0   Platelet Count (CBC)  135   MCV 83   MCH 28.1   MCHC 34.0   RDW 14.3   Neutrophil % 69.8   Lymphocyte % 20.6   Monocyte % 6.8   Eosinophil % 2.4   Basophil % 0.4   Neutrophil #  5.5   Lymphocyte # 1.6   Monocyte # 0.5   Eosinophil # 0.2   Basophil # 0.0 (Result(s) reported on 11 Jan 2012 at 05:32AM.)    09:50    Hemoglobin (CBC)  9.8 (Result(s) reported on 11 Jan 2012 at 10:13AM.)   Assessment/Plan:  Assessment/Plan:   Assessment 1) rectal bleeding, diarrheal illness.  colitis by ct.  DDX includes infective, inflammatory and ischemic.  some improvement. 2) multiple medical problems with AF (was on xarelto), htn, dm.    Plan 1) will arrange for flexible sigmoidoscopy possibly with light sedation for tomorrow. I have discussed the risks benefits and complications of flexible sigmoidoscopy to include not limited to bleeding infection perofration and sedation and she wishes to proceed.   Electronic Signatures: SLoistine Simas(MD)  (Signed 01-Sep-13 12:01)  Authored: Chief Complaint, VITAL SIGNS/ANCILLARY NOTES, Brief Assessment, Lab Results, Assessment/Plan   Last Updated: 01-Sep-13 12:01 by SLoistine Simas(MD)

## 2014-08-29 NOTE — Consult Note (Signed)
Brief Consult Note: Diagnosis: diarrheal illness, rectal bleeding.   Patient was seen by consultant.   Consult note dictated.   Recommend further assessment or treatment.   Orders entered.   Comments: Please see full GI consult [phone] and [phone].  Patient admitted with several days of diarrhea followed with rectal bleeding beginning yesterday am.  Of note she is on xarelto for AF.  Currently with mucousy/bloody stool, impression of infective diarrhea/colitis. CT done without contrast due to renal insufficiency, therefore unable to evaluate re ischemic colitis/mesenteric insufficiency without luminal evaluation.  Continue abx as you are.  Patient has renal insufficiency and the effect of the xarelto will take at least 2 days to wash out.  Patient had recent colonoscopy at [facility], will try to get those records. Following..  Electronic Signatures: Loistine Simas (MD)  (Signed 31-Aug-13 15:53)  Authored: Brief Consult Note   Last Updated: 31-Aug-13 15:53 by Loistine Simas (MD)

## 2014-08-29 NOTE — Consult Note (Signed)
PATIENT NAME:  Tina [name], Tina [name] MR#:  [id number] DATE OF BIRTH:  07/02/1940  DATE OF CONSULTATION:  01/10/2012  REFERRING PHYSICIAN:   CONSULTING PHYSICIAN:  Lollie Sails, MD  ADDENDUM:   It is of note that the patient has been on Xarelto for atrial fibrillation, this agent recently started. CT scan was done without contrast due to renal insufficiency and, therefore, unable to evaluate in regards to mesenteric insufficiency without luminal evaluation. However, clinically, this appears much more so an infective diarrhea exacerbated/worsened with an anticoagulant. The patient is currently on antibiotics. It will take at least two days for the Xarelto to wash out due to her renal insufficiency. The patient did have a recent colonoscopy at [facility] and we will try to get those charts.   ____________________________ Lollie Sails, MD mus:ap D: 01/10/2012 15:52:59 ET T: 01/11/2012 08:19:04 ET JOB#: [id number]  cc: Lollie Sails, MD, <Dictator> Lollie Sails MD ELECTRONICALLY SIGNED 02/02/2012 9:04

## 2014-08-29 NOTE — Consult Note (Signed)
Chief Complaint:   Subjective/Chief Complaint Please see flex sig report.  Appearance of ischemic colitis.  Biopsies taken.  Finish 7-10 day course of antibiotics.  Unable to do CTA due to CKD.  Will order ATIII, anticardiolipin abs, protien c+s, leiden factor V.  Will need GI fu in 3-4 weeks.   Electronic Signatures: Loistine Simas (MD)  (Signed 02-Sep-13 11:46)  Authored: Chief Complaint   Last Updated: 02-Sep-13 11:46 by Loistine Simas (MD)

## 2014-08-29 NOTE — Consult Note (Signed)
Chief Complaint:   Subjective/Chief Complaint less nausea, less abdominal pain, no bm overnight , tolerated enemas.   VITAL SIGNS/ANCILLARY NOTES: **Vital Signs.:   02-Sep-13 04:07   Vital Signs Type Routine   Temperature Temperature (F) 98.4   Celsius 36.8   Temperature Source Oral   Pulse Pulse 86   Respirations Respirations 20   Systolic BP Systolic BP 702   Diastolic BP (mmHg) Diastolic BP (mmHg) 68   Mean BP 87   Pulse Ox % Pulse Ox % 91   Pulse Ox Activity Level  At rest   Oxygen Delivery Room Air/ 21 %    08:21   Pulse Pulse 78   Systolic BP Systolic BP 637   Diastolic BP (mmHg) Diastolic BP (mmHg) 60   Mean BP 80  *Intake and Output.:   02-Sep-13 09:16   Stool  med. loose   Brief Assessment:   Cardiac Regular    Respiratory clear BS    Gastrointestinal details normal Soft  Nondistended  No masses palpable  Bowel sounds normal  No rebound tenderness  mild diffuse tenderness, less in the llq than otherwise.   Lab Results: Routine Chem:  02-Sep-13 06:44    Glucose, Serum  165   BUN  21   Creatinine (comp)  2.10   Sodium, Serum 143   Potassium, Serum 3.5   Chloride, Serum  110   CO2, Serum 24   Calcium (Total), Serum  7.6   Anion Gap 9   Osmolality (calc) 292   eGFR (African American)  27   eGFR (Non-African American)  23 (eGFR values <73mL/min/1.73 m2 may be an indication of chronic kidney disease (CKD). Calculated eGFR is useful in patients with stable renal function. The eGFR calculation will not be reliable in acutely ill patients when serum creatinine is changing rapidly. It is not useful in  patients on dialysis. The eGFR calculation may not be applicable to patients at the low and high extremes of body sizes, pregnant women, and vegetarians.)  Routine Coag:  30-Aug-13 18:45    INR 1.3 (INR reference interval applies to patients on anticoagulant therapy. A single INR therapeutic range for coumarins is not optimal for all indications; however,  the suggested range for most indications is 2.0 - 3.0. Exceptions to the INR Reference Range may include: Prosthetic heart valves, acute myocardial infarction, prevention of myocardial infarction, and combinations of aspirin and anticoagulant. The need for a higher or lower target INR must be assessed individually. Reference: The Pharmacology and Management of the Vitamin K  antagonists: the seventh ACCP Conference on Antithrombotic and Thrombolytic Therapy. CHYIF.0277 Sept:126 (3suppl): [id number]. A HCT value >55% may artifactually increase the PT.  In one study,  the increase was an average of 25%. Reference:  "Effect on Routine and Special Coagulation Testing Values of Citrate Anticoagulant Adjustment in Patients with High HCT Values." American Journal of Clinical Pathology 2006;126:400-405.)  Routine Hem:  02-Sep-13 06:44    WBC (CBC) 7.1   RBC (CBC)  3.59   Hemoglobin (CBC)  10.1   Hematocrit (CBC)  30.0   Platelet Count (CBC)  147   MCV 84   MCH 28.1   MCHC 33.7   RDW 14.0   Neutrophil % 73.1   Lymphocyte % 16.2   Monocyte % 7.1   Eosinophil % 3.2   Basophil % 0.4   Neutrophil # 5.2   Lymphocyte # 1.1   Monocyte # 0.5   Eosinophil # 0.2   Basophil #  0.0 (Result(s) reported on 12 Jan 2012 at 07:11AM.)   Assessment/Plan:  Assessment/Plan:   Assessment 1) abdominal pain and rectal bleeding-no bleeding for 24 hours, abd pain improving.  impression possible ischemic versus infective colitis.  CT noted, on abs.  2) h/o A-Fib, was on xarelto, now held.    Plan 1) flexible sigmoidoscopy today.  I have discussed the risks benefits and complications of flex sig to include not limited to bleeding infection perforation and sedation and she wishes to proceed.  further recs to follow.   Electronic Signatures: Loistine Simas (MD)  (Signed 02-Sep-13 10:30)  Authored: Chief Complaint, VITAL SIGNS/ANCILLARY NOTES, Brief Assessment, Lab Results, Assessment/Plan   Last Updated:  02-Sep-13 10:30 by Loistine Simas (MD)

## 2014-08-29 NOTE — Consult Note (Signed)
PATIENT NAME:  Tina [name], HOFMEISTER MR#:  [id number] DATE OF BIRTH:  17-Jun-1940  DATE OF CONSULTATION:  01/10/2012  REFERRING PHYSICIAN:   CONSULTING PHYSICIAN:  Lollie Sails, MD  REASON FOR CONSULTATION: Rectal bleeding.   HISTORY OF PRESENT ILLNESS: Ms. Adelstein is a 74 year old Caucasian female who came to the Emergency Room yesterday with complaint of rectal bleeding. She stated that in the morning she noted some rectal bleeding. She has been having problems with diarrhea, however, for three days previously. She had not seen any bleeding until yesterday morning. There has been no history of previous rectal bleeding in the past. There has been some mild nausea over the past several days, but no vomiting. There has been some lower abdominal pain, this starting yesterday when the bleeding started. She is not taking any recent antibiotics. However, was on some antibiotics for sinus infection about two months ago. She lives by herself. She has never had peptic ulcer disease. She was admitted to the hospital in February 2013 at [facility]. At that time she was hospitalized with a rapid heartbeat, later told that she had had a mini stroke or concern for this. She was placed on a blood thinner at the time. She also underwent colonoscopy during that hospitalization. I do not have any of those records. Currently, her pain is about the same as when she came into the hospital to perhaps slightly better, more so centered in the left upper quadrant and left lower quadrant. Gastrointestinal family history is negative for colorectal cancer, liver disease, or ulcers. She has no dysphagia or heartburn problems. The patient did undergo CT scanning as well as a gastrointestinal bleeding scan while being seen in the Emergency Room. Her CT scanning showing a mild colitis extending from the splenic flexure region. Her bleeding scan which was done this morning showed no evidence of active gastrointestinal hemorrhage.  There were no abnormal foci of radiotracer activity appreciated within the expected course of the gastrointestinal tract.   PAST MEDICAL HISTORY:  1. Recent diagnosis of atrial fibrillation, on Xarelto.  2. History of diabetic neuropathy and diabetes, insulin-dependent.  3. History of hyperlipidemia.  4. She did have a stool for C. difficile collected earlier this afternoon which was negative.   PAST SURGICAL HISTORY:    1. Hysterectomy. 2. Knee replacement.   SOCIAL HISTORY: She does not smoke. She does not drink. She does not use any drugs.   PHYSICAL EXAMINATION:  VITAL SIGNS: Temperature 98.3, pulse 85, respirations 18, blood pressure 113/64, pulse oximetry 91%.   GENERAL: She is a 74 year old Caucasian female in no acute distress.   HEENT: Normocephalic, atraumatic.   EYES: Anicteric.   NOSE: Septum midline.   OROPHARYNX: No lesions.   NECK: No JVD.   HEART: Irregularly irregular.   LUNGS: Clear.   ABDOMEN: Protuberant, soft, tender to palpation in the left upper quadrant extending toward the left lower quadrant. There is some mild discomfort as well extending toward the right upper quadrant. Less so pain in the center of the abdomen and right lower quadrant. There are no masses or rebound. There is no organomegaly.   RECTAL: Anorectal examination shows a bloody though mucousy effluent that is medium red in color. There was no stool.   EXTREMITIES: No clubbing, cyanosis, or edema.   NEUROLOGICAL: Cranial nerves II through XII grossly intact.   LABORATORY, DIAGNOSTIC, AND RADIOLOGICAL DATA: On admission to the hospital she had a glucose 458, BUN 47, creatinine 3.46, sodium 134, potassium 4.0, chloride  99, bicarbonate 25, calcium 8.4, lipase 77. Hepatic profile shows a total protein of 6.8, albumin 3.2, total bilirubin 0.7, alkaline phosphatase 207, AST and ALT 30 and 16 respectively. Her hemogram showed a white count of 12.6, hemoglobin and hematocrit of 12.2/36.7,  platelet count 172, MCV 85. Her ProTime was 16.4, INR 1.3. She has had two repeat hemoglobins since admission, one very early this morning at 10.8 and one this afternoon at 10.1. Her urinalysis showed 2+ blood, negative for nitrite, negative leukocyte esterase. Imaging studies as above.   ASSESSMENT: Rectal bleeding, this is most likely infective colitis. Stool cultures have not been obtained although a C. difficile was negative. She is on antibiotics, Cipro, as well as metronidazole. Gastrointestinal bleeding scan is negative. Therefore, less likely diverticular or AVMs. Although this is possibly ischemic, there was a diarrheal prodrome making it more likely infective in nature. The patient has had a colonoscopy relatively recently and we will attempt to obtain those records from [facility].   RECOMMENDATIONS:  1. Continue antibiotics as you are.  2. Will obtain stool for culture and sensitivity.  3. Will start her on a limited clear liquid diet.  4. Will follow with you.    ____________________________ Lollie Sails, MD mus:ap D: 01/10/2012 15:41:10 ET T: 01/11/2012 08:02:42 ET JOB#: [id number]  cc: Lollie Sails, MD, <Dictator> Lollie Sails MD ELECTRONICALLY SIGNED 02/02/2012 9:04

## 2014-08-29 NOTE — Discharge Summary (Signed)
PATIENT NAME:  Tina [name], Tina [name] MR#:  [id number] DATE OF BIRTH:  11/26/1940  DATE OF ADMISSION:  01/10/2012 DATE OF DISCHARGE:  01/12/2012  DISCHARGE DIAGNOSES:  1. Bright red blood per rectum.  2. Acute blood loss anemia.  3. Infectious versus ischemic colitis.  4. Acute renal failure over chronic kidney disease.    CONSULTANT: Dr. Gustavo Lah of GI.   PROCEDURE: Flexible sigmoidoscopy which showed colitis concerning for ischemia.   Patient discharged and examined on 01/12/2012  ADMITTING HISTORY AND PHYSICAL: Please see detailed history and physical dictated on 01/10/2012. In brief, 74 year old female patient with history of atrial fibrillation on Xarelto, diabetes, hypertension, hyperlipidemia presented to the Emergency Room complaining of bright red blood per rectum. Patient was admitted for GI bleed for possible colonoscopy/EGD after consulting GI.   HOSPITAL COURSE:  1. Bright red blood per rectum. Patient was admitted on the telemetry floor for close monitoring with multiple CBCs checked. Patient did have her hemoglobin dropped from 12 to 10 but was stable, did not need any blood transfusions and on the day of discharge did not have any further bleeding. Patient was seen by Dr. Gustavo Lah of GI and had a flexible sigmoidoscopy done which showed signs of colitis concerning for ischemic colitis but secondary to patient's chronic kidney disease a CT angiogram could not be done. Patient has been started on ciprofloxacin, metronidazole. She will be continued for a total of 10 days and follow up in the GI for further work-up of possible ischemic colitis. Patient was on Xarelto at home, which has been stopped at this time. It has been discussed with the patient regarding increased risk of cerebrovascular accident as Xarelto has been stopped but considering the acute GI bleed the Xarelto was stopped and needs to be started after her visit with her primary care physician if there is no further bleeding.   2. Acute blood loss anemia. Patient did have anemia from the acute blood loss with GI but has been stable over the last 48 hours of hospital stay.  3. Acute renal failure on chronic kidney disease. Patient did have mild elevation in her BUN/creatinine so as to acute renal failure which resolved with IV fluids.  4. Paroxysmal atrial fibrillation. Patient continues to be in normal sinus rhythm on the day of discharge with heart rate of 72. Xarelto has been stopped secondary to the GI bleed. Continue other medications.  5. On the day of discharge patient had some mild lower abdominal pain and tenderness. No rigidity or guarding. Bowel sounds were present with no further bright red blood per rectum. Vitals showed blood pressure 139/77, heart rate of 78, saturating 94% on room air and patient was discharged home in a stable condition.   DISCHARGE MEDICATIONS:  1. Meclizine 12.5 mg oral 3 times a day as needed.  2. Lyrica 75 mg orally two times a day.  3. Alprazolam 0.5 mg orally 2 times a day as needed.  4. Vitamin D 1000 international units 1 tablet oral daily.  5. Citalopram 20 mg oral once a day.  6. Diltiazem 240 mg orally once a day.  7. Lantus 24 units at bedtime.  8. NovoLog FlexPen sliding scale.  9. Protonix 40 mg oral once a day.  10. Temazepam 50 mg oral once a day.  11. Simvastatin 40 mg oral daily.  12. Enalapril 20 mg oral 2 times a day.  13. Flexeril 10 mg oral 2 times a day.  14. Ondansetron 4 mg oral every six hours  as needed for nausea, vomiting.  15. Tramadol 50 mg orally every eight hours as needed for pain.  16. Flagyl 500 mg oral 3 times a day for one week.  17. Ciprofloxacin 250 mg orally 2 times a day for one week.  18. Xarelto 50 mg oral once a day has been held.   DISCHARGE INSTRUCTIONS: Follow up with primary care physician within a week and GI, Dr. Gustavo Lah, in two weeks. Patient will be on a diabetic diet of regular consistency. Patient needs to return to the  Emergency Room if she has any further bleeding or worsening of her abdominal pain. This plan was discussed with the patient who has verbalized understanding and is okay with the plan.      TIME SPENT: Time on the day of discharge in coordination of care was 35 minutes.  ____________________________ Leia Alf Ermelinda Eckert, MD srs:cms D: 01/15/2012 13:58:32 ET T: 01/16/2012 12:25:49 ET JOB#: [id number]  cc: Alveta Heimlich R. Mykia Holton, MD, <Dictator> Meindert A. Brunetta Genera, MD Lollie Sails, MD  Neita Carp MD ELECTRONICALLY SIGNED 01/16/2012 14:19

## 2014-10-11 ENCOUNTER — Ambulatory Visit: Admit: 2014-10-11 | Payer: Self-pay | Admitting: Nurse Practitioner

## 2016-02-19 DIAGNOSIS — G8250 Quadriplegia, unspecified: Secondary | ICD-10-CM

## 2016-02-19 DIAGNOSIS — E1122 Type 2 diabetes mellitus with diabetic chronic kidney disease: Secondary | ICD-10-CM

## 2016-02-19 DIAGNOSIS — N189 Chronic kidney disease, unspecified: Secondary | ICD-10-CM

## 2016-02-19 DIAGNOSIS — Z515 Encounter for palliative care: Secondary | ICD-10-CM

## 2016-02-19 DIAGNOSIS — E1121 Type 2 diabetes mellitus with diabetic nephropathy: Secondary | ICD-10-CM

## 2016-02-19 DIAGNOSIS — F39 Unspecified mood [affective] disorder: Secondary | ICD-10-CM

## 2016-02-19 DIAGNOSIS — F0390 Unspecified dementia without behavioral disturbance: Secondary | ICD-10-CM

## 2016-02-19 DIAGNOSIS — Z8673 Personal history of transient ischemic attack (TIA), and cerebral infarction without residual deficits: Secondary | ICD-10-CM

## 2016-02-19 DIAGNOSIS — I482 Chronic atrial fibrillation: Secondary | ICD-10-CM

## 2016-02-19 DIAGNOSIS — E785 Hyperlipidemia, unspecified: Secondary | ICD-10-CM

## 2016-02-19 DIAGNOSIS — I739 Peripheral vascular disease, unspecified: Secondary | ICD-10-CM

## 2016-02-19 DIAGNOSIS — Z66 Do not resuscitate: Secondary | ICD-10-CM

## 2016-02-19 DIAGNOSIS — I429 Cardiomyopathy, unspecified: Secondary | ICD-10-CM

## 2016-06-07 ENCOUNTER — Encounter: Payer: Self-pay | Admitting: Emergency Medicine

## 2016-06-07 ENCOUNTER — Emergency Department: Payer: Medicare Other

## 2016-06-07 ENCOUNTER — Inpatient Hospital Stay
Admission: EM | Admit: 2016-06-07 | Discharge: 2016-06-13 | DRG: 194 | Disposition: A | Discharge status: Hospice | Payer: Medicare Other | Attending: Specialist | Admitting: Specialist

## 2016-06-07 DIAGNOSIS — G3183 Dementia with Lewy bodies: Secondary | ICD-10-CM | POA: Diagnosis present

## 2016-06-07 DIAGNOSIS — R509 Fever, unspecified: Secondary | ICD-10-CM

## 2016-06-07 DIAGNOSIS — R410 Disorientation, unspecified: Secondary | ICD-10-CM

## 2016-06-07 DIAGNOSIS — N390 Urinary tract infection, site not specified: Secondary | ICD-10-CM | POA: Diagnosis present

## 2016-06-07 DIAGNOSIS — Z66 Do not resuscitate: Secondary | ICD-10-CM

## 2016-06-07 DIAGNOSIS — E1122 Type 2 diabetes mellitus with diabetic chronic kidney disease: Secondary | ICD-10-CM | POA: Diagnosis present

## 2016-06-07 DIAGNOSIS — R0600 Dyspnea, unspecified: Secondary | ICD-10-CM

## 2016-06-07 DIAGNOSIS — Z90710 Acquired absence of both cervix and uterus: Secondary | ICD-10-CM

## 2016-06-07 DIAGNOSIS — Z6841 Body Mass Index (BMI) 40.0 and over, adult: Secondary | ICD-10-CM

## 2016-06-07 DIAGNOSIS — Y95 Nosocomial condition: Secondary | ICD-10-CM | POA: Diagnosis present

## 2016-06-07 DIAGNOSIS — Z7951 Long term (current) use of inhaled steroids: Secondary | ICD-10-CM

## 2016-06-07 DIAGNOSIS — E669 Obesity, unspecified: Secondary | ICD-10-CM | POA: Diagnosis present

## 2016-06-07 DIAGNOSIS — Z833 Family history of diabetes mellitus: Secondary | ICD-10-CM

## 2016-06-07 DIAGNOSIS — L8990 Pressure ulcer of unspecified site, unspecified stage: Secondary | ICD-10-CM | POA: Insufficient documentation

## 2016-06-07 DIAGNOSIS — F329 Major depressive disorder, single episode, unspecified: Secondary | ICD-10-CM | POA: Diagnosis present

## 2016-06-07 DIAGNOSIS — J440 Chronic obstructive pulmonary disease with acute lower respiratory infection: Secondary | ICD-10-CM | POA: Diagnosis present

## 2016-06-07 DIAGNOSIS — E785 Hyperlipidemia, unspecified: Secondary | ICD-10-CM | POA: Diagnosis present

## 2016-06-07 DIAGNOSIS — Z794 Long term (current) use of insulin: Secondary | ICD-10-CM

## 2016-06-07 DIAGNOSIS — Z515 Encounter for palliative care: Secondary | ICD-10-CM | POA: Diagnosis present

## 2016-06-07 DIAGNOSIS — G629 Polyneuropathy, unspecified: Secondary | ICD-10-CM | POA: Diagnosis present

## 2016-06-07 DIAGNOSIS — Z87442 Personal history of urinary calculi: Secondary | ICD-10-CM

## 2016-06-07 DIAGNOSIS — Z7189 Other specified counseling: Secondary | ICD-10-CM

## 2016-06-07 DIAGNOSIS — Z8673 Personal history of transient ischemic attack (TIA), and cerebral infarction without residual deficits: Secondary | ICD-10-CM

## 2016-06-07 DIAGNOSIS — L89322 Pressure ulcer of left buttock, stage 2: Secondary | ICD-10-CM | POA: Diagnosis present

## 2016-06-07 DIAGNOSIS — Z79899 Other long term (current) drug therapy: Secondary | ICD-10-CM

## 2016-06-07 DIAGNOSIS — Z8249 Family history of ischemic heart disease and other diseases of the circulatory system: Secondary | ICD-10-CM

## 2016-06-07 DIAGNOSIS — I130 Hypertensive heart and chronic kidney disease with heart failure and stage 1 through stage 4 chronic kidney disease, or unspecified chronic kidney disease: Secondary | ICD-10-CM | POA: Diagnosis present

## 2016-06-07 DIAGNOSIS — F0280 Dementia in other diseases classified elsewhere without behavioral disturbance: Secondary | ICD-10-CM | POA: Diagnosis present

## 2016-06-07 DIAGNOSIS — I129 Hypertensive chronic kidney disease with stage 1 through stage 4 chronic kidney disease, or unspecified chronic kidney disease: Secondary | ICD-10-CM | POA: Diagnosis present

## 2016-06-07 DIAGNOSIS — N183 Chronic kidney disease, stage 3 (moderate): Secondary | ICD-10-CM | POA: Diagnosis present

## 2016-06-07 DIAGNOSIS — J181 Lobar pneumonia, unspecified organism: Secondary | ICD-10-CM

## 2016-06-07 DIAGNOSIS — J189 Pneumonia, unspecified organism: Principal | ICD-10-CM | POA: Diagnosis present

## 2016-06-07 DIAGNOSIS — Z9981 Dependence on supplemental oxygen: Secondary | ICD-10-CM

## 2016-06-07 DIAGNOSIS — I5032 Chronic diastolic (congestive) heart failure: Secondary | ICD-10-CM | POA: Diagnosis present

## 2016-06-07 DIAGNOSIS — I4891 Unspecified atrial fibrillation: Secondary | ICD-10-CM | POA: Diagnosis present

## 2016-06-07 DIAGNOSIS — Z885 Allergy status to narcotic agent status: Secondary | ICD-10-CM

## 2016-06-07 DIAGNOSIS — K219 Gastro-esophageal reflux disease without esophagitis: Secondary | ICD-10-CM | POA: Diagnosis present

## 2016-06-07 DIAGNOSIS — R0602 Shortness of breath: Secondary | ICD-10-CM | POA: Diagnosis not present

## 2016-06-07 DIAGNOSIS — Z7401 Bed confinement status: Secondary | ICD-10-CM

## 2016-06-07 MED ORDER — IPRATROPIUM-ALBUTEROL 0.5-2.5 (3) MG/3ML IN SOLN
3.0000 mL | Freq: Once | RESPIRATORY_TRACT | Status: AC
  Administered 2016-06-07: 3 mL via RESPIRATORY_TRACT
  Filled 2016-06-07: qty 3

## 2016-06-07 MED ORDER — SODIUM CHLORIDE 0.9 % IV BOLUS (SEPSIS)
500.0000 mL | Freq: Once | INTRAVENOUS | Status: AC
  Administered 2016-06-07: 500 mL via INTRAVENOUS

## 2016-06-07 NOTE — ED Triage Notes (Signed)
Pt from [facility] with fever, shob, and "not feeling well today". Pt with audible wheezing noted that clears with coughing. Skin slightly hot and dry.

## 2016-06-08 DIAGNOSIS — L89322 Pressure ulcer of left buttock, stage 2: Secondary | ICD-10-CM | POA: Diagnosis present

## 2016-06-08 DIAGNOSIS — R0602 Shortness of breath: Secondary | ICD-10-CM | POA: Diagnosis present

## 2016-06-08 DIAGNOSIS — F0280 Dementia in other diseases classified elsewhere without behavioral disturbance: Secondary | ICD-10-CM | POA: Diagnosis present

## 2016-06-08 DIAGNOSIS — I5032 Chronic diastolic (congestive) heart failure: Secondary | ICD-10-CM | POA: Diagnosis present

## 2016-06-08 DIAGNOSIS — Z7401 Bed confinement status: Secondary | ICD-10-CM | POA: Diagnosis not present

## 2016-06-08 DIAGNOSIS — G3183 Dementia with Lewy bodies: Secondary | ICD-10-CM | POA: Diagnosis present

## 2016-06-08 DIAGNOSIS — E1122 Type 2 diabetes mellitus with diabetic chronic kidney disease: Secondary | ICD-10-CM | POA: Diagnosis present

## 2016-06-08 DIAGNOSIS — Z90710 Acquired absence of both cervix and uterus: Secondary | ICD-10-CM | POA: Diagnosis not present

## 2016-06-08 DIAGNOSIS — J189 Pneumonia, unspecified organism: Secondary | ICD-10-CM | POA: Diagnosis present

## 2016-06-08 DIAGNOSIS — L8990 Pressure ulcer of unspecified site, unspecified stage: Secondary | ICD-10-CM | POA: Insufficient documentation

## 2016-06-08 DIAGNOSIS — K219 Gastro-esophageal reflux disease without esophagitis: Secondary | ICD-10-CM | POA: Diagnosis present

## 2016-06-08 DIAGNOSIS — E669 Obesity, unspecified: Secondary | ICD-10-CM | POA: Diagnosis present

## 2016-06-08 DIAGNOSIS — F329 Major depressive disorder, single episode, unspecified: Secondary | ICD-10-CM | POA: Diagnosis present

## 2016-06-08 DIAGNOSIS — Y95 Nosocomial condition: Secondary | ICD-10-CM | POA: Diagnosis present

## 2016-06-08 DIAGNOSIS — E785 Hyperlipidemia, unspecified: Secondary | ICD-10-CM | POA: Diagnosis present

## 2016-06-08 DIAGNOSIS — J181 Lobar pneumonia, unspecified organism: Secondary | ICD-10-CM | POA: Diagnosis not present

## 2016-06-08 DIAGNOSIS — J440 Chronic obstructive pulmonary disease with acute lower respiratory infection: Secondary | ICD-10-CM | POA: Diagnosis present

## 2016-06-08 DIAGNOSIS — Z515 Encounter for palliative care: Secondary | ICD-10-CM | POA: Diagnosis present

## 2016-06-08 DIAGNOSIS — I129 Hypertensive chronic kidney disease with stage 1 through stage 4 chronic kidney disease, or unspecified chronic kidney disease: Secondary | ICD-10-CM | POA: Diagnosis present

## 2016-06-08 DIAGNOSIS — Z6841 Body Mass Index (BMI) 40.0 and over, adult: Secondary | ICD-10-CM | POA: Diagnosis not present

## 2016-06-08 DIAGNOSIS — G629 Polyneuropathy, unspecified: Secondary | ICD-10-CM | POA: Diagnosis present

## 2016-06-08 DIAGNOSIS — I4891 Unspecified atrial fibrillation: Secondary | ICD-10-CM | POA: Diagnosis present

## 2016-06-08 DIAGNOSIS — I130 Hypertensive heart and chronic kidney disease with heart failure and stage 1 through stage 4 chronic kidney disease, or unspecified chronic kidney disease: Secondary | ICD-10-CM | POA: Diagnosis present

## 2016-06-08 DIAGNOSIS — Z66 Do not resuscitate: Secondary | ICD-10-CM | POA: Diagnosis present

## 2016-06-08 DIAGNOSIS — Z8673 Personal history of transient ischemic attack (TIA), and cerebral infarction without residual deficits: Secondary | ICD-10-CM | POA: Diagnosis not present

## 2016-06-08 DIAGNOSIS — N390 Urinary tract infection, site not specified: Secondary | ICD-10-CM | POA: Diagnosis present

## 2016-06-08 DIAGNOSIS — Z7189 Other specified counseling: Secondary | ICD-10-CM | POA: Diagnosis not present

## 2016-06-08 DIAGNOSIS — N183 Chronic kidney disease, stage 3 (moderate): Secondary | ICD-10-CM | POA: Diagnosis present

## 2016-06-08 DIAGNOSIS — Z9981 Dependence on supplemental oxygen: Secondary | ICD-10-CM | POA: Diagnosis not present

## 2016-06-08 LAB — URINALYSIS, COMPLETE (UACMP) WITH MICROSCOPIC
Bilirubin Urine: NEGATIVE
Glucose, UA: NEGATIVE mg/dL
KETONES UR: NEGATIVE mg/dL
NITRITE: NEGATIVE
PH: 6 (ref 5.0–8.0)
Protein, ur: NEGATIVE mg/dL
SPECIFIC GRAVITY, URINE: 1.012 (ref 1.005–1.030)

## 2016-06-08 LAB — COMPREHENSIVE METABOLIC PANEL
ALBUMIN: 2.9 g/dL — AB (ref 3.5–5.0)
ALK PHOS: 60 U/L (ref 38–126)
AST: 16 U/L (ref 15–41)
Anion gap: 9 (ref 5–15)
BUN: 31 mg/dL — ABNORMAL HIGH (ref 6–20)
CALCIUM: 7.8 mg/dL — AB (ref 8.9–10.3)
CHLORIDE: 100 mmol/L — AB (ref 101–111)
CO2: 31 mmol/L (ref 22–32)
CREATININE: 1.68 mg/dL — AB (ref 0.44–1.00)
GFR calc Af Amer: 33 mL/min — ABNORMAL LOW (ref >60)
GFR calc non Af Amer: 29 mL/min — ABNORMAL LOW (ref >60)
GLUCOSE: 132 mg/dL — AB (ref 65–99)
Potassium: 4.3 mmol/L (ref 3.5–5.1)
SODIUM: 140 mmol/L (ref 135–145)
Total Bilirubin: 0.7 mg/dL (ref 0.3–1.2)
Total Protein: 5.9 g/dL — ABNORMAL LOW (ref 6.5–8.1)

## 2016-06-08 LAB — CBC WITH DIFFERENTIAL/PLATELET
BASOS ABS: 0.1 10*3/uL (ref 0–0.1)
BASOS PCT: 2 %
Eosinophils Absolute: 0 10*3/uL (ref 0–0.7)
Eosinophils Relative: 1 %
HEMATOCRIT: 36.6 % (ref 35.0–47.0)
Hemoglobin: 12 g/dL (ref 12.0–16.0)
LYMPHS PCT: 15 %
Lymphs Abs: 0.9 10*3/uL — ABNORMAL LOW (ref 1.0–3.6)
MCH: 29.6 pg (ref 26.0–34.0)
MCHC: 32.8 g/dL (ref 32.0–36.0)
MCV: 90.1 fL (ref 80.0–100.0)
Monocytes Absolute: 0.7 10*3/uL (ref 0.2–0.9)
Monocytes Relative: 11 %
NEUTROS ABS: 4.5 10*3/uL (ref 1.4–6.5)
Neutrophils Relative %: 71 %
PLATELETS: 143 10*3/uL — AB (ref 150–440)
RBC: 4.05 MIL/uL (ref 3.80–5.20)
RDW: 14.4 % (ref 11.5–14.5)
WBC: 6.3 10*3/uL (ref 3.6–11.0)

## 2016-06-08 LAB — BLOOD GAS, VENOUS
Acid-Base Excess: 13.1 mmol/L — ABNORMAL HIGH (ref 0.0–2.0)
Bicarbonate: 39.8 mmol/L — ABNORMAL HIGH (ref 20.0–28.0)
FIO2: 0.21
PATIENT TEMPERATURE: 37
PCO2 VEN: 60 mmHg (ref 44.0–60.0)
PH VEN: 7.43 (ref 7.250–7.430)

## 2016-06-08 LAB — INFLUENZA PANEL BY PCR (TYPE A & B)
INFLBPCR: NEGATIVE
Influenza A By PCR: NEGATIVE

## 2016-06-08 LAB — MRSA PCR SCREENING: MRSA by PCR: POSITIVE — AB

## 2016-06-08 LAB — TSH: TSH: 1.516 u[IU]/mL (ref 0.350–4.500)

## 2016-06-08 LAB — GLUCOSE, CAPILLARY
Glucose-Capillary: 94 mg/dL (ref 65–99)
Glucose-Capillary: 103 mg/dL — ABNORMAL HIGH (ref 65–99)
Glucose-Capillary: 134 mg/dL — ABNORMAL HIGH (ref 65–99)
Glucose-Capillary: 89 mg/dL (ref 65–99)
Glucose-Capillary: 116 mg/dL — ABNORMAL HIGH (ref 65–99)

## 2016-06-08 LAB — LACTIC ACID, PLASMA: Lactic Acid, Venous: 2.3 mmol/L (ref 0.5–1.9)

## 2016-06-08 LAB — TROPONIN I: Troponin I: <0.03 ng/mL (ref <0.03)

## 2016-06-08 MED ORDER — PREGABALIN 25 MG PO CAPS
25.0000 mg | ORAL_CAPSULE | Freq: Every evening | ORAL | Status: DC
  Administered 2016-06-09 – 2016-06-11 (×3): 25 mg via ORAL
  Filled 2016-06-08 (×4): qty 1

## 2016-06-08 MED ORDER — DEXTROSE 5 % IV SOLN: 1.0000 g | Freq: Once | INTRAVENOUS | Status: DC

## 2016-06-08 MED ORDER — VANCOMYCIN HCL IN DEXTROSE 1-5 GM/200ML-% IV SOLN
1000.0000 mg | INTRAVENOUS | Status: DC
  Administered 2016-06-08 – 2016-06-11 (×4): 1000 mg via INTRAVENOUS
  Filled 2016-06-08 (×4): qty 200

## 2016-06-08 MED ORDER — CYANOCOBALAMIN 1000 MCG/ML IJ SOLN
1000.0000 ug | INTRAMUSCULAR | Status: DC
  Filled 2016-06-08: qty 1

## 2016-06-08 MED ORDER — FLUTICASONE PROPIONATE 50 MCG/ACT NA SUSP
2.0000 | Freq: Every day | NASAL | Status: DC
  Administered 2016-06-08 – 2016-06-13 (×6): 2 via NASAL
  Filled 2016-06-08: qty 16

## 2016-06-08 MED ORDER — DIVALPROEX SODIUM 125 MG PO CSDR
125.0000 mg | DELAYED_RELEASE_CAPSULE | Freq: Two times a day (BID) | ORAL | Status: DC
  Administered 2016-06-09 – 2016-06-13 (×8): 125 mg via ORAL
  Filled 2016-06-08 (×8): qty 1

## 2016-06-08 MED ORDER — METOPROLOL TARTRATE 25 MG PO TABS: 12.5000 mg | ORAL_TABLET | Freq: Two times a day (BID) | ORAL | Status: DC

## 2016-06-08 MED ORDER — HYDROCODONE-ACETAMINOPHEN 5-325 MG PO TABS
1.0000 | ORAL_TABLET | Freq: Every evening | ORAL | Status: DC | PRN
  Administered 2016-06-11 (×2): 1 via ORAL
  Filled 2016-06-08 (×2): qty 1

## 2016-06-08 MED ORDER — MUPIROCIN 2 % EX OINT
1.0000 "application " | TOPICAL_OINTMENT | Freq: Two times a day (BID) | CUTANEOUS | Status: AC
  Administered 2016-06-08 – 2016-06-12 (×10): 1 via NASAL
  Filled 2016-06-08 (×2): qty 22

## 2016-06-08 MED ORDER — ONDANSETRON HCL 4 MG/2ML IJ SOLN
4.0000 mg | Freq: Four times a day (QID) | INTRAMUSCULAR | Status: DC | PRN
  Administered 2016-06-10 – 2016-06-13 (×3): 4 mg via INTRAVENOUS
  Filled 2016-06-08 (×2): qty 2

## 2016-06-08 MED ORDER — CARBIDOPA-LEVODOPA 25-100 MG PO TABS
1.0000 | ORAL_TABLET | Freq: Four times a day (QID) | ORAL | Status: DC
  Administered 2016-06-09 – 2016-06-13 (×12): 1 via ORAL
  Filled 2016-06-08 (×16): qty 1

## 2016-06-08 MED ORDER — CHLORHEXIDINE GLUCONATE CLOTH 2 % EX PADS
6.0000 | MEDICATED_PAD | Freq: Every day | CUTANEOUS | Status: AC
  Administered 2016-06-08 – 2016-06-12 (×5): 6 via TOPICAL

## 2016-06-08 MED ORDER — SENNOSIDES-DOCUSATE SODIUM 8.6-50 MG PO TABS
2.0000 | ORAL_TABLET | Freq: Two times a day (BID) | ORAL | Status: DC
  Administered 2016-06-09 – 2016-06-13 (×8): 2 via ORAL
  Filled 2016-06-08 (×8): qty 2

## 2016-06-08 MED ORDER — DILTIAZEM HCL ER COATED BEADS 180 MG PO CP24
360.0000 mg | ORAL_CAPSULE | Freq: Every day | ORAL | Status: DC
  Administered 2016-06-10 – 2016-06-12 (×2): 360 mg via ORAL
  Filled 2016-06-08 (×4): qty 2

## 2016-06-08 MED ORDER — IPRATROPIUM-ALBUTEROL 0.5-2.5 (3) MG/3ML IN SOLN
3.0000 mL | RESPIRATORY_TRACT | Status: DC | PRN
  Administered 2016-06-08: 3 mL via RESPIRATORY_TRACT
  Filled 2016-06-08: qty 3

## 2016-06-08 MED ORDER — VANCOMYCIN HCL IN DEXTROSE 1-5 GM/200ML-% IV SOLN
1000.0000 mg | Freq: Once | INTRAVENOUS | Status: AC
  Administered 2016-06-08: 10:00:00 1000 mg via INTRAVENOUS
  Filled 2016-06-08: qty 200

## 2016-06-08 MED ORDER — FUROSEMIDE 40 MG PO TABS: 80.0000 mg | ORAL_TABLET | Freq: Every day | ORAL | Status: DC

## 2016-06-08 MED ORDER — ENOXAPARIN SODIUM 40 MG/0.4ML ~~LOC~~ SOLN
40.0000 mg | Freq: Two times a day (BID) | SUBCUTANEOUS | Status: DC
  Administered 2016-06-08 – 2016-06-13 (×11): 40 mg via SUBCUTANEOUS
  Filled 2016-06-08 (×11): qty 0.4

## 2016-06-08 MED ORDER — DEXTROSE 5 % IV SOLN
500.0000 mg | Freq: Once | INTRAVENOUS | Status: AC
  Administered 2016-06-08: 500 mg via INTRAVENOUS
  Filled 2016-06-08: qty 500

## 2016-06-08 MED ORDER — CEFTRIAXONE SODIUM-DEXTROSE 1-3.74 GM-% IV SOLR
1.0000 g | Freq: Once | INTRAVENOUS | Status: AC
  Administered 2016-06-08: 1 g via INTRAVENOUS

## 2016-06-08 MED ORDER — ADULT MULTIVITAMIN W/MINERALS CH
1.0000 | ORAL_TABLET | Freq: Every day | ORAL | Status: DC
  Administered 2016-06-10 – 2016-06-13 (×4): 1 via ORAL
  Filled 2016-06-08 (×4): qty 1

## 2016-06-08 MED ORDER — INSULIN ASPART 100 UNIT/ML ~~LOC~~ SOLN
0.0000 [IU] | Freq: Three times a day (TID) | SUBCUTANEOUS | Status: DC
Start: 2016-06-08 — End: 2016-06-13
  Administered 2016-06-08: 3 [IU] via SUBCUTANEOUS
  Administered 2016-06-09: 4 [IU] via SUBCUTANEOUS
  Administered 2016-06-10 – 2016-06-11 (×2): 3 [IU] via SUBCUTANEOUS
  Filled 2016-06-08 (×3): qty 3
  Filled 2016-06-08: qty 4

## 2016-06-08 MED ORDER — ORAL CARE MOUTH RINSE
15.0000 mL | Freq: Two times a day (BID) | OROMUCOSAL | Status: DC
  Administered 2016-06-09 – 2016-06-13 (×8): 15 mL via OROMUCOSAL

## 2016-06-08 MED ORDER — CEFTRIAXONE SODIUM-DEXTROSE 1-3.74 GM-% IV SOLR
INTRAVENOUS | Status: AC
  Filled 2016-06-08: qty 50

## 2016-06-08 MED ORDER — SODIUM CHLORIDE 0.9 % IV BOLUS (SEPSIS)
500.0000 mL | Freq: Once | INTRAVENOUS | Status: AC
  Administered 2016-06-08: 500 mL via INTRAVENOUS

## 2016-06-08 MED ORDER — FUROSEMIDE 10 MG/ML IJ SOLN
20.0000 mg | Freq: Once | INTRAMUSCULAR | Status: AC
Start: 2016-06-08 — End: 2016-06-08
  Administered 2016-06-08: 20 mg via INTRAVENOUS
  Filled 2016-06-08: qty 2

## 2016-06-08 MED ORDER — GABAPENTIN 100 MG PO CAPS
100.0000 mg | ORAL_CAPSULE | Freq: Three times a day (TID) | ORAL | Status: DC
Start: 2016-06-08 — End: 2016-06-13
  Administered 2016-06-09 – 2016-06-13 (×11): 100 mg via ORAL
  Filled 2016-06-08 (×12): qty 1

## 2016-06-08 MED ORDER — TIOTROPIUM BROMIDE MONOHYDRATE 18 MCG IN CAPS
18.0000 ug | ORAL_CAPSULE | Freq: Every day | RESPIRATORY_TRACT | Status: DC
  Administered 2016-06-10 – 2016-06-13 (×4): 18 ug via RESPIRATORY_TRACT
  Filled 2016-06-08: qty 5

## 2016-06-08 MED ORDER — INSULIN GLARGINE 100 UNIT/ML ~~LOC~~ SOLN
22.0000 [IU] | Freq: Every day | SUBCUTANEOUS | Status: DC
  Administered 2016-06-08 – 2016-06-11 (×4): 22 [IU] via SUBCUTANEOUS
  Filled 2016-06-08 (×5): qty 0.22

## 2016-06-08 MED ORDER — PANTOPRAZOLE SODIUM 40 MG PO TBEC
40.0000 mg | DELAYED_RELEASE_TABLET | Freq: Every day | ORAL | Status: DC
  Administered 2016-06-10 – 2016-06-13 (×4): 40 mg via ORAL
  Filled 2016-06-08 (×4): qty 1

## 2016-06-08 MED ORDER — MAGNESIUM OXIDE 400 (241.3 MG) MG PO TABS
400.0000 mg | ORAL_TABLET | Freq: Two times a day (BID) | ORAL | Status: DC
  Administered 2016-06-09 – 2016-06-13 (×8): 400 mg via ORAL
  Filled 2016-06-08 (×8): qty 1

## 2016-06-08 MED ORDER — ACETAMINOPHEN 650 MG RE SUPP
650.0000 mg | Freq: Four times a day (QID) | RECTAL | Status: DC | PRN
  Administered 2016-06-08: 650 mg via RECTAL
  Filled 2016-06-08 (×2): qty 1

## 2016-06-08 MED ORDER — ONDANSETRON HCL 4 MG PO TABS: 4.0000 mg | ORAL_TABLET | Freq: Four times a day (QID) | ORAL | Status: DC | PRN

## 2016-06-08 MED ORDER — FUROSEMIDE 10 MG/ML IJ SOLN
60.0000 mg | Freq: Once | INTRAMUSCULAR | Status: AC
  Administered 2016-06-08: 06:00:00 60 mg via INTRAVENOUS
  Filled 2016-06-08: qty 6

## 2016-06-08 MED ORDER — SODIUM CHLORIDE 0.9 % IV SOLN
Freq: Once | INTRAVENOUS | Status: AC
  Administered 2016-06-08: 03:00:00 via INTRAVENOUS

## 2016-06-08 MED ORDER — ACETAMINOPHEN 325 MG PO TABS
650.0000 mg | ORAL_TABLET | Freq: Four times a day (QID) | ORAL | Status: DC | PRN
  Administered 2016-06-09 – 2016-06-10 (×3): 650 mg via ORAL
  Filled 2016-06-08 (×2): qty 2

## 2016-06-08 MED ORDER — SODIUM CHLORIDE 0.9 % IV SOLN
INTRAVENOUS | Status: DC
  Administered 2016-06-08 – 2016-06-10 (×4): via INTRAVENOUS

## 2016-06-08 NOTE — ED Notes (Signed)
Critical lactic acid of 2.3 called from lab. Dr. Dahlia Client notified.

## 2016-06-08 NOTE — Progress Notes (Signed)
Pharmacy Antibiotic Note  Tina [name] is a 76 y.o. female admitted on 06/07/2016 with pneumonia.  Pharmacy has been consulted for Vancomycin dosing.  Plan: Vancomycin 1g IV every 24 hours.  Goal trough 15-20 mcg/mL.  Trough level ordered prior to 5th dose on 1/31 at 15:30.    Height: 5\' 5"  (165.1 cm) Weight: 258 lb 6 oz (117.2 kg) IBW/kg (Calculated) : 57  Temp (24hrs), Avg:100.1 F (37.8 C), Min:99.6 F (37.6 C), Max:100.9 F (38.3 C)   Recent Labs Lab 06/07/16 2307 06/07/16 2308 06/08/16 0000  WBC  --   --  6.3  CREATININE 1.68*  --   --   LATICACIDVEN  --  2.3*  --     Estimated Creatinine Clearance: 37 mL/min (by C-G formula based on SCr of 1.68 mg/dL (H)).    Allergies  Allergen Reactions  . Morphine And Related     Antimicrobials this admission: Azithromycin 1/28 >> 1 dose Ceftriaxone 1/28 >> 1 dose Vancomycin 1/28 >>  Dose adjustments this admission: N/A  Microbiology results: 1/28 BCx: pending x 2 1/28 MRSA PCR: positive  Thank you for allowing pharmacy to be a part of this patient's care.  Olivia Canter, [place] Clinical Pharmacist 06/08/2016 12:43 PM

## 2016-06-08 NOTE — Progress Notes (Signed)
Lovenox changed to 40 mg BID for BMI >40 and CrCl >30. 

## 2016-06-08 NOTE — Clinical Social Work Note (Signed)
Clinical Social Work Assessment  Patient Details  Name: Tina [name] MRN: [id number] Date of Birth: July 14, 1940  Date of referral:  06/08/16               Reason for consult:  Facility Placement                Permission sought to share information with:  Facility Art therapist granted to share information::  Yes, Verbal Permission Granted  Name::        Agency::     Relationship::     Contact Information:     Housing/Transportation Living arrangements for the past 2 months:  [place] of Information:  Other (Comment Required) (Niece) Patient Interpreter Needed:  None Criminal Activity/Legal Involvement Pertinent to Current Situation/Hospitalization:  No - Comment as needed Significant Relationships:  Adult Children, Other Family Members Lives with:  Facility Resident Do you feel safe going back to the place where you live?  Yes Need for family participation in patient care:  Yes (Comment)  Care giving concerns:  Patient admitted from [facility]   Social Worker assessment / plan:  CSW visited patient and her family at bedside to discuss dc planning. Her niece Hassan Rowan confirmed that the patient is from [place] and would like to return via EMS once stable. She also reported that Annett Gula, the patient's son, is his HCPOA.  Roderic Palau from [facility] confirmed that the patient can return whenever stable and is LTC. The patient is not ambulatory at baseline and is incontinent of bladder and bowel. She is able to eat independently, but needs assistance with dressing and bathing.   Employment status:  Retired Forensic scientist:    PT Recommendations:  Not assessed at this time Information / Referral to community resources:     Patient/Family's Response to care:  Family thanked CSW for assistance.  Patient/Family's Understanding of and Emotional Response to Diagnosis, Current Treatment, and Prognosis:  Family is involved and able to answer  questions about the patient.  Emotional Assessment Appearance:  Appears stated age Attitude/Demeanor/Rapport:  Lethargic Affect (typically observed):  Pleasant Orientation:  Oriented to Self Alcohol / Substance use:  Never Used Psych involvement (Current and /or in the community):  No (Comment)  Discharge Needs  Concerns to be addressed:  Care Coordination Readmission within the last 30 days:  No Current discharge risk:  None Barriers to Discharge:  Continued Medical Work up   [employer], LCSW 06/08/2016, 3:02 PM

## 2016-06-08 NOTE — H&P (Addendum)
Tina [name] is an 76 y.o. female.   Chief Complaint: Weakness HPI: The patient with past medical history of atrial fibrillation and diabetes presents emergency department complaining of weakness. She was afebrile on arrival but there was report of fever at her nursing facility. At some point endorses some shortness of breath but cannot contribute significantly to her history. She did not endorse shortness of breath to me. Upon arrival the patient barely met criteria for atrial fibrillation with rapid ventricular rate which resolved without intervention. Laboratory evaluation was unremarkable but persistent tachypnea and chest x-ray revealing left lower lobe atelectasis versus infiltrate prompted the emergency department staff to treat for pneumonia prior to calling the hospitalist service for admission.  Past Medical History:  Diagnosis Date  . Atrial fibrillation ([place])   . Atrial flutter ([place])   . Chronic kidney disease    CKD stage 3  . Depression   . Diabetes mellitus   . Fecal incontinence 03/04/2012  . Heart murmur   . Hyperlipidemia   . Hypertension   . Normal exercise sestamibi stress test 2007   [facility]  . Pyelonephritis   . Renal stone   . Stroke [facility]) 06/2011   TIA   . Ureteral stone with hydronephrosis   . Urge incontinence 03/04/2012    Past Surgical History:  Procedure Laterality Date  . ABDOMINAL HYSTERECTOMY    . BLADDER SURGERY  1994  . CYSTOSCOPY W/ URETERAL STENT PLACEMENT  02/27/2012   Procedure: CYSTOSCOPY WITH RETROGRADE PYELOGRAM/URETERAL STENT PLACEMENT;  Surgeon: Malka So, MD;  Location: [facility];  Service: Urology;  Laterality: Left;  cystoscopy, left rerograde pyelogram, insertion of left ureteral dual stents, and removal of bladder stone  . CYSTOSCOPY/RETROGRADE/URETEROSCOPY/STONE EXTRACTION WITH BASKET  03/04/2012   Procedure: CYSTOSCOPY/RETROGRADE/URETEROSCOPY/STONE EXTRACTION WITH BASKET;  Surgeon: Malka So, MD;  Location: [facility];  Service:  Urology;  Laterality: Left;  LEFT URETEROSCOPIC STONE EXTRACTION     . KNEE SURGERY  as child    Family History  Problem Relation Age of Onset  . Coronary artery disease Mother   . Diabetes Mother   . Heart disease Mother   . Cancer Father   . Diabetes Sister   . Hypertension Sister    Social History:  reports that she has never smoked. She has never used smokeless tobacco. She reports that she does not drink alcohol or use drugs.  Allergies:  Allergies  Allergen Reactions  . Morphine And Related     Medications Prior to Admission  Medication Sig Dispense Refill  . bisacodyl (DULCOLAX) 10 MG suppository Place 1 suppository (10 mg total) rectally daily as needed. 12 suppository 0  . carbidopa-levodopa (SINEMET IR) 25-100 MG tablet Take 1 tablet by mouth 4 (four) times daily.    . cyanocobalamin (,VITAMIN B-12,) 1000 MCG/ML injection Inject 1,000 mcg into the muscle every 30 (thirty) days.    Marland Kitchen diltiazem (CARDIZEM CD) 360 MG 24 hr capsule Take 1 capsule (360 mg total) by mouth daily. 30 capsule 0  . divalproex (DEPAKOTE SPRINKLE) 125 MG capsule Take 125 mg by mouth 2 (two) times daily. 124m am and 2559mat bedtime    . fluticasone (FLONASE) 50 MCG/ACT nasal spray Place 2 sprays into both nostrils daily.    . furosemide (LASIX) 80 MG tablet Take 80 mg by mouth daily.    . Marland Kitchenabapentin (NEURONTIN) 100 MG capsule Take 100 mg by mouth 3 (three) times daily.    . Marland KitchenYDROcodone-acetaminophen (NORCO/VICODIN) 5-325 MG tablet Take 1  tablet by mouth at bedtime as needed for moderate pain.    Marland Kitchen insulin glargine (LANTUS) 100 UNIT/ML injection Inject 35 Units into the skin at bedtime.  10 mL   . insulin lispro (HUMALOG) 100 UNIT/ML injection Inject 8 Units into the skin 3 (three) times daily before meals.    Marland Kitchen ipratropium-albuterol (DUONEB) 0.5-2.5 (3) MG/3ML SOLN Take 3 mLs by nebulization every 4 (four) hours as needed (wheezing).    . magnesium oxide (MAG-OX) 400 MG tablet Take 400 mg by mouth  2 (two) times daily.    . metoprolol tartrate (LOPRESSOR) 25 MG tablet Take 1 tablet (25 mg total) by mouth 2 (two) times daily. (Patient taking differently: Take 12.5 mg by mouth 2 (two) times daily. ) 30 tablet 0  . Multiple Vitamin (MULTIVITAMIN WITH MINERALS) TABS Take 1 tablet by mouth daily.    Marland Kitchen omeprazole (PRILOSEC) 20 MG capsule Take 20 mg by mouth 2 (two) times daily before a meal.    . pregabalin (LYRICA) 25 MG capsule Take 25 mg by mouth every evening.    . senna-docusate (SENOKOT-S) 8.6-50 MG tablet Take 2 tablets by mouth 2 (two) times daily.    Marland Kitchen tiotropium (SPIRIVA) 18 MCG inhalation capsule Place 18 mcg into inhaler and inhale daily.    . cholestyramine (QUESTRAN) 4 G packet Take 1 packet (4 g total) by mouth every 12 (twelve) hours. (Patient not taking: Reported on 06/07/2016) 60 each 0  . diclofenac sodium (VOLTAREN) 1 % GEL Apply 1 application topically 4 (four) times daily as needed. As needed for pain (Patient not taking: Reported on 06/07/2016) 100 g 0  . hyoscyamine (LEVSIN SL) 0.125 MG SL tablet Take 1 tablet (0.125 mg total) by mouth every 4 (four) hours as needed. (Patient not taking: Reported on 06/07/2016) 30 tablet 0  . meclizine (ANTIVERT) 12.5 MG tablet Take 1 tablet (12.5 mg total) by mouth 3 (three) times daily as needed for dizziness. (Patient not taking: Reported on 06/07/2016) 30 tablet 0  . Rivaroxaban (XARELTO) 15 MG TABS tablet Take 1 tablet (15 mg total) by mouth daily with supper. (Patient not taking: Reported on 06/07/2016) 30 tablet 0    Results for orders placed or performed during the hospital encounter of 06/07/16 (from the past 48 hour(s))  Comprehensive metabolic panel     Status: Abnormal   Collection Time: 06/07/16 11:07 PM  Result Value Ref Range   Sodium 140 135 - 145 mmol/L   Potassium 4.3 3.5 - 5.1 mmol/L   Chloride 100 (L) 101 - 111 mmol/L   CO2 31 22 - 32 mmol/L   Glucose, Bld 132 (H) 65 - 99 mg/dL   BUN 31 (H) 6 - 20 mg/dL   Creatinine, Ser  1.68 (H) 0.44 - 1.00 mg/dL   Calcium 7.8 (L) 8.9 - 10.3 mg/dL   Total Protein 5.9 (L) 6.5 - 8.1 g/dL   Albumin 2.9 (L) 3.5 - 5.0 g/dL   AST 16 15 - 41 U/L   ALT <5 (L) 14 - 54 U/L   Alkaline Phosphatase 60 38 - 126 U/L   Total Bilirubin 0.7 0.3 - 1.2 mg/dL   GFR calc non Af Amer 29 (L) >60 mL/min   GFR calc Af Amer 33 (L) >60 mL/min    Comment: (NOTE) The eGFR has been calculated using the CKD EPI equation. This calculation has not been validated in all clinical situations. eGFR's persistently <60 mL/min signify possible Chronic Kidney Disease.    Anion gap  9 5 - 15  Troponin I     Status: None   Collection Time: 06/07/16 11:07 PM  Result Value Ref Range   Troponin I <0.03 <0.03 ng/mL  Influenza panel by PCR (type A & B)     Status: None   Collection Time: 06/07/16 11:08 PM  Result Value Ref Range   Influenza A By PCR NEGATIVE NEGATIVE   Influenza B By PCR NEGATIVE NEGATIVE    Comment: (NOTE) The Xpert Xpress Flu assay is intended as an aid in the diagnosis of  influenza and should not be used as a sole basis for treatment.  This  assay is FDA approved for nasopharyngeal swab specimens only. Nasal  washings and aspirates are unacceptable for Xpert Xpress Flu testing.   Lactic acid, plasma     Status: Abnormal   Collection Time: 06/07/16 11:08 PM  Result Value Ref Range   Lactic Acid, Venous 2.3 (HH) 0.5 - 1.9 mmol/L    Comment: CRITICAL RESULT CALLED TO, READ BACK BY AND VERIFIED WITH APRIL BRUMGARD AT 0008 06/08/16.PMH  Blood gas, venous     Status: Abnormal (Preliminary result)   Collection Time: 06/07/16 11:48 PM  Result Value Ref Range   FIO2 0.21    pH, Ven 7.43 7.250 - 7.430   pCO2, Ven 60 44.0 - 60.0 mmHg   pO2, Ven PENDING 32.0 - 45.0 mmHg   Bicarbonate 39.8 (H) 20.0 - 28.0 mmol/L   Acid-Base Excess 13.1 (H) 0.0 - 2.0 mmol/L   Patient temperature 37.0    Collection site VEIN    Sample type VENOUS   CBC with Differential/Platelet     Status: Abnormal    Collection Time: 06/08/16 12:00 AM  Result Value Ref Range   WBC 6.3 3.6 - 11.0 K/uL   RBC 4.05 3.80 - 5.20 MIL/uL   Hemoglobin 12.0 12.0 - 16.0 g/dL   HCT 36.6 35.0 - 47.0 %   MCV 90.1 80.0 - 100.0 fL   MCH 29.6 26.0 - 34.0 pg   MCHC 32.8 32.0 - 36.0 g/dL   RDW 14.4 11.5 - 14.5 %   Platelets 143 (L) 150 - 440 K/uL   Neutrophils Relative % 71 %   Neutro Abs 4.5 1.4 - 6.5 K/uL   Lymphocytes Relative 15 %   Lymphs Abs 0.9 (L) 1.0 - 3.6 K/uL   Monocytes Relative 11 %   Monocytes Absolute 0.7 0.2 - 0.9 K/uL   Eosinophils Relative 1 %   Eosinophils Absolute 0.0 0 - 0.7 K/uL   Basophils Relative 2 %   Basophils Absolute 0.1 0 - 0.1 K/uL  Urinalysis, Complete w Microscopic     Status: Abnormal   Collection Time: 06/08/16  2:43 AM  Result Value Ref Range   Color, Urine YELLOW (A) YELLOW   APPearance CLOUDY (A) CLEAR   Specific Gravity, Urine 1.012 1.005 - 1.030   pH 6.0 5.0 - 8.0   Glucose, UA NEGATIVE NEGATIVE mg/dL   Hgb urine dipstick SMALL (A) NEGATIVE   Bilirubin Urine NEGATIVE NEGATIVE   Ketones, ur NEGATIVE NEGATIVE mg/dL   Protein, ur NEGATIVE NEGATIVE mg/dL   Nitrite NEGATIVE NEGATIVE   Leukocytes, UA LARGE (A) NEGATIVE   RBC / HPF 6-30 0 - 5 RBC/hpf   WBC, UA TOO NUMEROUS TO COUNT 0 - 5 WBC/hpf   Bacteria, UA RARE (A) NONE SEEN   Squamous Epithelial / LPF 0-5 (A) NONE SEEN   WBC Clumps PRESENT    Mucous PRESENT  Hyaline Casts, UA PRESENT    Dg Chest 1 View  Result Date: 06/07/2016 CLINICAL DATA:  76 year old female with cough and fever EXAM: CHEST 1 VIEW COMPARISON:  Chest radiograph dated 02/29/2012 FINDINGS: There is stable mild cardiomegaly with pulmonary vascular congestion. Left lung base streaky density may represent atelectasis versus infiltrate. There is no pleural effusion or pneumothorax. There is mild cardiomegaly similar to prior radiograph. No acute osseous pathology identified. IMPRESSION: Cardiomegaly with mild vascular congestion. Left lung base  density may represent atelectasis versus infiltrate. Clinical correlation is recommended. Electronically Signed   By: Anner Crete M.D.   On: 06/07/2016 23:40    Review of Systems  Unable to perform ROS: Medical condition  Neurological: Positive for weakness.    Blood pressure (!) 111/99, pulse (!) 104, temperature 99.9 F (37.7 C), temperature source Oral, resp. rate (!) 25, weight 120.9 kg (266 lb 7 oz), SpO2 93 %. Physical Exam  Vitals reviewed. Constitutional: She appears well-developed and well-nourished. She has a sickly appearance. No distress.  Tremulous, ?[place]  HENT:  Head: Normocephalic and atraumatic.  Mouth/Throat: Oropharynx is clear and moist.  Eyes: Conjunctivae and EOM are normal. Pupils are equal, round, and reactive to light. No scleral icterus.  Neck: Normal range of motion. Neck supple. No JVD present. No tracheal deviation present. No thyromegaly present.  Cardiovascular: Normal rate, regular rhythm and normal heart sounds.  Exam reveals no gallop and no friction rub.   No murmur heard. Respiratory: Effort normal and breath sounds normal.  GI: Soft. Bowel sounds are normal. She exhibits no distension. There is no tenderness.  Musculoskeletal: Normal range of motion. She exhibits edema.  Lymphadenopathy:    She has no cervical adenopathy.  Neurological: She is alert. No cranial nerve deficit. She exhibits normal muscle tone.  Skin: Skin is warm and dry.  Psychiatric:  Difficult to assess as the patient is somnolent and unable to coherently contribute to her history     Assessment/Plan This is a 76 year old female admitted for pneumonia urinary tract infection. 1. Pneumonia: Healthcare associated; the patient has received azithromycin and ceftriaxone in the emergency department. I have added vancomycin due to positive MRSA screening. Supplemental oxygen as needed 2. Urinary tract infection: Follow urine culture for growth and sensitivities. 3. Atrial  fibrillation: Rate controlled; continue Cardizem and metoprolol.  Continue Xarelto 4. He at bedtime: Diastolic; chronic. Continue Lasix 5. Diabetes mellitus type 2: Adjust basal insulin for hospital diet; sliding scale insulin while hospitalized. Neurontin and Lyrica for neuropathy 6. CKD: Stage III; avoid nephrotoxic agents. Hold NSAIDs 7. COPD: Continue Spiriva 8. Parkinsonian syndrome: Continue Sinemet 9. Pressure ulcers: Heels; bilaterally. Wound consult placed 10. DVT prophylaxis: Systemic anticoagulation as above 11. GI prophylaxis: PPI per home regimen The patient is a DO NOT RESUSCITATE. Time spent on admission orders and patient care approximately 45 minutes  Harrie Foreman, MD 06/08/2016, 4:33 AM

## 2016-06-08 NOTE — ED Provider Notes (Signed)
[facility] Emergency Department Provider Note   ____________________________________________   First MD Initiated Contact with Patient 06/07/16 2333     (approximate)  I have reviewed the triage vital signs and the nursing notes.   HISTORY  Chief Complaint Fever and Shortness of Breath  The patient has some mild confusion and is somnolent.  HPI Tina [name] is a 76 y.o. female who comes into the hospital today with some shortness of breath and fever. She reports that the shortness of breath started today. The patient is on oxygen at home but she couldn't tell me exactly what for. She denies any pain at this time but reports that she's had a cough. She could not tell me if it was a productive cough and how long its been there. The patient has some confabulation and has a very difficult time focusing to answer questions. According to EMS the patient had a temperature at her facility of 101. She was also having some flulike symptoms with some cough and fever as well as altered mental status. The patient was sent here for evaluation.   Past Medical History:  Diagnosis Date  . Atrial fibrillation ([place])   . Atrial flutter ([place])   . Chronic kidney disease    CKD stage 3  . Depression   . Diabetes mellitus   . Fecal incontinence 03/04/2012  . Heart murmur   . Hyperlipidemia   . Hypertension   . Normal exercise sestamibi stress test 2007   [facility]  . Pyelonephritis   . Renal stone   . Stroke [facility]) 06/2011   TIA   . Ureteral stone with hydronephrosis   . Urge incontinence 03/04/2012    Patient Active Problem List   Diagnosis Date Noted  . Urge incontinence 03/04/2012  . Fecal incontinence 03/04/2012  . Hydronephrosis 03/03/2012  . Chronic cystitis 02/27/2012  . Pyelonephritis   . Altered mental status 02/05/2012  . Pressure ulcer of heel, bilateral 02/02/2012  . Diastolic dysfunction [id number]  . Acute kidney injury ([place]) 01/31/2012  .  Diarrhea 01/31/2012  . Atrial fibrillation ([place]) 07/07/2011  . DIABETIC PERIPHERAL NEUROPATHY 03/06/2010  . CHRONIC KIDNEY DISEASE STAGE III (MODERATE) 01/11/2010  . CARCINOMA, SKIN, SQUAMOUS CELL 07/30/2009  . INSOMNIA 02/26/2009  . DM (diabetes mellitus) with complications ([place]) 0000000  . HYPERLIPIDEMIA 02/11/2007  . OBESITY NOS 02/11/2007  . ANEMIA NEC 02/11/2007  . ESSENTIAL HYPERTENSION 02/11/2007  . GERD 02/11/2007  . DEGENERATIVE JOINT DISEASE 02/11/2007    Past Surgical History:  Procedure Laterality Date  . ABDOMINAL HYSTERECTOMY    . BLADDER SURGERY  1994  . CYSTOSCOPY W/ URETERAL STENT PLACEMENT  02/27/2012   Procedure: CYSTOSCOPY WITH RETROGRADE PYELOGRAM/URETERAL STENT PLACEMENT;  Surgeon: Malka So, MD;  Location: [facility]S;  Service: Urology;  Laterality: Left;  cystoscopy, left rerograde pyelogram, insertion of left ureteral dual stents, and removal of bladder stone  . CYSTOSCOPY/RETROGRADE/URETEROSCOPY/STONE EXTRACTION WITH BASKET  03/04/2012   Procedure: CYSTOSCOPY/RETROGRADE/URETEROSCOPY/STONE EXTRACTION WITH BASKET;  Surgeon: Malka So, MD;  Location: [facility];  Service: Urology;  Laterality: Left;  LEFT URETEROSCOPIC STONE EXTRACTION     . KNEE SURGERY  as child    Prior to Admission medications   Medication Sig Start Date End Date Taking? Authorizing Provider  bisacodyl (DULCOLAX) 10 MG suppository Place 1 suppository (10 mg total) rectally daily as needed. 03/10/12  Yes Amanda Dancy, PA-C  carbidopa-levodopa (SINEMET IR) 25-100 MG tablet Take 1 tablet by mouth 4 (four) times daily.  Yes Historical Provider, MD  cyanocobalamin (,VITAMIN B-12,) 1000 MCG/ML injection Inject 1,000 mcg into the muscle every 30 (thirty) days.   Yes Historical Provider, MD  diltiazem (CARDIZEM CD) 360 MG 24 hr capsule Take 1 capsule (360 mg total) by mouth daily. 03/10/12  Yes Amanda Dancy, PA-C  divalproex (DEPAKOTE SPRINKLE) 125 MG capsule Take 125 mg by mouth 2 (two) times  daily. 125mg  am and 250mg  at bedtime   Yes Historical Provider, MD  fluticasone (FLONASE) 50 MCG/ACT nasal spray Place 2 sprays into both nostrils daily.   Yes Historical Provider, MD  furosemide (LASIX) 80 MG tablet Take 80 mg by mouth daily.   Yes Historical Provider, MD  gabapentin (NEURONTIN) 100 MG capsule Take 100 mg by mouth 3 (three) times daily.   Yes Historical Provider, MD  HYDROcodone-acetaminophen (NORCO/VICODIN) 5-325 MG tablet Take 1 tablet by mouth at bedtime as needed for moderate pain.   Yes Historical Provider, MD  insulin glargine (LANTUS) 100 UNIT/ML injection Inject 35 Units into the skin at bedtime.  07/28/11  Yes Lind Covert, MD  insulin lispro (HUMALOG) 100 UNIT/ML injection Inject 8 Units into the skin 3 (three) times daily before meals.   Yes Historical Provider, MD  ipratropium-albuterol (DUONEB) 0.5-2.5 (3) MG/3ML SOLN Take 3 mLs by nebulization every 4 (four) hours as needed (wheezing).   Yes Historical Provider, MD  magnesium oxide (MAG-OX) 400 MG tablet Take 400 mg by mouth 2 (two) times daily.   Yes Historical Provider, MD  metoprolol tartrate (LOPRESSOR) 25 MG tablet Take 1 tablet (25 mg total) by mouth 2 (two) times daily. Patient taking differently: Take 12.5 mg by mouth 2 (two) times daily.  03/10/12  Yes Debbrah Alar, PA-C  Multiple Vitamin (MULTIVITAMIN WITH MINERALS) TABS Take 1 tablet by mouth daily.   Yes Historical Provider, MD  omeprazole (PRILOSEC) 20 MG capsule Take 20 mg by mouth 2 (two) times daily before a meal.   Yes Historical Provider, MD  pregabalin (LYRICA) 25 MG capsule Take 25 mg by mouth every evening.   Yes Historical Provider, MD  senna-docusate (SENOKOT-S) 8.6-50 MG tablet Take 2 tablets by mouth 2 (two) times daily.   Yes Historical Provider, MD  tiotropium (SPIRIVA) 18 MCG inhalation capsule Place 18 mcg into inhaler and inhale daily.   Yes Historical Provider, MD  cholestyramine Lucrezia Starch) 4 G packet Take 1 packet (4 g total) by  mouth every 12 (twelve) hours. Patient not taking: Reported on 06/07/2016 03/10/12   Debbrah Alar, PA-C  diclofenac sodium (VOLTAREN) 1 % GEL Apply 1 application topically 4 (four) times daily as needed. As needed for pain Patient not taking: Reported on 06/07/2016 10/03/11   Lind Covert, MD  hyoscyamine (LEVSIN SL) 0.125 MG SL tablet Take 1 tablet (0.125 mg total) by mouth every 4 (four) hours as needed. Patient not taking: Reported on 06/07/2016 03/10/12   Debbrah Alar, PA-C  meclizine (ANTIVERT) 12.5 MG tablet Take 1 tablet (12.5 mg total) by mouth 3 (three) times daily as needed for dizziness. Patient not taking: Reported on 06/07/2016 03/10/12   Debbrah Alar, PA-C  Rivaroxaban (XARELTO) 15 MG TABS tablet Take 1 tablet (15 mg total) by mouth daily with supper. Patient not taking: Reported on 06/07/2016 03/10/12   Debbrah Alar, PA-C    Allergies Morphine and related  Family History  Problem Relation Age of Onset  . Coronary artery disease Mother   . Diabetes Mother   . Heart disease Mother   . Cancer  Father   . Diabetes Sister   . Hypertension Sister     Social History Social History  Substance Use Topics  . Smoking status: Never Smoker  . Smokeless tobacco: Never Used  . Alcohol use No    Review of Systems Constitutional:  fever/chills Eyes: No visual changes. ENT: No sore throat. Cardiovascular: Denies chest pain. Respiratory: Cough and shortness of breath. Gastrointestinal: No abdominal pain.  No nausea, no vomiting.  No diarrhea.  No constipation. Genitourinary: Negative for dysuria. Musculoskeletal: Negative for back pain. Skin: Negative for rash. Neurological: Confusion  10-point ROS otherwise negative.  ____________________________________________   PHYSICAL EXAM:  VITAL SIGNS: ED Triage Vitals  Enc Vitals Group     BP 06/07/16 2254 (!) 106/47     Pulse Rate 06/07/16 2254 (!) 101     Resp 06/07/16 2254 (!) 22     Temp 06/07/16 2254 99.6 F (37.6  C)     Temp Source 06/07/16 2254 Oral     SpO2 06/07/16 2254 93 %     Weight 06/07/16 2256 266 lb 7 oz (120.9 kg)     Height --      Head Circumference --      Peak Flow --      Pain Score --      Pain Loc --      Pain Edu? --      Excl. in [place]? --     Constitutional: Alert and somnolent, the patient is slow to answer questions. Ill appearing and in moderate distress. Eyes: Conjunctivae are normal. PERRL. EOMI. Head: Atraumatic. Nose: No congestion/rhinnorhea. Mouth/Throat: Mucous membranes are moist.  Oropharynx non-erythematous. Cardiovascular: Normal rate, irregular rhythm. Systolic murmur.  Good peripheral circulation. Respiratory: Normal respiratory effort.  No retractions.some crackles and mild wheezing in bases worse on left Gastrointestinal: Soft and nontender. No distention. Positive bowel sounds Musculoskeletal: No lower extremity tenderness nor edema.   Neurologic:  Normal speech and language. Patient slow to respond and falls asleep during exam. Ask "what did you say" often Skin:  Skin is warm, dry and intact.  Psychiatric: somnolent  ____________________________________________   LABS (all labs ordered are listed, but only abnormal results are displayed)  Labs Reviewed  COMPREHENSIVE METABOLIC PANEL - Abnormal; Notable for the following:       Result Value   Chloride 100 (*)    Glucose, Bld 132 (*)    BUN 31 (*)    Creatinine, Ser 1.68 (*)    Calcium 7.8 (*)    Total Protein 5.9 (*)    Albumin 2.9 (*)    ALT <5 (*)    GFR calc non Af Amer 29 (*)    GFR calc Af Amer 33 (*)    All other components within normal limits  LACTIC ACID, PLASMA - Abnormal; Notable for the following:    Lactic Acid, Venous 2.3 (*)    All other components within normal limits  CBC WITH DIFFERENTIAL/PLATELET - Abnormal; Notable for the following:    Platelets 143 (*)    Lymphs Abs 0.9 (*)    All other components within normal limits  BLOOD GAS, VENOUS - Abnormal; Notable for the  following:    Bicarbonate 39.8 (*)    Acid-Base Excess 13.1 (*)    All other components within normal limits  CULTURE, BLOOD (ROUTINE X 2)  CULTURE, BLOOD (ROUTINE X 2)  INFLUENZA PANEL BY PCR (TYPE A & B)  TROPONIN I  CBC WITH DIFFERENTIAL/PLATELET  URINALYSIS, COMPLETE (UACMP)  WITH MICROSCOPIC   ____________________________________________  EKG  ED ECG REPORT I, Loney Hering, the attending physician, personally viewed and interpreted this ECG.   Date: 06/07/2016  EKG Time: 2301  Rate: 81  Rhythm: atrial fibrillation, rate 81  Axis: normal  Intervals:none  ST&T Change: none   ____________________________________________  RADIOLOGY  CXR ____________________________________________   PROCEDURES  Procedure(s) performed: None  Procedures  Critical Care performed: No  ____________________________________________   INITIAL IMPRESSION / ASSESSMENT AND PLAN / ED COURSE  Pertinent labs & imaging results that were available during my care of the patient were reviewed by me and considered in my medical decision making (see chart for details).  This is a 76 year old female who comes into the hospital today with some fever, cough and shortness of breath. The patient is also having some confusion. I did perform a chest x-ray which showed a left lung base density with a concern for atelectasis versus infiltrate. The patient is not febrile here but I did give her an initial normal saline bolus of 500 ML's. The patient does have heart failure. Her blood pressure was a little bit low. Her lactic gas this 2.3. I will give the patient a second 500 ML normal saline bolus as well as some ceftriaxone and azithromycin. The patient's flu is negative and the remaining of the blood work is unremarkable but I'm concerned about a pneumonia given this infiltrate. The patient will be admitted to the hospitalist service.  Clinical Course as of Jun 08 124  Sun Jun 08, 2016  0014  Cardiomegaly with mild vascular congestion.  Left lung base density may represent atelectasis versus infiltrate. Clinical correlation is recommended.   DG Chest 1 View [AW]    Clinical Course User Index [AW] Loney Hering, MD     ____________________________________________   FINAL CLINICAL IMPRESSION(S) / ED DIAGNOSES  Final diagnoses:  Community acquired pneumonia of left lower lobe of lung ([place])  Dyspnea, unspecified type  Confusion      NEW MEDICATIONS STARTED DURING THIS VISIT:  New Prescriptions   No medications on file     Note:  This document was prepared using Dragon voice recognition software and may include unintentional dictation errors.    Loney Hering, MD 06/08/16 [phone]

## 2016-06-08 NOTE — Progress Notes (Addendum)
[facility] at [facility]                                                                                                                                                                                  Patient Demographics   Deriyah Pessin, is a 76 y.o. female, DOB - Sep 15, 1940, [place] date - 06/07/2016   Admitting Physician Harrie Foreman, MD  Outpatient Primary MD for the patient is Pcp Not In System   LOS - 0  Subjective: Pt Admitted with altered mental status, and fever and pna,  Pt son at bedside   Review of Systems:   CONSTITUTIONAL:  unable to provide due to her mental status  Vitals:   Vitals:   06/08/16 0315 06/08/16 0334 06/08/16 0345 06/08/16 0433  BP:    114/80  Pulse: 81  (!) 104 99  Resp: (!) 26  (!) 25 20  Temp:  99.9 F (37.7 C)  (!) 100.9 F (38.3 C)  TempSrc:  Oral  Oral  SpO2: 96%  93% 92%  Weight:    258 lb 6 oz (117.2 kg)  Height:    5\' 5"  (1.651 m)    Wt Readings from Last 3 Encounters:  06/08/16 258 lb 6 oz (117.2 kg)  03/10/12 255 lb 8.2 oz (115.9 kg)  02/05/12 261 lb 14.5 oz (118.8 kg)     Intake/Output Summary (Last 24 hours) at 06/08/16 1150 Last data filed at 06/08/16 0705  Gross per 24 hour  Intake             1395 ml  Output                0 ml  Net             1395 ml    Physical Exam:   GENERAL: Pleasant-appearing in no apparent distress.  HEAD, EYES, EARS, NOSE AND THROAT: Atraumatic, normocephalic. Extraocular muscles are intact. Pupils equal and reactive to light. Sclerae anicteric. No conjunctival injection. No oro-pharyngeal erythema.  NECK: Supple. There is no jugular venous distention. No bruits, no lymphadenopathy, no thyromegaly.  HEART: Regular rate and rhythm,. No murmurs, no rubs, no clicks.  LUNGS: Rhonchus breath sounds, no accessory muscle usage  ABDOMEN: Soft, flat, nontender, nondistended. Has good bowel sounds. No hepatosplenomegaly appreciated.  EXTREMITIES: No  evidence of any cyanosis, clubbing, or peripheral edema.  +2 pedal and radial pulses bilaterally.  NEUROLOGIC: lethargic SKIN: Moist and warm with no rashes appreciated.  Psych: Not anxious, depressed LN: No inguinal LN enlargement    Antibiotics   Anti-infectives    Start     Dose/Rate Route Frequency Ordered  Stop   06/08/16 0930  vancomycin (VANCOCIN) IVPB 1000 mg/200 mL premix     1,000 mg 200 mL/hr over 60 Minutes Intravenous  Once 06/08/16 0833 06/08/16 1116   06/08/16 0115  cefTRIAXone (ROCEPHIN) IVPB 1 g     1 g 100 mL/hr over 30 Minutes Intravenous  Once 06/08/16 0105 06/08/16 0138   06/08/16 0100  cefTRIAXone (ROCEPHIN) 1 g in dextrose 5 % 50 mL IVPB  Status:  Discontinued     1 g 100 mL/hr over 30 Minutes Intravenous  Once 06/08/16 0055 06/08/16 0105   06/08/16 0100  azithromycin (ZITHROMAX) 500 mg in dextrose 5 % 250 mL IVPB     500 mg 250 mL/hr over 60 Minutes Intravenous  Once 06/08/16 0055 06/08/16 0234      Medications   Scheduled Meds: . carbidopa-levodopa  1 tablet Oral QID  . Chlorhexidine Gluconate Cloth  6 each Topical Q0600  . cyanocobalamin  1,000 mcg Intramuscular Q30 days  . diltiazem  360 mg Oral Daily  . divalproex  125 mg Oral BID  . enoxaparin (LOVENOX) injection  40 mg Subcutaneous BID  . fluticasone  2 spray Each Nare Daily  . furosemide  80 mg Oral Daily  . gabapentin  100 mg Oral TID  . insulin aspart  0-20 Units Subcutaneous TID WC  . insulin glargine  22 Units Subcutaneous QHS  . magnesium oxide  400 mg Oral BID  . metoprolol tartrate  12.5 mg Oral BID  . multivitamin with minerals  1 tablet Oral Daily  . mupirocin ointment  1 application Nasal BID  . pantoprazole  40 mg Oral Daily  . pregabalin  25 mg Oral QPM  . senna-docusate  2 tablet Oral BID  . tiotropium  18 mcg Inhalation Daily   Continuous Infusions: . sodium chloride 100 mL/hr at 06/08/16 0705   PRN Meds:.acetaminophen **OR** acetaminophen, HYDROcodone-acetaminophen,  ipratropium-albuterol, ondansetron **OR** ondansetron (ZOFRAN) IV   Data Review:   Micro Results Recent Results (from the past 240 hour(s))  MRSA PCR Screening     Status: Abnormal   Collection Time: 06/08/16  4:31 AM  Result Value Ref Range Status   MRSA by PCR POSITIVE (A) NEGATIVE Final    Comment:        The GeneXpert MRSA Assay (FDA approved for NASAL specimens only), is one component of a comprehensive MRSA colonization surveillance program. It is not intended to diagnose MRSA infection nor to guide or monitor treatment for MRSA infections. RESULT CALLED TO, READ BACK BY AND VERIFIED WITH: PHYLLIS KING ON 06/08/16 AT 0612 BY [facility]     Radiology Reports Dg Chest 1 View  Result Date: 06/07/2016 CLINICAL DATA:  76 year old female with cough and fever EXAM: CHEST 1 VIEW COMPARISON:  Chest radiograph dated 02/29/2012 FINDINGS: There is stable mild cardiomegaly with pulmonary vascular congestion. Left lung base streaky density may represent atelectasis versus infiltrate. There is no pleural effusion or pneumothorax. There is mild cardiomegaly similar to prior radiograph. No acute osseous pathology identified. IMPRESSION: Cardiomegaly with mild vascular congestion. Left lung base density may represent atelectasis versus infiltrate. Clinical correlation is recommended. Electronically Signed   By: Anner Crete M.D.   On: 06/07/2016 23:40     CBC  Recent Labs Lab 06/08/16 0000  WBC 6.3  HGB 12.0  HCT 36.6  PLT 143*  MCV 90.1  MCH 29.6  MCHC 32.8  RDW 14.4  LYMPHSABS 0.9*  MONOABS 0.7  EOSABS 0.0  BASOSABS 0.1  Chemistries   Recent Labs Lab 06/07/16 2307  NA 140  K 4.3  CL 100*  CO2 31  GLUCOSE 132*  BUN 31*  CREATININE 1.68*  CALCIUM 7.8*  AST 16  ALT <5*  ALKPHOS 60  BILITOT 0.7   ------------------------------------------------------------------------------------------------------------------ estimated creatinine clearance is 37 mL/min (by C-G  formula based on SCr of 1.68 mg/dL (H)). ------------------------------------------------------------------------------------------------------------------ No results for input(s): HGBA1C in the last 72 hours. ------------------------------------------------------------------------------------------------------------------ No results for input(s): CHOL, HDL, LDLCALC, TRIG, CHOLHDL, LDLDIRECT in the last 72 hours. ------------------------------------------------------------------------------------------------------------------  Recent Labs  06/07/16 2307  TSH 1.516   ------------------------------------------------------------------------------------------------------------------ No results for input(s): VITAMINB12, FOLATE, FERRITIN, TIBC, IRON, RETICCTPCT in the last 72 hours.  Coagulation profile No results for input(s): INR, PROTIME in the last 168 hours.  No results for input(s): DDIMER in the last 72 hours.  Cardiac Enzymes  Recent Labs Lab 06/07/16 2307  TROPONINI <0.03   ------------------------------------------------------------------------------------------------------------------ Invalid input(s): POCBNP    Assessment & Plan   1. Pneumonia: Healthcare associated;Continue broad-spectrum antibiotics I will ask speech to see make sure patient is not aspirating this is her second pneumonia in 1 month 2. Urinary tract infection: Follow urine culture for growth and sensitivities. 3. Atrial fibrillation: Rate  Poor controlled; continue Cardizem and metoprolol.  Continue Xarelto 4. Chronic diastlic chf continue lasix 5. Diabetes mellitus type 2: continue sliding scale insulin while hospitalized. Neurontin and Lyrica for neuropathy 6. CKD: Stage III; avoid nephrotoxic agents. Hold NSAIDs 7. COPD: Continue Spiriva 8. Parkinsonian syndrome: Continue Sinemet 9. Pressure ulcers: Heels; bilaterally. Wound consult placed 10. DVT prophylaxis: Systemic anticoagulation as  above 11. GI prophylaxis: PPI per home regimen     Code Status Orders        Start     Ordered   06/08/16 0419  Do not attempt resuscitation (DNR)  Continuous    Question Answer Comment  In the event of cardiac or respiratory ARREST Do not call a "code blue"   In the event of cardiac or respiratory ARREST Do not perform Intubation, CPR, defibrillation or ACLS   In the event of cardiac or respiratory ARREST Use medication by any route, position, wound care, and other measures to relive pain and suffering. May use oxygen, suction and manual treatment of airway obstruction as needed for comfort.      06/08/16 0418    Code Status History    Date Active Date Inactive Code Status Order ID Comments User Context   06/08/2016  3:20 AM 06/08/2016  3:20 AM Full Code [id number]  Harrie Foreman, MD ED   02/27/2012  9:23 PM 03/10/2012  9:43 PM Full Code [id number]  Mignon Pine, RN Inpatient    Advance Directive Documentation   Flowsheet Row Most Recent Value  Type of Advance Directive  Healthcare Power of Attorney  Pre-existing out of facility DNR order (yellow form or pink MOST form)  No data  "MOST" Form in Place?  No data           Consults  none   DVT Prophylaxis  Lovenox    Lab Results  Component Value Date   PLT 143 (L) 06/08/2016     Time Spent in minutes76min  Greater than 50% of time spent in care coordination and counseling patient regarding the condition and plan of care.   Dustin Flock M.D on 06/08/2016 at 11:50 AM  Between 7am to 6pm - Pager - [phone]  After 6pm go to [email or web address] - [place]  [facility] Hospitalists   Office  [phone]

## 2016-06-08 NOTE — Consult Note (Signed)
[place] Nurse wound consult note Reason for Consult: Erythematous heels bilaterally, partial thickness skin loss at the base of three large skin tags on the right posterior thigh. Left side of face with chronic lesion measuring 2.4cm x 1.5cm with elevation to 0.4cm.  Friable Wound type: Shear, friction, moisture Pressure Injury POA: Yes Measurement: Partial thickness skin loss:  2cm x 2cm x 0.1cm with pink, red base and scant serous drainage. Erythematous (blanching) of the bilateral heels.   Wound bed:AS described above Drainage (amount, consistency, odor) Scant Periwound: intact, dry Dressing procedure/placement/frequency: I have provided Nursing with guidance via the orders for care of the partial thickness tissue loss at the base of those three large skin tags (Patient's son has declined surgical removal of those areas.)  She will be provided with bilateral pressure redistribution heel boots and will be turned side to side avoiding the supine position while here. I will provide guidance for the care to the lesion oni the left side of her face. [place] nursing team will not follow, but will remain available to this patient, the nursing and medical teams.  Please re-consult if needed. Thanks, Maudie Flakes, MSN, RN, [place], Ar[facility] Abbott  Pager# [phone]

## 2016-06-08 NOTE — ED Notes (Signed)
Spoke with dr. Marcille Blanco regarding blood pressure 89/53, order received for ns infusion.

## 2016-06-09 LAB — BASIC METABOLIC PANEL
Anion gap: 9 (ref 5–15)
BUN: 26 mg/dL — ABNORMAL HIGH (ref 6–20)
CALCIUM: 7.9 mg/dL — AB (ref 8.9–10.3)
CO2: 31 mmol/L (ref 22–32)
CREATININE: 1.5 mg/dL — AB (ref 0.44–1.00)
Chloride: 101 mmol/L (ref 101–111)
GFR calc non Af Amer: 33 mL/min — ABNORMAL LOW (ref >60)
GFR, EST AFRICAN AMERICAN: 38 mL/min — AB (ref >60)
Glucose, Bld: 123 mg/dL — ABNORMAL HIGH (ref 65–99)
Potassium: 3.7 mmol/L (ref 3.5–5.1)
SODIUM: 141 mmol/L (ref 135–145)

## 2016-06-09 LAB — CBC
HCT: 36.2 % (ref 35.0–47.0)
Hemoglobin: 12.4 g/dL (ref 12.0–16.0)
MCH: 30.7 pg (ref 26.0–34.0)
MCHC: 34.2 g/dL (ref 32.0–36.0)
MCV: 89.6 fL (ref 80.0–100.0)
Platelets: 132 10*3/uL — ABNORMAL LOW (ref 150–440)
RBC: 4.04 MIL/uL (ref 3.80–5.20)
RDW: 14.2 % (ref 11.5–14.5)
WBC: 5.2 10*3/uL (ref 3.6–11.0)

## 2016-06-09 LAB — GLUCOSE, CAPILLARY
Glucose-Capillary: 105 mg/dL — ABNORMAL HIGH (ref 65–99)
Glucose-Capillary: 162 mg/dL — ABNORMAL HIGH (ref 65–99)
Glucose-Capillary: 92 mg/dL (ref 65–99)
Glucose-Capillary: 172 mg/dL — ABNORMAL HIGH (ref 65–99)

## 2016-06-09 LAB — HEMOGLOBIN A1C
Hgb A1c MFr Bld: 6.3 % — ABNORMAL HIGH (ref 4.8–5.6)
Mean Plasma Glucose: 134 mg/dL

## 2016-06-09 MED ORDER — ENSURE ENLIVE PO LIQD
237.0000 mL | Freq: Two times a day (BID) | ORAL | Status: DC
  Administered 2016-06-09 – 2016-06-11 (×4): 237 mL via ORAL
  Administered 2016-06-13: 20 mL via ORAL

## 2016-06-09 MED ORDER — FUROSEMIDE 40 MG PO TABS: 80.0000 mg | ORAL_TABLET | Freq: Every day | ORAL | Status: DC

## 2016-06-09 MED ORDER — METOPROLOL TARTRATE 25 MG PO TABS
12.5000 mg | ORAL_TABLET | Freq: Two times a day (BID) | ORAL | Status: DC
  Administered 2016-06-09 – 2016-06-12 (×6): 12.5 mg via ORAL
  Filled 2016-06-09 (×8): qty 1

## 2016-06-09 NOTE — Progress Notes (Signed)
Patient is currently receiving Palliative services at [facility]. CSW [employer] made aware. Thank you. Flo Shanks RN, BSN, [place] and [facility] of [place], [facility] [phone] c

## 2016-06-09 NOTE — Progress Notes (Signed)
Initial Nutrition Assessment  DOCUMENTATION CODES:   Morbid obesity  INTERVENTION:  Provide Ensure Enlive po BID between meals, each supplement provides 350 kcal and 20 grams of protein.  Continue multivitamin with minerals daily.  NUTRITION DIAGNOSIS:   Increased nutrient needs related to wound healing as evidenced by estimated needs.  GOAL:   Patient will meet greater than or equal to 90% of their needs  MONITOR:   PO intake, Supplement acceptance, Labs, I & O's, Weight trends  REASON FOR ASSESSMENT:   Low Braden    ASSESSMENT:   76 year old female with PMHx of DM type 2, HLD, HTN, CKD stage III, TIA 2013, Pyelonephritis, CHF, COPD who presents with weakness from [facility] LTC found to have PNA, UTI.   -Per SLP evaluation patient is on Dysphagia 2 diet with thin liquids.   Spoke with patient at bedside. She reports initially reported her appetite was "okay," but then later reported it was poor. She was unable to report how long her appetite has been poor. Patient reports she eats 3 meals per day at LTC, but unable to provide more information on amounts and types of food eaten.   Patient unsure of UBW but reports she has been losing 2-3 lbs per week over unknown time period. Last weight history in chart was from 2013 (of note, patient is around the same weight she was in 2013).   Medications reviewed and include: Vitamin B12 1000 micrograms monthly IM, Novolog sliding scale TID with meals, Lantus 22 units daily, magnesium oxide 400 mg BID, multivitamin with minerals daily, pantoprazole, senna, vancomycin, NS @ 50 ml/hr.  Labs reviewed: CBG 89-162 past 24 hrs, BUN 26, Creatinine 1.5. HgbA1c 6.3 on 06/07/2016.  Nutrition-Focused physical exam completed. Findings are no fat depletion, no muscle depletion, and mild edema.   Patient is at high risk for malnutrition in setting of reported poor appetite and report of weight loss. However, as she is a poor historian, there is not  enough evidence to make diagnosis of malnutrition.  Discussed with RN.   Diet Order:  DIET DYS 2 Room service appropriate? Yes with Assist; Fluid consistency: Thin  Skin:  Wound (see comment) (Stg II to buttocks)  Last BM:  06/09/2016  Height:   Ht Readings from Last 1 Encounters:  06/08/16 5\' 5"  (1.651 m)    Weight:   Wt Readings from Last 1 Encounters:  06/08/16 258 lb 6 oz (117.2 kg)    Ideal Body Weight:  56.8 kg  BMI:  Body mass index is 43 kg/m.  Estimated Nutritional Needs:   Kcal:  2000-2300 (MSJ x 1.2-1.4)  Protein:  115-140 grams (1-1.2 grams/kg)  Fluid:  2 L/day  EDUCATION NEEDS:   No education needs identified at this time  Willey Blade, MS, RD, LDN Pager: [phone] After Hours Pager: [phone]

## 2016-06-09 NOTE — NC FL2 (Signed)
[place] LEVEL OF CARE SCREENING TOOL     IDENTIFICATION  Patient Name: Tina [name] Birthdate: [birth year] Sex: female Admission Date (Current Location): 06/07/2016  [place] and [place] Number:  Engineering geologist and Address:  [facility], [address] [place], [place] [address]      Provider Number: [id number]  Attending Physician Name and Address:  Dustin Flock, MD  Relative Name and Phone Number:       Current Level of Care: Hospital Recommended Level of Care: [place] Prior Approval Number:    Date Approved/Denied:   PASRR Number:  ([id number] )  Discharge Plan: SNF    Current Diagnoses: Patient Active Problem List   Diagnosis Date Noted  . Pneumonia 06/08/2016  . Pressure injury of skin 06/08/2016  . Urge incontinence 03/04/2012  . Fecal incontinence 03/04/2012  . Hydronephrosis 03/03/2012  . Chronic cystitis 02/27/2012  . Pyelonephritis   . Altered mental status 02/05/2012  . Pressure ulcer of heel, bilateral 02/02/2012  . Diastolic dysfunction [id number]  . Acute kidney injury ([place]) 01/31/2012  . Diarrhea 01/31/2012  . Atrial fibrillation ([place]) 07/07/2011  . DIABETIC PERIPHERAL NEUROPATHY 03/06/2010  . CHRONIC KIDNEY DISEASE STAGE III (MODERATE) 01/11/2010  . CARCINOMA, SKIN, SQUAMOUS CELL 07/30/2009  . INSOMNIA 02/26/2009  . DM (diabetes mellitus) with complications ([place]) 0000000  . HYPERLIPIDEMIA 02/11/2007  . OBESITY NOS 02/11/2007  . ANEMIA NEC 02/11/2007  . ESSENTIAL HYPERTENSION 02/11/2007  . GERD 02/11/2007  . DEGENERATIVE JOINT DISEASE 02/11/2007    Orientation RESPIRATION BLADDER Height & Weight     Self, Place  O2 (2 Liters Oxygen ) Continent Weight: 258 lb 6 oz (117.2 kg) Height:  5\' 5"  (165.1 cm)  BEHAVIORAL SYMPTOMS/MOOD NEUROLOGICAL BOWEL NUTRITION STATUS   (none)  (none) Continent Diet (NPO to be advanced )  AMBULATORY STATUS COMMUNICATION OF NEEDS Skin    Extensive Assist Verbally PU Stage and Appropriate Care, Other (Comment) (Pressure Ulcer Stage 2: Left Buttocks. Non-pressure wound right thing and left side of face. )                       Personal Care Assistance Level of Assistance  Bathing, Feeding, Dressing Bathing Assistance: Limited assistance Feeding assistance: Independent Dressing Assistance: Limited assistance     Functional Limitations Info  Sight, Hearing, Speech Sight Info: Adequate Hearing Info: Adequate Speech Info: Adequate    SPECIAL CARE FACTORS FREQUENCY                       Contractures Contractures Info: Present    Additional Factors Info  Code Status, Allergies, Insulin Sliding Scale, Isolation Precautions Code Status Info:  (DNR ) Allergies Info:  (Morphine and related )   Insulin Sliding Scale Info:  (NovoLog Insulin Injections ) Isolation Precautions Info:  (Droplet precautions (flu was negative) )     Current Medications (06/09/2016):  This is the current hospital active medication list Current Facility-Administered Medications  Medication Dose Route Frequency Provider Last Rate Last Dose  . 0.9 %  sodium chloride infusion   Intravenous Continuous Henreitta Leber, MD 50 mL/hr at 06/09/16 [phone]    . acetaminophen (TYLENOL) tablet 650 mg  650 mg Oral Q6H PRN Harrie Foreman, MD   650 mg at 06/09/16 0350   Or  . acetaminophen (TYLENOL) suppository 650 mg  650 mg Rectal Q6H PRN Harrie Foreman, MD   650 mg at 06/08/16 2100  .  carbidopa-levodopa (SINEMET IR) 25-100 MG per tablet immediate release 1 tablet  1 tablet Oral QID Harrie Foreman, MD      . Chlorhexidine Gluconate Cloth 2 % PADS 6 each  6 each Topical Q0600 Harrie Foreman, MD   6 each at 06/09/16 [phone]  . cyanocobalamin ((VITAMIN B-12)) injection 1,000 mcg  1,000 mcg Intramuscular Q30 days Harrie Foreman, MD      . diltiazem [facility] CD) 24 hr capsule 360 mg  360 mg Oral Daily Harrie Foreman, MD      . divalproex  (DEPAKOTE SPRINKLE) capsule 125 mg  125 mg Oral BID Harrie Foreman, MD      . enoxaparin (LOVENOX) injection 40 mg  40 mg Subcutaneous BID Harrie Foreman, MD   40 mg at 06/08/16 2058  . fluticasone (FLONASE) 50 MCG/ACT nasal spray 2 spray  2 spray Each Nare Daily Harrie Foreman, MD   2 spray at 06/08/16 1018  . furosemide (LASIX) tablet 80 mg  80 mg Oral Daily Harrie Foreman, MD      . gabapentin (NEURONTIN) capsule 100 mg  100 mg Oral TID Harrie Foreman, MD      . HYDROcodone-acetaminophen (NORCO/VICODIN) 5-325 MG per tablet 1 tablet  1 tablet Oral QHS PRN Harrie Foreman, MD      . insulin aspart (novoLOG) injection 0-20 Units  0-20 Units Subcutaneous TID WC Harrie Foreman, MD   3 Units at 06/08/16 1230  . insulin glargine (LANTUS) injection 22 Units  22 Units Subcutaneous QHS Harrie Foreman, MD   22 Units at 06/08/16 2220  . ipratropium-albuterol (DUONEB) 0.5-2.5 (3) MG/3ML nebulizer solution 3 mL  3 mL Nebulization Q4H PRN Harrie Foreman, MD   3 mL at 06/08/16 1727  . magnesium oxide (MAG-OX) tablet 400 mg  400 mg Oral BID Harrie Foreman, MD      . MEDLINE mouth rinse  15 mL Mouth Rinse BID Dustin Flock, MD      . metoprolol tartrate (LOPRESSOR) tablet 12.5 mg  12.5 mg Oral BID Harrie Foreman, MD      . multivitamin with minerals tablet 1 tablet  1 tablet Oral Daily Harrie Foreman, MD      . mupirocin ointment (BACTROBAN) 2 % 1 application  1 application Nasal BID Harrie Foreman, MD   1 application at [id number]  . ondansetron (ZOFRAN) tablet 4 mg  4 mg Oral Q6H PRN Harrie Foreman, MD       Or  . ondansetron [facility]) injection 4 mg  4 mg Intravenous Q6H PRN Harrie Foreman, MD      . pantoprazole (PROTONIX) EC tablet 40 mg  40 mg Oral Daily Harrie Foreman, MD      . pregabalin (LYRICA) capsule 25 mg  25 mg Oral QPM Harrie Foreman, MD      . senna-docusate (Senokot-S) tablet 2 tablet  2 tablet Oral BID Harrie Foreman, MD      .  tiotropium [facility]) inhalation capsule 18 mcg  18 mcg Inhalation Daily Harrie Foreman, MD      . vancomycin (VANCOCIN) IVPB 1000 mg/200 mL premix  1,000 mg Intravenous Q24H Harrie Foreman, MD   1,000 mg at 06/08/16 1647     Discharge Medications: Please see discharge summary for a list of discharge medications.  Relevant Imaging Results:  Relevant Lab Results:   Additional Information  (SSN: [id number])  Moon Budde,  Veronia Beets, LCSW

## 2016-06-09 NOTE — Progress Notes (Signed)
[facility]                                                                                                                                                                                  Patient Demographics   Tina [name], is a 76 y.o. female, DOB - Sep 13, 1940, [id number]  Admit date - 06/07/2016   Admitting Physician Harrie Foreman, MD  Outpatient Primary MD for the patient is Pcp Not In System   LOS - 1  Subjective: Patient complains of feeling cold but more awake and alert.   Review of Systems:    CONSTITUTIONAL: Positive fever. Positive fatigue, positive weakness. No weight gain, no weight loss.  EYES: No blurry or double vision.  ENT: No tinnitus. No postnasal drip. No redness of the oropharynx.  RESPIRATORY: Positive cough, no wheeze, no hemoptysis. Positive dyspnea.  CARDIOVASCULAR: No chest pain. No orthopnea. No palpitations. No syncope.  GASTROINTESTINAL: No nausea, no vomiting or diarrhea. No abdominal pain. No melena or hematochezia.  GENITOURINARY:  No urgency. No frequency. No dysuria. No hematuria. No obstructive symptoms. No discharge. No pain. No significant abnormal bleeding ENDOCRINE: No polyuria or nocturia. No heat or cold intolerance.  HEMATOLOGY: No anemia. No bruising. No bleeding. No purpura. No petechiae INTEGUMENTARY: No rashes. No lesions.  MUSCULOSKELETAL: No arthritis. No swelling. No gout.  NEUROLOGIC: No numbness, tingling, or ataxia. No seizure-type activity.  PSYCHIATRIC: No anxiety. No insomnia. No ADD.     Vitals:   Vitals:   06/08/16 1945 06/09/16 0053 06/09/16 0533 06/09/16 1031  BP: (!) 129/104  (!) 86/39 (!) 148/87  Pulse: 99  93 (!) 111  Resp: 18  18 20   Temp: (!) 102.1 F (38.9 C) 98.9 F (37.2 C) 98.3 F (36.8 C) 99.8 F (37.7 C)  TempSrc: Oral  Oral Axillary  SpO2: 94%  93% 92%  Weight:      Height:        Wt Readings from Last 3 Encounters:  06/08/16 258 lb 6 oz  (117.2 kg)  03/10/12 255 lb 8.2 oz (115.9 kg)  02/05/12 261 lb 14.5 oz (118.8 kg)     Intake/Output Summary (Last 24 hours) at 06/09/16 1324 Last data filed at 06/09/16 0705  Gross per 24 hour  Intake             1845 ml  Output                0 ml  Net             1845 ml    Physical Exam:   GENERAL: Pleasant-appearing in no apparent distress.  HEAD, EYES,  EARS, NOSE AND THROAT: Atraumatic, normocephalic. Extraocular muscles are intact. Pupils equal and reactive to light. Sclerae anicteric. No conjunctival injection. No oro-pharyngeal erythema.  NECK: Supple. There is no jugular venous distention. No bruits, no lymphadenopathy, no thyromegaly.  HEART: Regular rate and rhythm,. No murmurs, no rubs, no clicks.  LUNGS: Rhonchus breath sounds b/l, no accessory muscle usage  ABDOMEN: Soft, flat, nontender, nondistended. Has good bowel sounds. No hepatosplenomegaly appreciated.  EXTREMITIES: No evidence of any cyanosis, clubbing, or peripheral edema.  +2 pedal and radial pulses bilaterally.  NEUROLOGIC: Resting tremor moving all extremities spontaneously SKIN: Moist and warm with no rashes appreciated.  Psych: Not anxious, depressed LN: No inguinal LN enlargement    Antibiotics   Anti-infectives    Start     Dose/Rate Route Frequency Ordered Stop   06/08/16 1600  vancomycin (VANCOCIN) IVPB 1000 mg/200 mL premix     1,000 mg 200 mL/hr over 60 Minutes Intravenous Every 24 hours 06/08/16 1241     06/08/16 0930  vancomycin (VANCOCIN) IVPB 1000 mg/200 mL premix     1,000 mg 200 mL/hr over 60 Minutes Intravenous  Once 06/08/16 0833 06/08/16 1116   06/08/16 0115  cefTRIAXone (ROCEPHIN) IVPB 1 g     1 g 100 mL/hr over 30 Minutes Intravenous  Once 06/08/16 0105 06/08/16 0138   06/08/16 0100  cefTRIAXone (ROCEPHIN) 1 g in dextrose 5 % 50 mL IVPB  Status:  Discontinued     1 g 100 mL/hr over 30 Minutes Intravenous  Once 06/08/16 0055 06/08/16 0105   06/08/16 0100  azithromycin  (ZITHROMAX) 500 mg in dextrose 5 % 250 mL IVPB     500 mg 250 mL/hr over 60 Minutes Intravenous  Once 06/08/16 0055 06/08/16 0234      Medications   Scheduled Meds: . carbidopa-levodopa  1 tablet Oral QID  . Chlorhexidine Gluconate Cloth  6 each Topical Q0600  . cyanocobalamin  1,000 mcg Intramuscular Q30 days  . diltiazem  360 mg Oral Daily  . divalproex  125 mg Oral BID  . enoxaparin (LOVENOX) injection  40 mg Subcutaneous BID  . fluticasone  2 spray Each Nare Daily  . furosemide  80 mg Oral Daily  . gabapentin  100 mg Oral TID  . insulin aspart  0-20 Units Subcutaneous TID WC  . insulin glargine  22 Units Subcutaneous QHS  . magnesium oxide  400 mg Oral BID  . mouth rinse  15 mL Mouth Rinse BID  . metoprolol tartrate  12.5 mg Oral BID  . multivitamin with minerals  1 tablet Oral Daily  . mupirocin ointment  1 application Nasal BID  . pantoprazole  40 mg Oral Daily  . pregabalin  25 mg Oral QPM  . senna-docusate  2 tablet Oral BID  . tiotropium  18 mcg Inhalation Daily  . vancomycin  1,000 mg Intravenous Q24H   Continuous Infusions: . sodium chloride 50 mL/hr at 06/09/16 0705   PRN Meds:.acetaminophen **OR** acetaminophen, HYDROcodone-acetaminophen, ipratropium-albuterol, ondansetron **OR** ondansetron (ZOFRAN) IV   Data Review:   Micro Results Recent Results (from the past 240 hour(s))  Blood Culture (routine x 2)     Status: None (Preliminary result)   Collection Time: 06/07/16 11:08 PM  Result Value Ref Range Status   Specimen Description BLOOD LEFT HAND  Final   Special Requests BOTTLES DRAWN AEROBIC AND ANAEROBIC 2CCAERO,2CCANA  Final   Culture NO GROWTH 1 DAY  Final   Report Status PENDING  Incomplete  Blood Culture (  routine x 2)     Status: None (Preliminary result)   Collection Time: 06/08/16 12:00 AM  Result Value Ref Range Status   Specimen Description BLOOD RIGHT HAND  Final   Special Requests BOTTLES DRAWN AEROBIC AND ANAEROBIC 4CCAERO,3CCANA  Final    Culture NO GROWTH 1 DAY  Final   Report Status PENDING  Incomplete  MRSA PCR Screening     Status: Abnormal   Collection Time: 06/08/16  4:31 AM  Result Value Ref Range Status   MRSA by PCR POSITIVE (A) NEGATIVE Final    Comment:        The GeneXpert MRSA Assay (FDA approved for NASAL specimens only), is one component of a comprehensive MRSA colonization surveillance program. It is not intended to diagnose MRSA infection nor to guide or monitor treatment for MRSA infections. RESULT CALLED TO, READ BACK BY AND VERIFIED WITH: PHYLLIS KING ON 06/08/16 AT 0612 BY [facility]     Radiology Reports Dg Chest 1 View  Result Date: 06/07/2016 CLINICAL DATA:  76 year old female with cough and fever EXAM: CHEST 1 VIEW COMPARISON:  Chest radiograph dated 02/29/2012 FINDINGS: There is stable mild cardiomegaly with pulmonary vascular congestion. Left lung base streaky density may represent atelectasis versus infiltrate. There is no pleural effusion or pneumothorax. There is mild cardiomegaly similar to prior radiograph. No acute osseous pathology identified. IMPRESSION: Cardiomegaly with mild vascular congestion. Left lung base density may represent atelectasis versus infiltrate. Clinical correlation is recommended. Electronically Signed   By: Anner Crete M.D.   On: 06/07/2016 23:40     CBC  Recent Labs Lab 06/08/16 0000 06/09/16 0431  WBC 6.3 5.2  HGB 12.0 12.4  HCT 36.6 36.2  PLT 143* 132*  MCV 90.1 89.6  MCH 29.6 30.7  MCHC 32.8 34.2  RDW 14.4 14.2  LYMPHSABS 0.9*  --   MONOABS 0.7  --   EOSABS 0.0  --   BASOSABS 0.1  --     Chemistries   Recent Labs Lab 06/07/16 2307 06/09/16 0431  NA 140 141  K 4.3 3.7  CL 100* 101  CO2 31 31  GLUCOSE 132* 123*  BUN 31* 26*  CREATININE 1.68* 1.50*  CALCIUM 7.8* 7.9*  AST 16  --   ALT <5*  --   ALKPHOS 60  --   BILITOT 0.7  --     ------------------------------------------------------------------------------------------------------------------ estimated creatinine clearance is 41.5 mL/min (by C-G formula based on SCr of 1.5 mg/dL (H)). ------------------------------------------------------------------------------------------------------------------  Recent Labs  06/07/16 2307  HGBA1C 6.3*   ------------------------------------------------------------------------------------------------------------------ No results for input(s): CHOL, HDL, LDLCALC, TRIG, CHOLHDL, LDLDIRECT in the last 72 hours. ------------------------------------------------------------------------------------------------------------------  Recent Labs  06/07/16 2307  TSH 1.516   ------------------------------------------------------------------------------------------------------------------ No results for input(s): VITAMINB12, FOLATE, FERRITIN, TIBC, IRON, RETICCTPCT in the last 72 hours.  Coagulation profile No results for input(s): INR, PROTIME in the last 168 hours.  No results for input(s): DDIMER in the last 72 hours.  Cardiac Enzymes  Recent Labs Lab 06/07/16 2307  TROPONINI <0.03   ------------------------------------------------------------------------------------------------------------------ Invalid input(s): POCBNP    Assessment & Plan   1. Pneumonia: Healthcare associated;Continue broad-spectrum antibiotics Patient continues to have fever but mentally improved continue current therapy 2. Urinary tract infection: Follow urine culture for growth and sensitivities. 3. Atrial fibrillation: Rate  Poor controlled; continue Cardizem and metoprolol.  Continue Xarelto 4. Chronic diastolic chf continue lasix 5. Diabetes mellitus type 2: continue sliding scale insulin while hospitalized. Neurontin and Lyrica for neuropathy 6. CKD: Stage III; avoid  nephrotoxic agents. Hold NSAIDs 7. COPD: Continue Spiriva 8. Parkinsonian  syndrome: Continue Sinemet 9. Pressure ulcers: Heels; bilaterally.  10. DVT prophylaxis: Systemic anticoagulation as above 11. GI prophylaxis: PPI per home regimen     Code Status Orders        Start     Ordered   06/08/16 0419  Do not attempt resuscitation (DNR)  Continuous    Question Answer Comment  In the event of cardiac or respiratory ARREST Do not call a "code blue"   In the event of cardiac or respiratory ARREST Do not perform Intubation, CPR, defibrillation or ACLS   In the event of cardiac or respiratory ARREST Use medication by any route, position, wound care, and other measures to relive pain and suffering. May use oxygen, suction and manual treatment of airway obstruction as needed for comfort.      06/08/16 0418    Code Status History    Date Active Date Inactive Code Status Order ID Comments User Context   06/08/2016  3:20 AM 06/08/2016  3:20 AM Full Code [id number]  Harrie Foreman, MD ED   02/27/2012  9:23 PM 03/10/2012  9:43 PM Full Code [id number]  Mignon Pine, RN Inpatient    Advance Directive Documentation   Flowsheet Row Most Recent Value  Type of Advance Directive  Healthcare Power of Attorney  Pre-existing out of facility DNR order (yellow form or pink MOST form)  No data  "MOST" Form in Place?  No data           Consults  none   DVT Prophylaxis  Lovenox    Lab Results  Component Value Date   PLT 132 (L) 06/09/2016     Time Spent in minutes 67min  Greater than 50% of time spent in care coordination and counseling patient regarding the condition and plan of care.   Dustin Flock M.D on 06/09/2016 at 1:24 PM  Between 7am to 6pm - Pager - [phone]  After 6pm go to [email or web address] - password [email or web address] [place] [facility]   Office  [phone]

## 2016-06-09 NOTE — Evaluation (Signed)
Clinical/Bedside Swallow Evaluation Patient Details  Name: Tina [name] MRN: [id number] Date of Birth: 1941/03/04  Today's Date: 06/09/2016 Time: SLP Start Time (ACUTE ONLY): 0930 SLP Stop Time (ACUTE ONLY): 1030 SLP Time Calculation (min) (ACUTE ONLY): 60 min  Past Medical History:  Past Medical History:  Diagnosis Date  . Atrial fibrillation ([place])   . Atrial flutter ([place])   . Chronic kidney disease    CKD stage 3  . Depression   . Diabetes mellitus   . Fecal incontinence 03/04/2012  . Heart murmur   . Hyperlipidemia   . Hypertension   . Normal exercise sestamibi stress test 2007   [facility]  . Pyelonephritis   . Renal stone   . Stroke [facility]) 06/2011   TIA   . Ureteral stone with hydronephrosis   . Urge incontinence 03/04/2012   Past Surgical History:  Past Surgical History:  Procedure Laterality Date  . ABDOMINAL HYSTERECTOMY    . BLADDER SURGERY  1994  . CYSTOSCOPY W/ URETERAL STENT PLACEMENT  02/27/2012   Procedure: CYSTOSCOPY WITH RETROGRADE PYELOGRAM/URETERAL STENT PLACEMENT;  Surgeon: Malka So, MD;  Location: [facility];  Service: Urology;  Laterality: Left;  cystoscopy, left rerograde pyelogram, insertion of left ureteral dual stents, and removal of bladder stone  . CYSTOSCOPY/RETROGRADE/URETEROSCOPY/STONE EXTRACTION WITH BASKET  03/04/2012   Procedure: CYSTOSCOPY/RETROGRADE/URETEROSCOPY/STONE EXTRACTION WITH BASKET;  Surgeon: Malka So, MD;  Location: [facility];  Service: Urology;  Laterality: Left;  LEFT URETEROSCOPIC STONE EXTRACTION     . KNEE SURGERY  as child   HPI:  The patient with past medical history of atrial fibrillation and diabetes presents emergency department complaining of weakness. She was afebrile on arrival but there was report of fever at her nursing facility. At some point endorses some shortness of breath but cannot contribute significantly to her history. She did not endorse shortness of breath to me. Upon arrival the patient barely met  criteria for atrial fibrillation with rapid ventricular rate which resolved without intervention. Laboratory evaluation was unremarkable but persistent tachypnea and chest x-ray revealing left lower lobe atelectasis versus infiltrate prompted the emergency department staff to treat for pneumonia prior to calling the hospitalist service for admission. Currently, pt appears to tolerate dysphagia 2 (well chopped) diet with thin liquids. Pt with oral motor invoiluntary tremors as well as upper extremity involuntary tremor.    Assessment / Plan / Recommendation Clinical Impression  Pt appears to present w/ increased risk for aspiration at this time d/t overall presentation w/ decline in respiratory status(baseline). Pt more alert to task of self-feeding and follow through w/ tasks of oral intake/trials of puree and thin consistency liquids helping to hold her Cup(and use straw) for drinking- increased moderate tremors noted in upper extremity that impacted self feeding. Pt consumed po trial consistencies given w/ no overt s/s of aspiration noted; oral phase adequate for bolus management and clearing. Recommend initiating an oral diet consistency of Dysphagia level 2 (well chopped) w/ Thin consistency liquids; aspiration precautions; Pills in Puree recommended Crushed; feeding support at all meals and supervision. Pt requires rest breaks during meals to avoid WOB/SOB. Baseline of upperextremity tremors and oral motor tremors requires assist at all meals. ST services will f/u w/ toleration of diet and trials to upgrade while admitted as indicated.    Aspiration Risk  Mild aspiration risk    Diet Recommendation   Recommend dysphagia level 2 diet (well chopped) with thin liquids following aspiration precautions. Meds given in puree (does not like applesauce)  Medication Administration: Whole meds with puree (Does not like applesauce)    Other  Recommendations Recommended Consults:  (dietician) Oral Care  Recommendations: Oral care BID;Staff/trained caregiver to provide oral care   Follow up Recommendations None      Frequency and Duration min 3x week  2 weeks       Prognosis Prognosis for Safe Diet Advancement: Good      Swallow Study   General Date of Onset: 06/07/16 HPI: The patient with past medical history of atrial fibrillation and diabetes presents emergency department complaining of weakness. She was afebrile on arrival but there was report of fever at her nursing facility. At some point endorses some shortness of breath but cannot contribute significantly to her history. She did not endorse shortness of breath to me. Upon arrival the patient barely met criteria for atrial fibrillation with rapid ventricular rate which resolved without intervention. Laboratory evaluation was unremarkable but persistent tachypnea and chest x-ray revealing left lower lobe atelectasis versus infiltrate prompted the emergency department staff to treat for pneumonia prior to calling the hospitalist service for admission. Currently, pt appears to tolerate dysphagia 2 (well chopped) diet with thin liquids. Pt with oral motor invoiluntary tremors as well as upper extremity involuntary tremor.  Type of Study: Bedside Swallow Evaluation Previous Swallow Assessment: none noted Diet Prior to this Study: Regular;Thin liquids Temperature Spikes Noted: No Respiratory Status: Nasal cannula History of Recent Intubation: No Behavior/Cognition: Alert;Cooperative;Pleasant mood;Distractible Oral Cavity Assessment: Within Functional Limits (mandiable and tongue involuntary tremor ) Oral Care Completed by SLP: Recent completion by staff Oral Cavity - Dentition: Dentures, top (no bottom dentition ) Vision: Functional for self-feeding Self-Feeding Abilities: Needs assist;Needs set up;Total assist Patient Positioning: Upright in bed;Postural control adequate for testing Baseline Vocal Quality: Normal (tense vocal  quality) Volitional Cough: Strong Volitional Swallow:  (dnt)    Oral/Motor/Sensory Function Overall Oral Motor/Sensory Function: Mild impairment Facial ROM: Within Functional Limits Facial Symmetry: Within Functional Limits Mandible: Impaired (involuntary tremor)   Ice Chips Ice chips: Impaired Presentation: Cup;Spoon (X3 trials- cough X1 ) Pharyngeal Phase Impairments: Cough - Immediate Other Comments: cough may not be related to swallow- o2 baseline of SOB and nasal cannula   Thin Liquid Thin Liquid: Within functional limits Presentation: Straw;Cup (X10 trials)    Nectar Thick Nectar Thick Liquid: Not tested   Honey Thick Honey Thick Liquid: Not tested   Puree Puree: Within functional limits Presentation: Spoon (X5 trials)   Solid   GO   Solid: Within functional limits Presentation: Spoon (X3 trails of soft solids)        Eulogio Ditch, B.S Graduate Clinician  06/09/2016,12:24 PM    This information has been reviewed and agreed upon by this supervising clinician.  Orinda Kenner, [place], CCC-SLP

## 2016-06-10 ENCOUNTER — Inpatient Hospital Stay: Payer: Medicare Other

## 2016-06-10 LAB — GLUCOSE, CAPILLARY
Glucose-Capillary: 115 mg/dL — ABNORMAL HIGH (ref 65–99)
Glucose-Capillary: 142 mg/dL — ABNORMAL HIGH (ref 65–99)
Glucose-Capillary: 95 mg/dL (ref 65–99)
Glucose-Capillary: 128 mg/dL — ABNORMAL HIGH (ref 65–99)

## 2016-06-10 MED ORDER — OXYCODONE-ACETAMINOPHEN 5-325 MG PO TABS
1.0000 | ORAL_TABLET | Freq: Four times a day (QID) | ORAL | Status: DC | PRN
  Administered 2016-06-11: 1 via ORAL
  Filled 2016-06-10: qty 1

## 2016-06-10 MED ORDER — IPRATROPIUM-ALBUTEROL 0.5-2.5 (3) MG/3ML IN SOLN
3.0000 mL | Freq: Four times a day (QID) | RESPIRATORY_TRACT | Status: DC
  Administered 2016-06-10 – 2016-06-13 (×12): 3 mL via RESPIRATORY_TRACT
  Filled 2016-06-10 (×11): qty 3

## 2016-06-10 MED ORDER — CEFEPIME-DEXTROSE 2 GM/50ML IV SOLR
2.0000 g | Freq: Two times a day (BID) | INTRAVENOUS | Status: AC
Start: 2016-06-10 — End: 2016-06-11
  Administered 2016-06-10 – 2016-06-11 (×2): 2 g via INTRAVENOUS
  Filled 2016-06-10 (×3): qty 50

## 2016-06-10 MED ORDER — MORPHINE SULFATE (PF) 2 MG/ML IV SOLN: 2.0000 mg | INTRAVENOUS | Status: DC | PRN

## 2016-06-10 MED ORDER — AZITHROMYCIN 500 MG PO TABS
500.0000 mg | ORAL_TABLET | Freq: Every day | ORAL | Status: AC
  Administered 2016-06-10: 20:00:00 500 mg via ORAL
  Filled 2016-06-10: qty 1

## 2016-06-10 MED ORDER — HYDROMORPHONE HCL 1 MG/ML IJ SOLN: 1.0000 mg | INTRAMUSCULAR | Status: DC | PRN

## 2016-06-10 MED ORDER — AZITHROMYCIN 250 MG PO TABS
250.0000 mg | ORAL_TABLET | Freq: Every day | ORAL | Status: DC
  Administered 2016-06-11 – 2016-06-13 (×3): 250 mg via ORAL
  Filled 2016-06-10 (×3): qty 1

## 2016-06-10 NOTE — Consult Note (Signed)
[facility] Infectious Disease     Reason for Consult: Pneumonia    Referring Physician: Serita Grit Date of Admission:  06/07/2016   Active Problems:   Pneumonia   Pressure injury of skin   HPI: Tina [name] is a 76 y.o. female admitted from NH with weakness and sob as well as afib. On admit wbc 6.3 and no fever but since admit has has fevers to 103. CXR showed L base opacity.  UA showed TNTC wbc but no ucx sent.  She was started on vanco and ceftriaxone/azithro initially. MRSA PCR + but bcx negative. Flu PCR neg. She has not received anything but vanco since 1/27.   CT done today shows RUL infiltrate.  History is obtained from family and chart as pt is quite sleepy.    Past Medical History:  Diagnosis Date  . Atrial fibrillation ([place])   . Atrial flutter ([place])   . Chronic kidney disease    CKD stage 3  . Depression   . Diabetes mellitus   . Fecal incontinence 03/04/2012  . Heart murmur   . Hyperlipidemia   . Hypertension   . Normal exercise sestamibi stress test 2007   [facility]  . Pyelonephritis   . Renal stone   . Stroke [facility]) 06/2011   TIA   . Ureteral stone with hydronephrosis   . Urge incontinence 03/04/2012   Past Surgical History:  Procedure Laterality Date  . ABDOMINAL HYSTERECTOMY    . BLADDER SURGERY  1994  . CYSTOSCOPY W/ URETERAL STENT PLACEMENT  02/27/2012   Procedure: CYSTOSCOPY WITH RETROGRADE PYELOGRAM/URETERAL STENT PLACEMENT;  Surgeon: Malka So, MD;  Location: [facility];  Service: Urology;  Laterality: Left;  cystoscopy, left rerograde pyelogram, insertion of left ureteral dual stents, and removal of bladder stone  . CYSTOSCOPY/RETROGRADE/URETEROSCOPY/STONE EXTRACTION WITH BASKET  03/04/2012   Procedure: CYSTOSCOPY/RETROGRADE/URETEROSCOPY/STONE EXTRACTION WITH BASKET;  Surgeon: Malka So, MD;  Location: [facility];  Service: Urology;  Laterality: Left;  LEFT URETEROSCOPIC STONE EXTRACTION     . KNEE SURGERY  as child   Social History  Substance Use  Topics  . Smoking status: Never Smoker  . Smokeless tobacco: Never Used  . Alcohol use No   Family History  Problem Relation Age of Onset  . Coronary artery disease Mother   . Diabetes Mother   . Heart disease Mother   . Cancer Father   . Diabetes Sister   . Hypertension Sister     Allergies:  Allergies  Allergen Reactions  . Morphine And Related     Current antibiotics: Antibiotics Given (last 72 hours)    Date/Time Action Medication Dose Rate   06/08/16 0108 Given   cefTRIAXone (ROCEPHIN) IVPB 1 g 1 g 100 mL/hr   06/08/16 1016 Given   vancomycin (VANCOCIN) IVPB 1000 mg/200 mL premix 1,000 mg 200 mL/hr   06/08/16 1647 Given   vancomycin (VANCOCIN) IVPB 1000 mg/200 mL premix 1,000 mg 200 mL/hr      MEDICATIONS: . carbidopa-levodopa  1 tablet Oral QID  . Chlorhexidine Gluconate Cloth  6 each Topical Q0600  . cyanocobalamin  1,000 mcg Intramuscular Q30 days  . diltiazem  360 mg Oral Daily  . divalproex  125 mg Oral BID  . enoxaparin (LOVENOX) injection  40 mg Subcutaneous BID  . feeding supplement (ENSURE ENLIVE)  237 mL Oral BID BM  . fluticasone  2 spray Each Nare Daily  . gabapentin  100 mg Oral TID  . insulin aspart  0-20 Units  Subcutaneous TID WC  . insulin glargine  22 Units Subcutaneous QHS  . magnesium oxide  400 mg Oral BID  . mouth rinse  15 mL Mouth Rinse BID  . metoprolol tartrate  12.5 mg Oral BID  . multivitamin with minerals  1 tablet Oral Daily  . mupirocin ointment  1 application Nasal BID  . pantoprazole  40 mg Oral Daily  . pregabalin  25 mg Oral QPM  . senna-docusate  2 tablet Oral BID  . tiotropium  18 mcg Inhalation Daily  . vancomycin  1,000 mg Intravenous Q24H    Review of Systems - unable to obtain   OBJECTIVE: Temp:  [98.5 F (36.9 C)-102.6 F (39.2 C)] 100.6 F (38.1 C) (01/30 1350) Pulse Rate:  [84-107] 93 (01/30 1350) Resp:  [18-20] 18 (01/30 1350) BP: (86-127)/(29-75) 119/29 (01/30 1350) SpO2:  [91 %-94 %] 91 % (01/30  1350) Physical Exam  Constitutional:  Obese, quite sleepy HENT: [place]/AT, PERRLA, no scleral icterus Mouth/Throat: Oropharynx is clear and dry. No oropharyngeal exudate.  Cardiovascular: tachy  Pulmonary/Chest: bil rhnohci and poor air movement Neck = supple, no nuchal rigidity Abdominal: Soft. Bowel sounds are normal.  exhibits no distension. There is no tenderness.  Lymphadenopathy: no cervical adenopathy. No axillary adenopathy Neurological: sleepy but wakes up to voice, talks some, Skin: Skin is warm and dry. No rash noted. No erythema.  Psychiatric: a normal mood and affect.  behavior is normal.    LABS: Results for orders placed or performed during the hospital encounter of 06/07/16 (from the past 48 hour(s))  Glucose, capillary     Status: None   Collection Time: 06/08/16  4:37 PM  Result Value Ref Range   Glucose-Capillary 89 65 - 99 mg/dL  Glucose, capillary     Status: Abnormal   Collection Time: 06/08/16 10:07 PM  Result Value Ref Range   Glucose-Capillary 116 (H) 65 - 99 mg/dL  CBC     Status: Abnormal   Collection Time: 06/09/16  4:31 AM  Result Value Ref Range   WBC 5.2 3.6 - 11.0 K/uL   RBC 4.04 3.80 - 5.20 MIL/uL   Hemoglobin 12.4 12.0 - 16.0 g/dL   HCT 36.2 35.0 - 47.0 %   MCV 89.6 80.0 - 100.0 fL   MCH 30.7 26.0 - 34.0 pg   MCHC 34.2 32.0 - 36.0 g/dL   RDW 14.2 11.5 - 14.5 %   Platelets 132 (L) 150 - 440 K/uL  Basic metabolic panel     Status: Abnormal   Collection Time: 06/09/16  4:31 AM  Result Value Ref Range   Sodium 141 135 - 145 mmol/L   Potassium 3.7 3.5 - 5.1 mmol/L   Chloride 101 101 - 111 mmol/L   CO2 31 22 - 32 mmol/L   Glucose, Bld 123 (H) 65 - 99 mg/dL   BUN 26 (H) 6 - 20 mg/dL   Creatinine, Ser 1.50 (H) 0.44 - 1.00 mg/dL   Calcium 7.9 (L) 8.9 - 10.3 mg/dL   GFR calc non Af Amer 33 (L) >60 mL/min   GFR calc Af Amer 38 (L) >60 mL/min    Comment: (NOTE) The eGFR has been calculated using the CKD EPI equation. This calculation has not been  validated in all clinical situations. eGFR's persistently <60 mL/min signify possible Chronic Kidney Disease.    Anion gap 9 5 - 15  Glucose, capillary     Status: Abnormal   Collection Time: 06/09/16  7:33 AM  Result  Value Ref Range   Glucose-Capillary 105 (H) 65 - 99 mg/dL  Glucose, capillary     Status: Abnormal   Collection Time: 06/09/16 11:47 AM  Result Value Ref Range   Glucose-Capillary 162 (H) 65 - 99 mg/dL  Glucose, capillary     Status: None   Collection Time: 06/09/16  4:44 PM  Result Value Ref Range   Glucose-Capillary 92 65 - 99 mg/dL  Glucose, capillary     Status: Abnormal   Collection Time: 06/09/16  9:35 PM  Result Value Ref Range   Glucose-Capillary 172 (H) 65 - 99 mg/dL  Glucose, capillary     Status: Abnormal   Collection Time: 06/10/16  7:24 AM  Result Value Ref Range   Glucose-Capillary 115 (H) 65 - 99 mg/dL  Glucose, capillary     Status: Abnormal   Collection Time: 06/10/16 11:33 AM  Result Value Ref Range   Glucose-Capillary 142 (H) 65 - 99 mg/dL   No components found for: ESR, C REACTIVE PROTEIN MICRO: Recent Results (from the past 720 hour(s))  Blood Culture (routine x 2)     Status: None (Preliminary result)   Collection Time: 06/07/16 11:08 PM  Result Value Ref Range Status   Specimen Description BLOOD LEFT HAND  Final   Special Requests BOTTLES DRAWN AEROBIC AND ANAEROBIC 2CCAERO,2CCANA  Final   Culture NO GROWTH 2 DAYS  Final   Report Status PENDING  Incomplete  Blood Culture (routine x 2)     Status: None (Preliminary result)   Collection Time: 06/08/16 12:00 AM  Result Value Ref Range Status   Specimen Description BLOOD RIGHT HAND  Final   Special Requests BOTTLES DRAWN AEROBIC AND ANAEROBIC 4CCAERO,3CCANA  Final   Culture NO GROWTH 2 DAYS  Final   Report Status PENDING  Incomplete  MRSA PCR Screening     Status: Abnormal   Collection Time: 06/08/16  4:31 AM  Result Value Ref Range Status   MRSA by PCR POSITIVE (A) NEGATIVE Final     Comment:        The GeneXpert MRSA Assay (FDA approved for NASAL specimens only), is one component of a comprehensive MRSA colonization surveillance program. It is not intended to diagnose MRSA infection nor to guide or monitor treatment for MRSA infections. RESULT CALLED TO, READ BACK BY AND VERIFIED WITH: PHYLLIS KING ON 06/08/16 AT 0612 BY [facility]     IMAGING: Ct Abdomen Pelvis Wo Contrast  Result Date: 06/10/2016 CLINICAL DATA:  Weakness, reported fever at nursing facility, shortness of breath, atrial fibrillation, persistent tachypnea, LEFT lower lobe atelectasis versus infiltrate, diabetes mellitus, hypertension, stage III chronic kidney disease EXAM: CT CHEST, ABDOMEN AND PELVIS WITHOUT CONTRAST TECHNIQUE: Multidetector CT imaging of the chest, abdomen and pelvis was performed following the standard protocol without IV contrast. Sagittal and coronal MPR images reconstructed from axial data set. No oral contrast administered. COMPARISON:  CT abdomen and pelvis 02/27/2012, no prior CT chest exams for comparison FINDINGS: CT CHEST FINDINGS Cardiovascular: Atherosclerotic calcifications aorta, proximal great vessels, minimally in coronary arteries. Minimal aneurysmal dilatation ascending thoracic aorta 4.0 cm transverse image 20. No pericardial effusion. Mediastinum/Nodes: Base of cervical region unremarkable. Small amount of air and fluid within esophagus. Mildly enlarged mediastinal lymph nodes 11 mm short axis RIGHT paratracheal image 13, 11 mm short axis precarinal image 17. Lungs/Pleura: Small BILATERAL pleural effusions dependently with adjacent atelectasis of the lower lobes. Consolidation RIGHT upper lobe with additional scattered areas of mild atelectasis in both upper lobes. No  pneumothorax. No definite pulmonary mass. Musculoskeletal: Diffuse osseous demineralization. CT ABDOMEN PELVIS FINDINGS Hepatobiliary: Dependent calcified gallstones within gallbladder. Liver unremarkable. Pancreas:  Atrophic Spleen: Normal appearance Adrenals/Urinary Tract: Adrenal glands normal appearance. BILATERAL renal cortical atrophy. Nonobstructing RIGHT renal calculus 6 mm diameter. Unremarkable ureters. Decompressed bladder. Stomach/Bowel: Appendix not visualized. GI tract suboptimally assessed due to lack of GI contrast and adequate distention, no gross acute abnormality seen Vascular/Lymphatic: Atherosclerotic calcification aorta without aneurysm. No adenopathy. Reproductive: Uterus surgically absent.  Normal sized ovaries. Other: No free air, free fluid, hernia, or acute inflammatory process. Musculoskeletal: Diffuse osseous demineralization with degenerative changes at both hip joints. IMPRESSION: RIGHT upper lobe infiltrate consistent with pneumonia. Minimally enlarged mediastinal lymph nodes, potentially reactive in the setting of acute infiltrate. Scattered atelectasis and small pleural effusions bilaterally. Cholelithiasis. Renal atrophy with nonobstructing 6 mm RIGHT renal calculus. Aortic atherosclerosis. No acute intra-abdominal or intrapelvic abnormalities. Electronically Signed   By: Lavonia Dana M.D.   On: 06/10/2016 15:00   Dg Chest 1 View  Result Date: 06/07/2016 CLINICAL DATA:  76 year old female with cough and fever EXAM: CHEST 1 VIEW COMPARISON:  Chest radiograph dated 02/29/2012 FINDINGS: There is stable mild cardiomegaly with pulmonary vascular congestion. Left lung base streaky density may represent atelectasis versus infiltrate. There is no pleural effusion or pneumothorax. There is mild cardiomegaly similar to prior radiograph. No acute osseous pathology identified. IMPRESSION: Cardiomegaly with mild vascular congestion. Left lung base density may represent atelectasis versus infiltrate. Clinical correlation is recommended. Electronically Signed   By: Anner Crete M.D.   On: 06/07/2016 23:40   Ct Chest Wo Contrast  Result Date: 06/10/2016 CLINICAL DATA:  Weakness, reported fever at  nursing facility, shortness of breath, atrial fibrillation, persistent tachypnea, LEFT lower lobe atelectasis versus infiltrate, diabetes mellitus, hypertension, stage III chronic kidney disease EXAM: CT CHEST, ABDOMEN AND PELVIS WITHOUT CONTRAST TECHNIQUE: Multidetector CT imaging of the chest, abdomen and pelvis was performed following the standard protocol without IV contrast. Sagittal and coronal MPR images reconstructed from axial data set. No oral contrast administered. COMPARISON:  CT abdomen and pelvis 02/27/2012, no prior CT chest exams for comparison FINDINGS: CT CHEST FINDINGS Cardiovascular: Atherosclerotic calcifications aorta, proximal great vessels, minimally in coronary arteries. Minimal aneurysmal dilatation ascending thoracic aorta 4.0 cm transverse image 20. No pericardial effusion. Mediastinum/Nodes: Base of cervical region unremarkable. Small amount of air and fluid within esophagus. Mildly enlarged mediastinal lymph nodes 11 mm short axis RIGHT paratracheal image 13, 11 mm short axis precarinal image 17. Lungs/Pleura: Small BILATERAL pleural effusions dependently with adjacent atelectasis of the lower lobes. Consolidation RIGHT upper lobe with additional scattered areas of mild atelectasis in both upper lobes. No pneumothorax. No definite pulmonary mass. Musculoskeletal: Diffuse osseous demineralization. CT ABDOMEN PELVIS FINDINGS Hepatobiliary: Dependent calcified gallstones within gallbladder. Liver unremarkable. Pancreas: Atrophic Spleen: Normal appearance Adrenals/Urinary Tract: Adrenal glands normal appearance. BILATERAL renal cortical atrophy. Nonobstructing RIGHT renal calculus 6 mm diameter. Unremarkable ureters. Decompressed bladder. Stomach/Bowel: Appendix not visualized. GI tract suboptimally assessed due to lack of GI contrast and adequate distention, no gross acute abnormality seen Vascular/Lymphatic: Atherosclerotic calcification aorta without aneurysm. No adenopathy.  Reproductive: Uterus surgically absent.  Normal sized ovaries. Other: No free air, free fluid, hernia, or acute inflammatory process. Musculoskeletal: Diffuse osseous demineralization with degenerative changes at both hip joints. IMPRESSION: RIGHT upper lobe infiltrate consistent with pneumonia. Minimally enlarged mediastinal lymph nodes, potentially reactive in the setting of acute infiltrate. Scattered atelectasis and small pleural effusions bilaterally. Cholelithiasis. Renal atrophy with nonobstructing 6  mm RIGHT renal calculus. Aortic atherosclerosis. No acute intra-abdominal or intrapelvic abnormalities. Electronically Signed   By: Lavonia Dana M.D.   On: 06/10/2016 15:00    Assessment:   Tameko Halder is a 76 y.o. female with RUL PNA and + UA on admission with no UCX.  She needs treatment for HCAP but has only been on Vanco since day after admit. Flu PCR neg on admit. CT shows RUL PNA. CT abd neg.   Recommendations Start cefepime and azithro Continue vanco Have ordered sputum if patient can produce one.  If remains febrile would repeat flu PCR.  Thank you very much for allowing me to participate in the care of this patient. Please call with questions.   Cheral Marker. Ola Spurr, MD

## 2016-06-10 NOTE — Progress Notes (Signed)
[place] [facility]                                                                                                                                                                                  Patient Demographics   Tina [name], is a 76 y.o. female, DOB - 01-Dec-1940, [id number]  Admit date - 06/07/2016   Admitting Physician Harrie Foreman, MD  Outpatient Primary MD for the patient is Pcp Not In System   LOS - 2  Subjective: Patient complaining of pain in her back and abdomen pain, he continues to have fever son at bedside is concerned that his mom is hallucinating   Review of Systems:    CONSTITUTIONAL: Positive fever. Positive fatigue, positive weakness. No weight gain, no weight loss.  EYES: No blurry or double vision.  ENT: No tinnitus. No postnasal drip. No redness of the oropharynx.  RESPIRATORY: Positive cough, no wheeze, no hemoptysis. Positive dyspnea.  CARDIOVASCULAR: No chest pain. No orthopnea. No palpitations. No syncope.  GASTROINTESTINAL: No nausea, no vomiting or diarrhea. No abdominal pain. No melena or hematochezia.  GENITOURINARY:  No urgency. No frequency. No dysuria. No hematuria. No obstructive symptoms. No discharge. No pain. No significant abnormal bleeding ENDOCRINE: No polyuria or nocturia. No heat or cold intolerance.  HEMATOLOGY: No anemia. No bruising. No bleeding. No purpura. No petechiae INTEGUMENTARY: No rashes. No lesions.  MUSCULOSKELETAL: No arthritis. No swelling. No gout.  NEUROLOGIC: No numbness, tingling, or ataxia. No seizure-type activity.  PSYCHIATRIC: No anxiety. No insomnia. No ADD.     Vitals:   Vitals:   06/10/16 0321 06/10/16 0540 06/10/16 0920 06/10/16 1350  BP: 117/62 (!) 127/59 119/75 (!) 119/29  Pulse: 85 92 (!) 107 93  Resp:   20 18  Temp:  98.5 F (36.9 C) 100.3 F (37.9 C) (!) 100.6 F (38.1 C)  TempSrc:  Oral Oral Oral  SpO2:   94% 91%  Weight:      Height:         Wt Readings from Last 3 Encounters:  06/08/16 258 lb 6 oz (117.2 kg)  03/10/12 255 lb 8.2 oz (115.9 kg)  02/05/12 261 lb 14.5 oz (118.8 kg)     Intake/Output Summary (Last 24 hours) at 06/10/16 1355 Last data filed at 06/10/16 0933  Gross per 24 hour  Intake           1272.5 ml  Output                0 ml  Net           1272.5 ml    Physical Exam:   GENERAL: Pleasant-appearing  in no apparent distress.  HEAD, EYES, EARS, NOSE AND THROAT: Atraumatic, normocephalic. Extraocular muscles are intact. Pupils equal and reactive to light. Sclerae anicteric. No conjunctival injection. No oro-pharyngeal erythema.  NECK: Supple. There is no jugular venous distention. No bruits, no lymphadenopathy, no thyromegaly.  HEART: Regular rate and rhythm,. No murmurs, no rubs, no clicks.  LUNGS: Rhonchus breath sounds b/l, no accessory muscle usage  ABDOMEN: Soft, flat, nontender, nondistended. Has good bowel sounds. No hepatosplenomegaly appreciated.  EXTREMITIES: No evidence of any cyanosis, clubbing, or peripheral edema.  +2 pedal and radial pulses bilaterally. Has dressing in both lower ext  NEUROLOGIC: Resting tremor moving all extremities spontaneously SKIN: Moist and warm with no rashes appreciated.  Psych: Not anxious, depressed LN: No inguinal LN enlargement    Antibiotics   Anti-infectives    Start     Dose/Rate Route Frequency Ordered Stop   06/08/16 1600  vancomycin (VANCOCIN) IVPB 1000 mg/200 mL premix     1,000 mg 200 mL/hr over 60 Minutes Intravenous Every 24 hours 06/08/16 1241     06/08/16 0930  vancomycin (VANCOCIN) IVPB 1000 mg/200 mL premix     1,000 mg 200 mL/hr over 60 Minutes Intravenous  Once 06/08/16 0833 06/08/16 1116   06/08/16 0115  cefTRIAXone (ROCEPHIN) IVPB 1 g     1 g 100 mL/hr over 30 Minutes Intravenous  Once 06/08/16 0105 06/08/16 0138   06/08/16 0100  cefTRIAXone (ROCEPHIN) 1 g in dextrose 5 % 50 mL IVPB  Status:  Discontinued     1 g 100 mL/hr over 30  Minutes Intravenous  Once 06/08/16 0055 06/08/16 0105   06/08/16 0100  azithromycin (ZITHROMAX) 500 mg in dextrose 5 % 250 mL IVPB     500 mg 250 mL/hr over 60 Minutes Intravenous  Once 06/08/16 0055 06/08/16 0234      Medications   Scheduled Meds: . carbidopa-levodopa  1 tablet Oral QID  . Chlorhexidine Gluconate Cloth  6 each Topical Q0600  . cyanocobalamin  1,000 mcg Intramuscular Q30 days  . diltiazem  360 mg Oral Daily  . divalproex  125 mg Oral BID  . enoxaparin (LOVENOX) injection  40 mg Subcutaneous BID  . feeding supplement (ENSURE ENLIVE)  237 mL Oral BID BM  . fluticasone  2 spray Each Nare Daily  . gabapentin  100 mg Oral TID  . insulin aspart  0-20 Units Subcutaneous TID WC  . insulin glargine  22 Units Subcutaneous QHS  . magnesium oxide  400 mg Oral BID  . mouth rinse  15 mL Mouth Rinse BID  . metoprolol tartrate  12.5 mg Oral BID  . multivitamin with minerals  1 tablet Oral Daily  . mupirocin ointment  1 application Nasal BID  . pantoprazole  40 mg Oral Daily  . pregabalin  25 mg Oral QPM  . senna-docusate  2 tablet Oral BID  . tiotropium  18 mcg Inhalation Daily  . vancomycin  1,000 mg Intravenous Q24H   Continuous Infusions: . sodium chloride 50 mL/hr at 06/10/16 0933   PRN Meds:.acetaminophen **OR** acetaminophen, HYDROcodone-acetaminophen, ipratropium-albuterol, morphine injection, ondansetron **OR** ondansetron (ZOFRAN) IV, oxyCODONE-acetaminophen   Data Review:   Micro Results Recent Results (from the past 240 hour(s))  Blood Culture (routine x 2)     Status: None (Preliminary result)   Collection Time: 06/07/16 11:08 PM  Result Value Ref Range Status   Specimen Description BLOOD LEFT HAND  Final   Special Requests BOTTLES DRAWN AEROBIC AND ANAEROBIC 2CCAERO,2CCANA  Final   Culture NO GROWTH 2 DAYS  Final   Report Status PENDING  Incomplete  Blood Culture (routine x 2)     Status: None (Preliminary result)   Collection Time: 06/08/16 12:00 AM   Result Value Ref Range Status   Specimen Description BLOOD RIGHT HAND  Final   Special Requests BOTTLES DRAWN AEROBIC AND ANAEROBIC 4CCAERO,3CCANA  Final   Culture NO GROWTH 2 DAYS  Final   Report Status PENDING  Incomplete  MRSA PCR Screening     Status: Abnormal   Collection Time: 06/08/16  4:31 AM  Result Value Ref Range Status   MRSA by PCR POSITIVE (A) NEGATIVE Final    Comment:        The GeneXpert MRSA Assay (FDA approved for NASAL specimens only), is one component of a comprehensive MRSA colonization surveillance program. It is not intended to diagnose MRSA infection nor to guide or monitor treatment for MRSA infections. RESULT CALLED TO, READ BACK BY AND VERIFIED WITH: PHYLLIS KING ON 06/08/16 AT 0612 BY [facility] [place]     Radiology Reports Dg Chest 1 View  Result Date: 06/07/2016 CLINICAL DATA:  76 year old female with cough and fever EXAM: CHEST 1 VIEW COMPARISON:  Chest radiograph dated 02/29/2012 FINDINGS: There is stable mild cardiomegaly with pulmonary vascular congestion. Left lung base streaky density may represent atelectasis versus infiltrate. There is no pleural effusion or pneumothorax. There is mild cardiomegaly similar to prior radiograph. No acute osseous pathology identified. IMPRESSION: Cardiomegaly with mild vascular congestion. Left lung base density may represent atelectasis versus infiltrate. Clinical correlation is recommended. Electronically Signed   By: Anner Crete M.D.   On: 06/07/2016 23:40     CBC  Recent Labs Lab 06/08/16 0000 06/09/16 0431  WBC 6.3 5.2  HGB 12.0 12.4  HCT 36.6 36.2  PLT 143* 132*  MCV 90.1 89.6  MCH 29.6 30.7  MCHC 32.8 34.2  RDW 14.4 14.2  LYMPHSABS 0.9*  --   MONOABS 0.7  --   EOSABS 0.0  --   BASOSABS 0.1  --     Chemistries   Recent Labs Lab 06/07/16 2307 06/09/16 0431  NA 140 141  K 4.3 3.7  CL 100* 101  CO2 31 31  GLUCOSE 132* 123*  BUN 31* 26*  CREATININE 1.68* 1.50*  CALCIUM 7.8* 7.9*  AST 16  --    ALT <5*  --   ALKPHOS 60  --   BILITOT 0.7  --    ------------------------------------------------------------------------------------------------------------------ estimated creatinine clearance is 41.5 mL/min (by C-G formula based on SCr of 1.5 mg/dL (H)). ------------------------------------------------------------------------------------------------------------------  Recent Labs  06/07/16 2307  HGBA1C 6.3*   ------------------------------------------------------------------------------------------------------------------ No results for input(s): CHOL, HDL, LDLCALC, TRIG, CHOLHDL, LDLDIRECT in the last 72 hours. ------------------------------------------------------------------------------------------------------------------  Recent Labs  06/07/16 2307  TSH 1.516   ------------------------------------------------------------------------------------------------------------------ No results for input(s): VITAMINB12, FOLATE, FERRITIN, TIBC, IRON, RETICCTPCT in the last 72 hours.  Coagulation profile No results for input(s): INR, PROTIME in the last 168 hours.  No results for input(s): DDIMER in the last 72 hours.  Cardiac Enzymes  Recent Labs Lab 06/07/16 2307  TROPONINI <0.03   ------------------------------------------------------------------------------------------------------------------ Invalid input(s): POCBNP    Assessment & Plan   1. Pneumonia: Healthcare associated;Continue broad-spectrum antibiotics, patient continue to have fever, I will order CT of chest and abdomen and ask ID to see, if ct chest and abd negative, will obtain doppler of lower ext r/o dvt 2. Urinary tract infection: I do not see urine culture results  back. I'll reorder 3. Atrial fibrillation: Rate  Poor controlled; continue Cardizem and metoprolol.  Continue Xarelto heart rate 4. Chronic diastolic chf continue lasix 5. Diabetes mellitus type 2: continue sliding scale insulin while  hospitalized. Neurontin and Lyrica for neuropathy 6. CKD: Stage III; avoid nephrotoxic agents. Hold NSAIDs 7. COPD: Continue Spiriva 8. Parkinsonian syndrome: Continue Sinemet 9. Pressure ulcers: Heels; bilaterally.  10. DVT prophylaxis: Systemic anticoagulation as above 11. GI prophylaxis: PPI per home regimen 12. CODE STATUS DO NOT RESUSCITATE prognosis very poor patient was being followed by palliative care at the skilled nursing facility. Apparently the plan was patient to not return to the hospital however she was brought to the hospital I'll last palliative care team to follow again to decide what makes to happen in the near future     Code Status Orders        Start     Ordered   06/08/16 0419  Do not attempt resuscitation (DNR)  Continuous    Question Answer Comment  In the event of cardiac or respiratory ARREST Do not call a "code blue"   In the event of cardiac or respiratory ARREST Do not perform Intubation, CPR, defibrillation or ACLS   In the event of cardiac or respiratory ARREST Use medication by any route, position, wound care, and other measures to relive pain and suffering. May use oxygen, suction and manual treatment of airway obstruction as needed for comfort.      06/08/16 0418    Code Status History    Date Active Date Inactive Code Status Order ID Comments User Context   06/08/2016  3:20 AM 06/08/2016  3:20 AM Full Code [id number]  Harrie Foreman, MD ED   02/27/2012  9:23 PM 03/10/2012  9:43 PM Full Code [id number]  Mignon Pine, RN Inpatient    Advance Directive Documentation   Flowsheet Row Most Recent Value  Type of Advance Directive  Healthcare Power of Attorney  Pre-existing out of facility DNR order (yellow form or pink MOST form)  No data  "MOST" Form in Place?  No data           Consults  none   DVT Prophylaxis  Lovenox    Lab Results  Component Value Date   PLT 132 (L) 06/09/2016     Time Spent in minutes 24min  Greater  than 50% of time spent in care coordination and counseling patient regarding the condition and plan of care.   Dustin Flock M.D on 06/10/2016 at 1:55 PM  Between 7am to 6pm - Pager - [phone]  After 6pm go to [email or web address] - password [email or web address] [facility]   Office  [phone]

## 2016-06-10 NOTE — Care Management Important Message (Signed)
Important Message  Patient Details  Name: Tina [name] MRN: [id number] Date of Birth: 02/04/1941   Medicare Important Message Given:  Yes    Shelbie Ammons, RN 06/10/2016, 12:07 PM

## 2016-06-10 NOTE — Progress Notes (Signed)
SLP Cancellation Note  Patient Details Name: Tina [name] MRN: [id number] DOB: 1940/08/14   Cancelled treatment:       Reason Eval/Treat Not Completed: Patient declined, no reason specified (Pt reported not feeling well. ) Pt reported not feeling well all morning and getting sick. Pt noted no trouble with last nights meal stating "everything went down okay" with no choking or coughing. Pt denied skilled ST services stating she did not want to eat or drink anything with SLP or for lunch meal. Dialectician was contacted to consult pt to assure adequate PO intake for nutritional needs.    Eulogio Ditch, B.S Graduate Clinician  06/10/2016, 3:46 PM   This information has been reviewed and agreed upon by this supervising clinician.  Orinda Kenner, [place], CCC-SLP

## 2016-06-10 NOTE — Progress Notes (Signed)
Pharmacy Antibiotic Note  Tina [name] is a 76 y.o. female admitted on 06/07/2016 with pneumonia.  Pharmacy has been consulted for cefepime dosing.  ID consulted and saw patient today. Antibiotics are being broadened from vancomycin only to vancomycin, azithromycin, and cefepime.  Plan: Continue vancomycin 1000 mg IV q24h as ordered Vancomycin trough ordered for 1/31 at 1530  Will order cefepime 2 g IV q12h  Height: 5\' 5"  (165.1 cm) Weight: 258 lb 6 oz (117.2 kg) IBW/kg (Calculated) : 57  Temp (24hrs), Avg:99.7 F (37.6 C), Min:98.5 F (36.9 C), Max:100.6 F (38.1 C)   Recent Labs Lab 06/07/16 2307 06/07/16 2308 06/08/16 0000 06/09/16 0431  WBC  --   --  6.3 5.2  CREATININE 1.68*  --   --  1.50*  LATICACIDVEN  --  2.3*  --   --     Estimated Creatinine Clearance: 41.5 mL/min (by C-G formula based on SCr of 1.5 mg/dL (H)).    Allergies  Allergen Reactions  . Morphine And Related    Antimicrobials this admission: cefepime 1/30 >>  vancomycin 1/28 >>  Azithromycin 1/30 >>  Dose adjustments this admission:  Microbiology results: 1/28 BCx: No growth 2 days 1/30 UCx: Sent  1/30 Sputum: Ordered  1/28 MRSA PCR: Positive  Thank you for allowing pharmacy to be a part of this patient's care.  Lenis Noon, PharmD, BCPS Clinical Pharmacist 06/10/2016 8:04 PM

## 2016-06-11 DIAGNOSIS — Z7189 Other specified counseling: Secondary | ICD-10-CM

## 2016-06-11 DIAGNOSIS — F0280 Dementia in other diseases classified elsewhere without behavioral disturbance: Secondary | ICD-10-CM

## 2016-06-11 DIAGNOSIS — G3183 Dementia with Lewy bodies: Secondary | ICD-10-CM

## 2016-06-11 DIAGNOSIS — J189 Pneumonia, unspecified organism: Secondary | ICD-10-CM

## 2016-06-11 DIAGNOSIS — Z66 Do not resuscitate: Secondary | ICD-10-CM

## 2016-06-11 DIAGNOSIS — J181 Lobar pneumonia, unspecified organism: Secondary | ICD-10-CM

## 2016-06-11 DIAGNOSIS — Z515 Encounter for palliative care: Secondary | ICD-10-CM

## 2016-06-11 LAB — BASIC METABOLIC PANEL
Anion gap: 8 (ref 5–15)
BUN: 26 mg/dL — ABNORMAL HIGH (ref 6–20)
CALCIUM: 7.8 mg/dL — AB (ref 8.9–10.3)
CO2: 32 mmol/L (ref 22–32)
CREATININE: 1.33 mg/dL — AB (ref 0.44–1.00)
Chloride: 101 mmol/L (ref 101–111)
GFR calc non Af Amer: 38 mL/min — ABNORMAL LOW (ref >60)
GFR, EST AFRICAN AMERICAN: 44 mL/min — AB (ref >60)
Glucose, Bld: 96 mg/dL (ref 65–99)
Potassium: 3.3 mmol/L — ABNORMAL LOW (ref 3.5–5.1)
SODIUM: 141 mmol/L (ref 135–145)

## 2016-06-11 LAB — GLUCOSE, CAPILLARY
Glucose-Capillary: 77 mg/dL (ref 65–99)
Glucose-Capillary: 102 mg/dL — ABNORMAL HIGH (ref 65–99)
Glucose-Capillary: 121 mg/dL — ABNORMAL HIGH (ref 65–99)
Glucose-Capillary: 116 mg/dL — ABNORMAL HIGH (ref 65–99)
Glucose-Capillary: 83 mg/dL (ref 65–99)

## 2016-06-11 LAB — CBC
HEMATOCRIT: 31.6 % — AB (ref 35.0–47.0)
Hemoglobin: 10.7 g/dL — ABNORMAL LOW (ref 12.0–16.0)
MCH: 30 pg (ref 26.0–34.0)
MCHC: 33.9 g/dL (ref 32.0–36.0)
MCV: 88.7 fL (ref 80.0–100.0)
PLATELETS: 123 10*3/uL — AB (ref 150–440)
RBC: 3.56 MIL/uL — ABNORMAL LOW (ref 3.80–5.20)
RDW: 14.1 % (ref 11.5–14.5)
WBC: 2.8 10*3/uL — ABNORMAL LOW (ref 3.6–11.0)

## 2016-06-11 LAB — VANCOMYCIN, TROUGH: Vancomycin Tr: 23 ug/mL (ref 15–20)

## 2016-06-11 MED ORDER — VANCOMYCIN HCL IN DEXTROSE 750-5 MG/150ML-% IV SOLN
750.0000 mg | INTRAVENOUS | Status: DC
  Administered 2016-06-12: 23:00:00 750 mg via INTRAVENOUS
  Filled 2016-06-11 (×2): qty 150

## 2016-06-11 MED ORDER — DEXTROSE 5 % IV SOLN
2.0000 g | Freq: Two times a day (BID) | INTRAVENOUS | Status: DC
  Administered 2016-06-11 – 2016-06-13 (×4): 2 g via INTRAVENOUS
  Filled 2016-06-11 (×6): qty 2
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
## 2016-06-11 NOTE — Progress Notes (Signed)
Pharmacy Antibiotic Note  Tina [name] is a 76 y.o. female admitted on 06/07/2016 with pneumonia.  Pharmacy has been consulted for cefepime and vancomycin dosing. Pt received one dose of azithromycin and ceftriaxone on 1/28.  ID consulted and saw patient yesterday. Antibiotics broadened from vancomycin only to vancomycin, azithromycin, and cefepime.  Plan: 1/31 VT = 23 mcg/mL. This evenings dose was given prior to VT resulting.  Will decrease dose of vancomycin to 750 mg IV q24h beginning tomorrow night.   Continue cefepime 2 g IV q12h Patient also has z pak ordered  Height: 5\' 5"  (165.1 cm) Weight: 267 lb (121.1 kg) IBW/kg (Calculated) : 57  Temp (24hrs), Avg:98.8 F (37.1 C), Min:98.4 F (36.9 C), Max:99.5 F (37.5 C)   Recent Labs Lab 06/07/16 2307 06/07/16 2308 06/08/16 0000 06/09/16 0431 06/11/16 0343 06/11/16 1548  WBC  --   --  6.3 5.2 2.8*  --   CREATININE 1.68*  --   --  1.50* 1.33*  --   LATICACIDVEN  --  2.3*  --   --   --   --   VANCOTROUGH  --   --   --   --   --  23*    Estimated Creatinine Clearance: 47.7 mL/min (by C-G formula based on SCr of 1.33 mg/dL (H)).    Allergies  Allergen Reactions  . Morphine And Related    Antimicrobials this admission: cefepime 1/30 >>  vancomycin 1/28 >>  Azithromycin 1/30 >>  Dose adjustments this admission:  Microbiology results: 1/28 BCx: No growth 3 days 1/30 UCx: Sent  1/30 Sputum: Sent 1/28 MRSA PCR: Positive  Thank you for allowing pharmacy to be a part of this patient's care.  Lenis Noon, PharmD 06/11/2016 7:31 PM

## 2016-06-11 NOTE — Progress Notes (Signed)
[place] INFECTIOUS DISEASE PROGRESS NOTE Date of Admission:  06/07/2016     ID: Tina [name] is a 76 y.o. female with   PNA Active Problems:   Pneumonia   Pressure injury of skin   Subjective: Fevers improving.   ROS  Eleven systems are reviewed and negative except per hpi  Medications:  Antibiotics Given (last 72 hours)    Date/Time Action Medication Dose Rate   06/08/16 1647 Given   vancomycin (VANCOCIN) IVPB 1000 mg/200 mL premix 1,000 mg 200 mL/hr   06/10/16 1841 Given   vancomycin (VANCOCIN) IVPB 1000 mg/200 mL premix 1,000 mg 200 mL/hr   06/10/16 1952 Given   azithromycin (ZITHROMAX) tablet 500 mg 500 mg    06/10/16 2120 Given   ceFEPIme (MAXIPIME) 2 GM / 74mL IVPB premix 2 g 100 mL/hr   06/11/16 0920 Given   ceFEPIme (MAXIPIME) 2 GM / 50mL IVPB premix 2 g 100 mL/hr   06/11/16 1109 Given   azithromycin (ZITHROMAX) tablet 250 mg 250 mg      . azithromycin  250 mg Oral Daily  . carbidopa-levodopa  1 tablet Oral QID  . ceFEPime (MAXIPIME) IV  2 g Intravenous Q12H  . Chlorhexidine Gluconate Cloth  6 each Topical Q0600  . cyanocobalamin  1,000 mcg Intramuscular Q30 days  . diltiazem  360 mg Oral Daily  . divalproex  125 mg Oral BID  . enoxaparin (LOVENOX) injection  40 mg Subcutaneous BID  . feeding supplement (ENSURE ENLIVE)  237 mL Oral BID BM  . fluticasone  2 spray Each Nare Daily  . gabapentin  100 mg Oral TID  . insulin aspart  0-20 Units Subcutaneous TID WC  . insulin glargine  22 Units Subcutaneous QHS  . ipratropium-albuterol  3 mL Nebulization Q6H  . magnesium oxide  400 mg Oral BID  . mouth rinse  15 mL Mouth Rinse BID  . metoprolol tartrate  12.5 mg Oral BID  . multivitamin with minerals  1 tablet Oral Daily  . mupirocin ointment  1 application Nasal BID  . pantoprazole  40 mg Oral Daily  . pregabalin  25 mg Oral QPM  . senna-docusate  2 tablet Oral BID  . tiotropium  18 mcg Inhalation Daily  . vancomycin  1,000 mg Intravenous Q24H     Objective: Vital signs in last 24 hours: Temp:  [98.4 F (36.9 C)-100.6 F (38.1 C)] 98.4 F (36.9 C) (01/31 0907) Pulse Rate:  [73-98] 98 (01/31 0920) Resp:  [16-20] 17 (01/31 0636) BP: (99-121)/(29-68) 102/49 (01/31 0920) SpO2:  [90 %-96 %] 96 % (01/31 0907) Weight:  [121.1 kg (267 lb)] 121.1 kg (267 lb) (01/31 0500) Constitutional:  Obese, quite sleepy HENT: [place]/AT, PERRLA, no scleral icterus Mouth/Throat: Oropharynx is clear and dry. No oropharyngeal exudate.  Cardiovascular: tachy  Pulmonary/Chest: bil rhnohci and poor air movement Neck = supple, no nuchal rigidity Abdominal: Soft. Bowel sounds are normal.  exhibits no distension. There is no tenderness.  Lymphadenopathy: no cervical adenopathy. No axillary adenopathy Neurological: sleepy but wakes up to voice, talks some, Skin: Skin is warm and dry. No rash noted. No erythema.  Psychiatric: a normal mood and affect.  behavior is normal.   Lab Results  Recent Labs  06/09/16 0431 06/11/16 0343  WBC 5.2 2.8*  HGB 12.4 10.7*  HCT 36.2 31.6*  NA 141 141  K 3.7 3.3*  CL 101 101  CO2 31 32  BUN 26* 26*  CREATININE 1.50* 1.33*    Microbiology:  Results for orders placed or performed during the hospital encounter of 06/07/16  Blood Culture (routine x 2)     Status: None (Preliminary result)   Collection Time: 06/07/16 11:08 PM  Result Value Ref Range Status   Specimen Description BLOOD LEFT HAND  Final   Special Requests BOTTLES DRAWN AEROBIC AND ANAEROBIC 2CCAERO,2CCANA  Final   Culture NO GROWTH 3 DAYS  Final   Report Status PENDING  Incomplete  Blood Culture (routine x 2)     Status: None (Preliminary result)   Collection Time: 06/08/16 12:00 AM  Result Value Ref Range Status   Specimen Description BLOOD RIGHT HAND  Final   Special Requests BOTTLES DRAWN AEROBIC AND ANAEROBIC 4CCAERO,3CCANA  Final   Culture NO GROWTH 3 DAYS  Final   Report Status PENDING  Incomplete  MRSA PCR Screening     Status: Abnormal    Collection Time: 06/08/16  4:31 AM  Result Value Ref Range Status   MRSA by PCR POSITIVE (A) NEGATIVE Final    Comment:        The GeneXpert MRSA Assay (FDA approved for NASAL specimens only), is one component of a comprehensive MRSA colonization surveillance program. It is not intended to diagnose MRSA infection nor to guide or monitor treatment for MRSA infections. RESULT CALLED TO, READ BACK BY AND VERIFIED WITH: PHYLLIS KING ON 06/08/16 AT 0612 BY [facility]     Studies/Results: Ct Abdomen Pelvis Wo Contrast  Result Date: 06/10/2016 CLINICAL DATA:  Weakness, reported fever at nursing facility, shortness of breath, atrial fibrillation, persistent tachypnea, LEFT lower lobe atelectasis versus infiltrate, diabetes mellitus, hypertension, stage III chronic kidney disease EXAM: CT CHEST, ABDOMEN AND PELVIS WITHOUT CONTRAST TECHNIQUE: Multidetector CT imaging of the chest, abdomen and pelvis was performed following the standard protocol without IV contrast. Sagittal and coronal MPR images reconstructed from axial data set. No oral contrast administered. COMPARISON:  CT abdomen and pelvis 02/27/2012, no prior CT chest exams for comparison FINDINGS: CT CHEST FINDINGS Cardiovascular: Atherosclerotic calcifications aorta, proximal great vessels, minimally in coronary arteries. Minimal aneurysmal dilatation ascending thoracic aorta 4.0 cm transverse image 20. No pericardial effusion. Mediastinum/Nodes: Base of cervical region unremarkable. Small amount of air and fluid within esophagus. Mildly enlarged mediastinal lymph nodes 11 mm short axis RIGHT paratracheal image 13, 11 mm short axis precarinal image 17. Lungs/Pleura: Small BILATERAL pleural effusions dependently with adjacent atelectasis of the lower lobes. Consolidation RIGHT upper lobe with additional scattered areas of mild atelectasis in both upper lobes. No pneumothorax. No definite pulmonary mass. Musculoskeletal: Diffuse osseous  demineralization. CT ABDOMEN PELVIS FINDINGS Hepatobiliary: Dependent calcified gallstones within gallbladder. Liver unremarkable. Pancreas: Atrophic Spleen: Normal appearance Adrenals/Urinary Tract: Adrenal glands normal appearance. BILATERAL renal cortical atrophy. Nonobstructing RIGHT renal calculus 6 mm diameter. Unremarkable ureters. Decompressed bladder. Stomach/Bowel: Appendix not visualized. GI tract suboptimally assessed due to lack of GI contrast and adequate distention, no gross acute abnormality seen Vascular/Lymphatic: Atherosclerotic calcification aorta without aneurysm. No adenopathy. Reproductive: Uterus surgically absent.  Normal sized ovaries. Other: No free air, free fluid, hernia, or acute inflammatory process. Musculoskeletal: Diffuse osseous demineralization with degenerative changes at both hip joints. IMPRESSION: RIGHT upper lobe infiltrate consistent with pneumonia. Minimally enlarged mediastinal lymph nodes, potentially reactive in the setting of acute infiltrate. Scattered atelectasis and small pleural effusions bilaterally. Cholelithiasis. Renal atrophy with nonobstructing 6 mm RIGHT renal calculus. Aortic atherosclerosis. No acute intra-abdominal or intrapelvic abnormalities. Electronically Signed   By: Lavonia Dana M.D.   On: 06/10/2016 15:00  Ct Chest Wo Contrast  Result Date: 06/10/2016 CLINICAL DATA:  Weakness, reported fever at nursing facility, shortness of breath, atrial fibrillation, persistent tachypnea, LEFT lower lobe atelectasis versus infiltrate, diabetes mellitus, hypertension, stage III chronic kidney disease EXAM: CT CHEST, ABDOMEN AND PELVIS WITHOUT CONTRAST TECHNIQUE: Multidetector CT imaging of the chest, abdomen and pelvis was performed following the standard protocol without IV contrast. Sagittal and coronal MPR images reconstructed from axial data set. No oral contrast administered. COMPARISON:  CT abdomen and pelvis 02/27/2012, no prior CT chest exams for  comparison FINDINGS: CT CHEST FINDINGS Cardiovascular: Atherosclerotic calcifications aorta, proximal great vessels, minimally in coronary arteries. Minimal aneurysmal dilatation ascending thoracic aorta 4.0 cm transverse image 20. No pericardial effusion. Mediastinum/Nodes: Base of cervical region unremarkable. Small amount of air and fluid within esophagus. Mildly enlarged mediastinal lymph nodes 11 mm short axis RIGHT paratracheal image 13, 11 mm short axis precarinal image 17. Lungs/Pleura: Small BILATERAL pleural effusions dependently with adjacent atelectasis of the lower lobes. Consolidation RIGHT upper lobe with additional scattered areas of mild atelectasis in both upper lobes. No pneumothorax. No definite pulmonary mass. Musculoskeletal: Diffuse osseous demineralization. CT ABDOMEN PELVIS FINDINGS Hepatobiliary: Dependent calcified gallstones within gallbladder. Liver unremarkable. Pancreas: Atrophic Spleen: Normal appearance Adrenals/Urinary Tract: Adrenal glands normal appearance. BILATERAL renal cortical atrophy. Nonobstructing RIGHT renal calculus 6 mm diameter. Unremarkable ureters. Decompressed bladder. Stomach/Bowel: Appendix not visualized. GI tract suboptimally assessed due to lack of GI contrast and adequate distention, no gross acute abnormality seen Vascular/Lymphatic: Atherosclerotic calcification aorta without aneurysm. No adenopathy. Reproductive: Uterus surgically absent.  Normal sized ovaries. Other: No free air, free fluid, hernia, or acute inflammatory process. Musculoskeletal: Diffuse osseous demineralization with degenerative changes at both hip joints. IMPRESSION: RIGHT upper lobe infiltrate consistent with pneumonia. Minimally enlarged mediastinal lymph nodes, potentially reactive in the setting of acute infiltrate. Scattered atelectasis and small pleural effusions bilaterally. Cholelithiasis. Renal atrophy with nonobstructing 6 mm RIGHT renal calculus. Aortic atherosclerosis. No  acute intra-abdominal or intrapelvic abnormalities. Electronically Signed   By: Lavonia Dana M.D.   On: 06/10/2016 15:00    Assessment/Plan: Jaidin Ascencion is a 76 y.o. female with RUL PNA and + UA on admission with no UCX.   Flu PCR neg on admit. CT shows RUL PNA. CT abd neg.  Started cefepime and azithro 1/30. On vanco since 1/28. Fevers improving.  Remains with intermittent delirium per her son at her bedside. She has advancing dementia and is bedbound  Recommendations Continue cefepime and azithro Continue vanco Have ordered sputum if patient can produce one.  If culture unable to be done and clinically improving can dc in 1-2 days on oral doxy and levofloxacin for a total 7 day course  Thank you very much for the consult. Will follow with you.  Keshawn Fiorito P   06/11/2016, 1:16 PM

## 2016-06-11 NOTE — Progress Notes (Signed)
Pharmacy Antibiotic Note  Tina [name] is a 76 y.o. female admitted on 06/07/2016 with pneumonia.  Pharmacy has been consulted for cefepime and vancomycin dosing. Pt received one dose of azithromycin and ceftriaxone on 1/28.  ID consulted and saw patient yesterday. Antibiotics broadened from vancomycin only to vancomycin, azithromycin, and cefepime.  Plan: Continue vancomycin 1000 mg IV q24h as ordered. Will adjust if needed based on today's trough @ 1530 Continue cefepime 2 g IV q12h Patient also has z pak ordered  Height: 5\' 5"  (165.1 cm) Weight: 267 lb (121.1 kg) IBW/kg (Calculated) : 57  Temp (24hrs), Avg:99.9 F (37.7 C), Min:98.7 F (37.1 C), Max:100.6 F (38.1 C)   Recent Labs Lab 06/07/16 2307 06/07/16 2308 06/08/16 0000 06/09/16 0431 06/11/16 0343  WBC  --   --  6.3 5.2 2.8*  CREATININE 1.68*  --   --  1.50* 1.33*  LATICACIDVEN  --  2.3*  --   --   --     Estimated Creatinine Clearance: 47.7 mL/min (by C-G formula based on SCr of 1.33 mg/dL (H)).    Allergies  Allergen Reactions  . Morphine And Related    Antimicrobials this admission: cefepime 1/30 >>  vancomycin 1/28 >>  Azithromycin 1/30 >>  Dose adjustments this admission:  Microbiology results: 1/28 BCx: No growth 3 days 1/30 UCx: Sent  1/30 Sputum: Sent 1/28 MRSA PCR: Positive  Thank you for allowing pharmacy to be a part of this patient's care.  Darrow Bussing, PharmD Pharmacy Resident 06/11/2016 8:51 AM

## 2016-06-11 NOTE — Progress Notes (Signed)
[facility] [place] NAME: Tina [name]    MR#:  [id number]  DATE OF BIRTH:  21-Nov-1940  SUBJECTIVE:   No fever overnight.  Remains lethargic but follows simple commands.  No other acute events overnight.   REVIEW OF SYSTEMS:    Review of Systems  Unable to perform ROS: Mental acuity    Nutrition: Dysphagia II Tolerating Diet: Yes Tolerating PT: Await Eval.    DRUG ALLERGIES:   Allergies  Allergen Reactions  . Morphine And Related     VITALS:  Blood pressure (!) 102/49, pulse 98, temperature 98.4 F (36.9 C), temperature source Oral, resp. rate 17, height 5\' 5"  (1.651 m), weight 121.1 kg (267 lb), SpO2 96 %.  PHYSICAL EXAMINATION:   Physical Exam  GENERAL:  76 y.o.-year-old patient lying in bed Lethargic/Encephalopathic.  EYES: Pupils equal, round, reactive to light and accommodation. No scleral icterus. Extraocular muscles intact.  HEENT: Head atraumatic, normocephalic. Oropharynx and nasopharynx clear.  NECK:  Supple, no jugular venous distention. No thyroid enlargement, no tenderness.  LUNGS: Poor Resp. effort, no wheezing, rales, rhonchi. No use of accessory muscles of respiration.  CARDIOVASCULAR: S1, S2 normal. No murmurs, rubs, or gallops.  ABDOMEN: Soft, nontender, nondistended. Bowel sounds present. No organomegaly or mass.  EXTREMITIES: No cyanosis, clubbing or edema b/l.    NEUROLOGIC: Cranial nerves II through XII are intact. No focal Motor or sensory deficits b/l. Globally weak. PSYCHIATRIC: The patient is alert and oriented x 1.  SKIN: No obvious rash, lesion, or ulcer.    LABORATORY PANEL:   CBC  Recent Labs Lab 06/11/16 0343  WBC 2.8*  HGB 10.7*  HCT 31.6*  PLT 123*   ------------------------------------------------------------------------------------------------------------------  Chemistries   Recent Labs Lab 06/07/16 2307  06/11/16 0343  NA 140  < > 141  K 4.3  < > 3.3*  CL 100*   < > 101  CO2 31  < > 32  GLUCOSE 132*  < > 96  BUN 31*  < > 26*  CREATININE 1.68*  < > 1.33*  CALCIUM 7.8*  < > 7.8*  AST 16  --   --   ALT <5*  --   --   ALKPHOS 60  --   --   BILITOT 0.7  --   --   < > = values in this interval not displayed. ------------------------------------------------------------------------------------------------------------------  Cardiac Enzymes  Recent Labs Lab 06/07/16 2307  TROPONINI <0.03   ------------------------------------------------------------------------------------------------------------------  RADIOLOGY:  Ct Abdomen Pelvis Wo Contrast  Result Date: 06/10/2016 CLINICAL DATA:  Weakness, reported fever at nursing facility, shortness of breath, atrial fibrillation, persistent tachypnea, LEFT lower lobe atelectasis versus infiltrate, diabetes mellitus, hypertension, stage III chronic kidney disease EXAM: CT CHEST, ABDOMEN AND PELVIS WITHOUT CONTRAST TECHNIQUE: Multidetector CT imaging of the chest, abdomen and pelvis was performed following the standard protocol without IV contrast. Sagittal and coronal MPR images reconstructed from axial data set. No oral contrast administered. COMPARISON:  CT abdomen and pelvis 02/27/2012, no prior CT chest exams for comparison FINDINGS: CT CHEST FINDINGS Cardiovascular: Atherosclerotic calcifications aorta, proximal great vessels, minimally in coronary arteries. Minimal aneurysmal dilatation ascending thoracic aorta 4.0 cm transverse image 20. No pericardial effusion. Mediastinum/Nodes: Base of cervical region unremarkable. Small amount of air and fluid within esophagus. Mildly enlarged mediastinal lymph nodes 11 mm short axis RIGHT paratracheal image 13, 11 mm short axis precarinal image 17. Lungs/Pleura: Small BILATERAL pleural effusions dependently with adjacent atelectasis of the lower lobes.  Consolidation RIGHT upper lobe with additional scattered areas of mild atelectasis in both upper lobes. No pneumothorax.  No definite pulmonary mass. Musculoskeletal: Diffuse osseous demineralization. CT ABDOMEN PELVIS FINDINGS Hepatobiliary: Dependent calcified gallstones within gallbladder. Liver unremarkable. Pancreas: Atrophic Spleen: Normal appearance Adrenals/Urinary Tract: Adrenal glands normal appearance. BILATERAL renal cortical atrophy. Nonobstructing RIGHT renal calculus 6 mm diameter. Unremarkable ureters. Decompressed bladder. Stomach/Bowel: Appendix not visualized. GI tract suboptimally assessed due to lack of GI contrast and adequate distention, no gross acute abnormality seen Vascular/Lymphatic: Atherosclerotic calcification aorta without aneurysm. No adenopathy. Reproductive: Uterus surgically absent.  Normal sized ovaries. Other: No free air, free fluid, hernia, or acute inflammatory process. Musculoskeletal: Diffuse osseous demineralization with degenerative changes at both hip joints. IMPRESSION: RIGHT upper lobe infiltrate consistent with pneumonia. Minimally enlarged mediastinal lymph nodes, potentially reactive in the setting of acute infiltrate. Scattered atelectasis and small pleural effusions bilaterally. Cholelithiasis. Renal atrophy with nonobstructing 6 mm RIGHT renal calculus. Aortic atherosclerosis. No acute intra-abdominal or intrapelvic abnormalities. Electronically Signed   By: Lavonia Dana M.D.   On: 06/10/2016 15:00   Ct Chest Wo Contrast  Result Date: 06/10/2016 CLINICAL DATA:  Weakness, reported fever at nursing facility, shortness of breath, atrial fibrillation, persistent tachypnea, LEFT lower lobe atelectasis versus infiltrate, diabetes mellitus, hypertension, stage III chronic kidney disease EXAM: CT CHEST, ABDOMEN AND PELVIS WITHOUT CONTRAST TECHNIQUE: Multidetector CT imaging of the chest, abdomen and pelvis was performed following the standard protocol without IV contrast. Sagittal and coronal MPR images reconstructed from axial data set. No oral contrast administered. COMPARISON:  CT  abdomen and pelvis 02/27/2012, no prior CT chest exams for comparison FINDINGS: CT CHEST FINDINGS Cardiovascular: Atherosclerotic calcifications aorta, proximal great vessels, minimally in coronary arteries. Minimal aneurysmal dilatation ascending thoracic aorta 4.0 cm transverse image 20. No pericardial effusion. Mediastinum/Nodes: Base of cervical region unremarkable. Small amount of air and fluid within esophagus. Mildly enlarged mediastinal lymph nodes 11 mm short axis RIGHT paratracheal image 13, 11 mm short axis precarinal image 17. Lungs/Pleura: Small BILATERAL pleural effusions dependently with adjacent atelectasis of the lower lobes. Consolidation RIGHT upper lobe with additional scattered areas of mild atelectasis in both upper lobes. No pneumothorax. No definite pulmonary mass. Musculoskeletal: Diffuse osseous demineralization. CT ABDOMEN PELVIS FINDINGS Hepatobiliary: Dependent calcified gallstones within gallbladder. Liver unremarkable. Pancreas: Atrophic Spleen: Normal appearance Adrenals/Urinary Tract: Adrenal glands normal appearance. BILATERAL renal cortical atrophy. Nonobstructing RIGHT renal calculus 6 mm diameter. Unremarkable ureters. Decompressed bladder. Stomach/Bowel: Appendix not visualized. GI tract suboptimally assessed due to lack of GI contrast and adequate distention, no gross acute abnormality seen Vascular/Lymphatic: Atherosclerotic calcification aorta without aneurysm. No adenopathy. Reproductive: Uterus surgically absent.  Normal sized ovaries. Other: No free air, free fluid, hernia, or acute inflammatory process. Musculoskeletal: Diffuse osseous demineralization with degenerative changes at both hip joints. IMPRESSION: RIGHT upper lobe infiltrate consistent with pneumonia. Minimally enlarged mediastinal lymph nodes, potentially reactive in the setting of acute infiltrate. Scattered atelectasis and small pleural effusions bilaterally. Cholelithiasis. Renal atrophy with  nonobstructing 6 mm RIGHT renal calculus. Aortic atherosclerosis. No acute intra-abdominal or intrapelvic abnormalities. Electronically Signed   By: Lavonia Dana M.D.   On: 06/10/2016 15:00     ASSESSMENT AND PLAN:   76 year old female with past medical history of previous CVA, hypertension, hyperlipidemia, diabetes, depression, chronic kidney disease, atrial fibrillation who presented to the hospital due to weakness and noted to have pneumonia.  1. Pneumonia-suspected to be healthcare associated pneumonia. -RUL pneumonia.  Cont. Cefepime, Vanco, Zithromax.  - appreciate ID input  and if continues to improve and d/c on Oral Doxy, Levaquin for 7 day course.   2. Atrial Fibrillation - rate controlled.  - cont. Cardizem, Metoprolol.  3. DM - cont. Lantus, Novolog SSI.  - BS stable.   4. Neuropathy - cont. Neurontin  5. GERD - cont. Protonix.   6. Hx of COPD - no acute exacerbation.  - cont. Spiriva, PRN duonebs.    All the records are reviewed and case discussed with Care Management/Social Worker. Management plans discussed with the patient, family and they are in agreement.  CODE STATUS: DNR  DVT Prophylaxis: Lovenox  TOTAL TIME TAKING CARE OF THIS PATIENT: 30 minutes.   POSSIBLE D/C IN 2-3 DAYS, DEPENDING ON CLINICAL CONDITION.   Henreitta Leber M.D on 06/11/2016 at 1:42 PM  Between 7am to 6pm - Pager - [phone]  After 6pm go to [email or web address] - Proofreader  [facility]  Office  [phone]  CC: Primary care physician; Pcp Not In System

## 2016-06-11 NOTE — Progress Notes (Signed)
Clinical Education officer, museum (CSW) received consult to provide hospice choice for patient. CSW contacted patient's son Audry Pili and provide hospice choice and he chose [facility]. Plan is for patient to D/C back to [facility], where she is under long term care and start hospice services. CSW left [facility] hospice liaison a voicemail. Doug admissions coordinator at [facility] is aware of above.   [employer], LCSW [phone]

## 2016-06-11 NOTE — Progress Notes (Signed)
Care of pt taken over from [place], [place].  Pt resting comfortably in bed.  Clarise Cruz, RN

## 2016-06-12 LAB — GLUCOSE, CAPILLARY
Glucose-Capillary: 60 mg/dL — ABNORMAL LOW (ref 65–99)
Glucose-Capillary: 85 mg/dL (ref 65–99)
Glucose-Capillary: 92 mg/dL (ref 65–99)
Glucose-Capillary: 73 mg/dL (ref 65–99)
Glucose-Capillary: 63 mg/dL — ABNORMAL LOW (ref 65–99)
Glucose-Capillary: 73 mg/dL (ref 65–99)
Glucose-Capillary: 70 mg/dL (ref 65–99)

## 2016-06-12 MED ORDER — HYDROCODONE-ACETAMINOPHEN 5-325 MG PO TABS
1.0000 | ORAL_TABLET | Freq: Four times a day (QID) | ORAL | Status: DC | PRN
  Administered 2016-06-12: 1 via ORAL
  Filled 2016-06-12: qty 1

## 2016-06-12 MED ORDER — INSULIN GLARGINE 100 UNIT/ML ~~LOC~~ SOLN
19.0000 [IU] | Freq: Every day | SUBCUTANEOUS | Status: DC
  Administered 2016-06-12: 19 [IU] via SUBCUTANEOUS
  Filled 2016-06-12 (×2): qty 0.19

## 2016-06-12 NOTE — Progress Notes (Signed)
Spoke with DO Hugelmeyer about pt lantus dose even with BS low 70s. Per DO Hugelmeyer, still give dose to pt.

## 2016-06-12 NOTE — Progress Notes (Signed)
[facility] at [place] NAME: Tina [name]    MR#:  [id number]  DATE OF BIRTH:  [birth year]  SUBJECTIVE:   Much more awake today and following commands. Has some upper airway rattling.  No other acute events overnight.   REVIEW OF SYSTEMS:    Review of Systems  Unable to perform ROS: Mental acuity    Nutrition: Dysphagia II Tolerating Diet: Yes Tolerating PT: Await Eval.    DRUG ALLERGIES:   Allergies  Allergen Reactions  . Morphine And Related     VITALS:  Blood pressure 118/61, pulse 80, temperature 98.7 F (37.1 C), temperature source Oral, resp. rate 16, height 5\' 5"  (1.651 m), weight 121.1 kg (267 lb), SpO2 94 %.  PHYSICAL EXAMINATION:   Physical Exam  GENERAL:  76 y.o.-year-old patient lying in bed in NAD.  EYES: Pupils equal, round, reactive to light and accommodation. No scleral icterus. Extraocular muscles intact.  HEENT: Head atraumatic, normocephalic. Oropharynx and nasopharynx clear.  NECK:  Supple, no jugular venous distention. No thyroid enlargement, no tenderness.  LUNGS: Poor Resp. Effort, + upper airway rattling,  no wheezing, rales, rhonchi. No use of accessory muscles of respiration.  CARDIOVASCULAR: S1, S2 normal. No murmurs, rubs, or gallops.  ABDOMEN: Soft, nontender, nondistended. Bowel sounds present. No organomegaly or mass.  EXTREMITIES: No cyanosis, clubbing or edema b/l.    NEUROLOGIC: Cranial nerves II through XII are intact. No focal Motor or sensory deficits b/l. Globally weak. PSYCHIATRIC: The patient is alert and oriented x 1.  SKIN: No obvious rash, lesion, or ulcer.    LABORATORY PANEL:   CBC  Recent Labs Lab 06/11/16 0343  WBC 2.8*  HGB 10.7*  HCT 31.6*  PLT 123*   ------------------------------------------------------------------------------------------------------------------  Chemistries   Recent Labs Lab 06/07/16 2307  06/11/16 0343  NA 140  < > 141  K 4.3  < >  3.3*  CL 100*  < > 101  CO2 31  < > 32  GLUCOSE 132*  < > 96  BUN 31*  < > 26*  CREATININE 1.68*  < > 1.33*  CALCIUM 7.8*  < > 7.8*  AST 16  --   --   ALT <5*  --   --   ALKPHOS 60  --   --   BILITOT 0.7  --   --   < > = values in this interval not displayed. ------------------------------------------------------------------------------------------------------------------  Cardiac Enzymes  Recent Labs Lab 06/07/16 2307  TROPONINI <0.03   ------------------------------------------------------------------------------------------------------------------  RADIOLOGY:  Ct Abdomen Pelvis Wo Contrast  Result Date: 06/10/2016 CLINICAL DATA:  Weakness, reported fever at nursing facility, shortness of breath, atrial fibrillation, persistent tachypnea, LEFT lower lobe atelectasis versus infiltrate, diabetes mellitus, hypertension, stage III chronic kidney disease EXAM: CT CHEST, ABDOMEN AND PELVIS WITHOUT CONTRAST TECHNIQUE: Multidetector CT imaging of the chest, abdomen and pelvis was performed following the standard protocol without IV contrast. Sagittal and coronal MPR images reconstructed from axial data set. No oral contrast administered. COMPARISON:  CT abdomen and pelvis 02/27/2012, no prior CT chest exams for comparison FINDINGS: CT CHEST FINDINGS Cardiovascular: Atherosclerotic calcifications aorta, proximal great vessels, minimally in coronary arteries. Minimal aneurysmal dilatation ascending thoracic aorta 4.0 cm transverse image 20. No pericardial effusion. Mediastinum/Nodes: Base of cervical region unremarkable. Small amount of air and fluid within esophagus. Mildly enlarged mediastinal lymph nodes 11 mm short axis RIGHT paratracheal image 13, 11 mm short axis precarinal image 17. Lungs/Pleura: Small BILATERAL pleural effusions dependently  with adjacent atelectasis of the lower lobes. Consolidation RIGHT upper lobe with additional scattered areas of mild atelectasis in both upper lobes. No  pneumothorax. No definite pulmonary mass. Musculoskeletal: Diffuse osseous demineralization. CT ABDOMEN PELVIS FINDINGS Hepatobiliary: Dependent calcified gallstones within gallbladder. Liver unremarkable. Pancreas: Atrophic Spleen: Normal appearance Adrenals/Urinary Tract: Adrenal glands normal appearance. BILATERAL renal cortical atrophy. Nonobstructing RIGHT renal calculus 6 mm diameter. Unremarkable ureters. Decompressed bladder. Stomach/Bowel: Appendix not visualized. GI tract suboptimally assessed due to lack of GI contrast and adequate distention, no gross acute abnormality seen Vascular/Lymphatic: Atherosclerotic calcification aorta without aneurysm. No adenopathy. Reproductive: Uterus surgically absent.  Normal sized ovaries. Other: No free air, free fluid, hernia, or acute inflammatory process. Musculoskeletal: Diffuse osseous demineralization with degenerative changes at both hip joints. IMPRESSION: RIGHT upper lobe infiltrate consistent with pneumonia. Minimally enlarged mediastinal lymph nodes, potentially reactive in the setting of acute infiltrate. Scattered atelectasis and small pleural effusions bilaterally. Cholelithiasis. Renal atrophy with nonobstructing 6 mm RIGHT renal calculus. Aortic atherosclerosis. No acute intra-abdominal or intrapelvic abnormalities. Electronically Signed   By: Lavonia Dana M.D.   On: 06/10/2016 15:00   Ct Chest Wo Contrast  Result Date: 06/10/2016 CLINICAL DATA:  Weakness, reported fever at nursing facility, shortness of breath, atrial fibrillation, persistent tachypnea, LEFT lower lobe atelectasis versus infiltrate, diabetes mellitus, hypertension, stage III chronic kidney disease EXAM: CT CHEST, ABDOMEN AND PELVIS WITHOUT CONTRAST TECHNIQUE: Multidetector CT imaging of the chest, abdomen and pelvis was performed following the standard protocol without IV contrast. Sagittal and coronal MPR images reconstructed from axial data set. No oral contrast administered.  COMPARISON:  CT abdomen and pelvis 02/27/2012, no prior CT chest exams for comparison FINDINGS: CT CHEST FINDINGS Cardiovascular: Atherosclerotic calcifications aorta, proximal great vessels, minimally in coronary arteries. Minimal aneurysmal dilatation ascending thoracic aorta 4.0 cm transverse image 20. No pericardial effusion. Mediastinum/Nodes: Base of cervical region unremarkable. Small amount of air and fluid within esophagus. Mildly enlarged mediastinal lymph nodes 11 mm short axis RIGHT paratracheal image 13, 11 mm short axis precarinal image 17. Lungs/Pleura: Small BILATERAL pleural effusions dependently with adjacent atelectasis of the lower lobes. Consolidation RIGHT upper lobe with additional scattered areas of mild atelectasis in both upper lobes. No pneumothorax. No definite pulmonary mass. Musculoskeletal: Diffuse osseous demineralization. CT ABDOMEN PELVIS FINDINGS Hepatobiliary: Dependent calcified gallstones within gallbladder. Liver unremarkable. Pancreas: Atrophic Spleen: Normal appearance Adrenals/Urinary Tract: Adrenal glands normal appearance. BILATERAL renal cortical atrophy. Nonobstructing RIGHT renal calculus 6 mm diameter. Unremarkable ureters. Decompressed bladder. Stomach/Bowel: Appendix not visualized. GI tract suboptimally assessed due to lack of GI contrast and adequate distention, no gross acute abnormality seen Vascular/Lymphatic: Atherosclerotic calcification aorta without aneurysm. No adenopathy. Reproductive: Uterus surgically absent.  Normal sized ovaries. Other: No free air, free fluid, hernia, or acute inflammatory process. Musculoskeletal: Diffuse osseous demineralization with degenerative changes at both hip joints. IMPRESSION: RIGHT upper lobe infiltrate consistent with pneumonia. Minimally enlarged mediastinal lymph nodes, potentially reactive in the setting of acute infiltrate. Scattered atelectasis and small pleural effusions bilaterally. Cholelithiasis. Renal atrophy  with nonobstructing 6 mm RIGHT renal calculus. Aortic atherosclerosis. No acute intra-abdominal or intrapelvic abnormalities. Electronically Signed   By: Lavonia Dana M.D.   On: 06/10/2016 15:00     ASSESSMENT AND PLAN:   76 year old female with past medical history of previous CVA, hypertension, hyperlipidemia, diabetes, depression, chronic kidney disease, atrial fibrillation who presented to the hospital due to weakness and noted to have pneumonia.  1. Pneumonia-suspected to be healthcare associated pneumonia. -RUL pneumonia.  Cont. Cefepime,  Vanco, Zithromax.  - appreciate ID input and d/c on Oral Doxy, Levaquin for 7 day course likely tomorrow.    2. Atrial Fibrillation - rate controlled.  - cont. Cardizem, Metoprolol.  3. DM - cont. Lantus, Novolog SSI.  - BS stable.   4. Neuropathy - cont. Neurontin  5. GERD - cont. Protonix.   6. Hx of COPD - no acute exacerbation.  - cont. Spiriva, PRN duonebs.   Likely d/c to SNF tomorrow w/ Hospice Services.    All the records are reviewed and case discussed with Care Management/Social Worker. Management plans discussed with the patient, family and they are in agreement.  CODE STATUS: DNR  DVT Prophylaxis: Lovenox  TOTAL TIME TAKING CARE OF THIS PATIENT: 25 minutes.   POSSIBLE D/C IN 1-2 DAYS, DEPENDING ON CLINICAL CONDITION.   Henreitta Leber M.D on 06/12/2016 at 2:08 PM  Between 7am to 6pm - Pager - [phone]  After 6pm go to [email or web address] - Proofreader  [facility]  Office  [phone]  CC: Primary care physician; Pcp Not In System

## 2016-06-12 NOTE — Progress Notes (Signed)
Per MD patient will likely D/C back to [facility] with [facility] services tomorrow. Doug admissions coordinator at [facility] is aware of above.   [employer], LCSW [phone]

## 2016-06-12 NOTE — Progress Notes (Signed)
Inpatient Diabetes Program Recommendations  AACE/ADA: New Consensus Statement on Inpatient Glycemic Control (2015)  Target Ranges:  Prepandial:   less than 140 mg/dL      Peak postprandial:   less than 180 mg/dL (1-2 hours)      Critically ill patients:  140 - 180 mg/dL   Results for [name], [name] (MRN [id number]) as of 06/12/2016 10:24  Ref. Range 06/11/2016 07:35 06/11/2016 12:00 06/11/2016 17:23 06/11/2016 20:26 06/11/2016 22:37 06/12/2016 07:27 06/12/2016 08:01  Glucose-Capillary Latest Ref Range: 65 - 99 mg/dL 77 102 (H) 121 (H) 116 (H) 83 60 (L) 85   Review of Glycemic Control  Diabetes history: DM2 Outpatient Diabetes medications: Lantus 35 units QHS, Humalog 8 units TID with meals Current orders for Inpatient glycemic control: Lantus 22 units QHS, Novolog 0-20 units TID with meals  Inpatient Diabetes Program Recommendations: Insulin - Basal: Fasting glucose 60 mg/dl this morning. Please consider decreasing Lantus to 19 units QHS.  Thanks, Barnie Alderman, RN, MSN, CDE Diabetes Coordinator Inpatient Diabetes Program [phone] (Team Pager from 8am to 5pm)

## 2016-06-12 NOTE — Progress Notes (Signed)
Increased to 4l after neb treatment due to sats of 87% on 3l.

## 2016-06-13 LAB — CULTURE, BLOOD (ROUTINE X 2)
CULTURE: NO GROWTH
Culture: NO GROWTH

## 2016-06-13 LAB — GLUCOSE, CAPILLARY
Glucose-Capillary: 75 mg/dL (ref 65–99)
Glucose-Capillary: 107 mg/dL — ABNORMAL HIGH (ref 65–99)

## 2016-06-13 MED ORDER — DOXYCYCLINE HYCLATE 100 MG PO TABS: 100.0000 mg | ORAL_TABLET | Freq: Two times a day (BID) | ORAL | Status: AC

## 2016-06-13 MED ORDER — METOPROLOL TARTRATE 25 MG PO TABS: 12.5000 mg | ORAL_TABLET | Freq: Two times a day (BID) | ORAL | Status: AC

## 2016-06-13 MED ORDER — ENSURE ENLIVE PO LIQD: 237.0000 mL | Freq: Two times a day (BID) | ORAL | 12 refills | Status: AC

## 2016-06-13 MED ORDER — HYDROCODONE-ACETAMINOPHEN 5-325 MG PO TABS: 1.0000 | ORAL_TABLET | Freq: Every evening | ORAL | 0 refills | Status: AC | PRN

## 2016-06-13 MED ORDER — LEVOFLOXACIN 750 MG PO TABS: 750.0000 mg | ORAL_TABLET | Freq: Every day | ORAL | 0 refills | Status: AC

## 2016-06-13 MED ORDER — INSULIN GLARGINE 100 UNIT/ML ~~LOC~~ SOLN: 19.0000 [IU] | Freq: Every day | SUBCUTANEOUS | 11 refills | Status: AC

## 2016-06-13 MED ORDER — DOXYCYCLINE HYCLATE 100 MG PO TABS
100.0000 mg | ORAL_TABLET | Freq: Two times a day (BID) | ORAL | Status: DC
  Administered 2016-06-13: 100 mg via ORAL
  Filled 2016-06-13 (×2): qty 1

## 2016-06-13 MED ORDER — FUROSEMIDE 20 MG PO TABS: 20.0000 mg | ORAL_TABLET | Freq: Every day | ORAL | Status: AC

## 2016-06-13 MED ORDER — BISACODYL 10 MG RE SUPP: 10.0000 mg | Freq: Once | RECTAL | Status: DC

## 2016-06-13 NOTE — Progress Notes (Signed)
Follow up visit made to new referral for hospice of [facility] at [facility] following discharge. Patient seen sitting up in bed alert, television adjusted at her request. Patient aware of discharge today, reassurance given that her son is aware. Referral notified  And discharge summary faxed. MOST form and portable DNR in place in discharge packet. Thank you. Flo Shanks RN, BSN, [facility] [place], hospital Liaison [phone] c

## 2016-06-13 NOTE — Discharge Summary (Signed)
[facility] at [place] NAME: Tina [name]    MR#:  [id number]  DATE OF BIRTH:  July 26, 1940  DATE OF ADMISSION:  06/07/2016 ADMITTING PHYSICIAN: Harrie Foreman, MD  DATE OF DISCHARGE: 06/13/2016  PRIMARY CARE PHYSICIAN: Pcp Not In System    ADMISSION DIAGNOSIS:  Confusion [R41.0] Dyspnea, unspecified type [R06.00] Community acquired pneumonia of left lower lobe of lung ([place]) [J18.1]  DISCHARGE DIAGNOSIS:  Active Problems:   Pneumonia   Pressure injury of skin   Community acquired pneumonia of left lower lobe of lung ([place])   Lewy body dementia without behavioral disturbance   Palliative care by specialist   Goals of care, counseling/discussion   DNR (do not resuscitate)   SECONDARY DIAGNOSIS:   Past Medical History:  Diagnosis Date  . Atrial fibrillation ([place])   . Atrial flutter ([place])   . Chronic kidney disease    CKD stage 3  . Depression   . Diabetes mellitus   . Fecal incontinence 03/04/2012  . Heart murmur   . Hyperlipidemia   . Hypertension   . Normal exercise sestamibi stress test 2007   [facility]  . Pyelonephritis   . Renal stone   . Stroke [facility]) 06/2011   TIA   . Ureteral stone with hydronephrosis   . Urge incontinence 03/04/2012    HOSPITAL COURSE:   76 year old female with past medical history of previous CVA, hypertension, hyperlipidemia, diabetes, depression, chronic kidney disease, atrial fibrillation who presented to the hospital due to weakness and noted to have pneumonia.  1. Pneumonia-suspected to be healthcare associated pneumonia. -RUL pneumonia.  Treated in the hospital with . Cefepime, Vanco, Zithromax.  - cultures so far have been (-).  Still has some upper airway rattling.  Seen by ID and being discharged on Oral Doxy, Levaquin for 7 more days.   2. Atrial Fibrillation - pt. Remains rate controlled.  - she will cont. Cardizem, Metoprolol.  Not on long term anti-coagulation due to high fall risk  and bedbound.   3. DM - pt. Will cont. Lantus at a lower dose due to Poor PO intake. BS stable upon discharge.   4. Neuropathy - she will cont. Neurontin  5. GERD - she will resume her Omeprazole.   6. Hx of COPD - no acute exacerbation.   - she will cont. Spiriva, PRN duonebs.   D/C to SNF w/ Hospice Services today.     DISCHARGE CONDITIONS:   Stable.   CONSULTS OBTAINED:  Treatment Team:  Leonel Ramsay, MD  DRUG ALLERGIES:   Allergies  Allergen Reactions  . Morphine And Related     DISCHARGE MEDICATIONS:   Allergies as of 06/13/2016      Reactions   Morphine And Related       Medication List    STOP taking these medications   cholestyramine 4 g packet Commonly known as:  QUESTRAN   diclofenac sodium 1 % Gel Commonly known as:  VOLTAREN   hyoscyamine 0.125 MG SL tablet Commonly known as:  LEVSIN SL   insulin lispro 100 UNIT/ML injection Commonly known as:  HUMALOG   meclizine 12.5 MG tablet Commonly known as:  ANTIVERT   Rivaroxaban 15 MG Tabs tablet Commonly known as:  XARELTO     TAKE these medications   bisacodyl 10 MG suppository Commonly known as:  DULCOLAX Place 1 suppository (10 mg total) rectally daily as needed.   carbidopa-levodopa 25-100 MG tablet Commonly known as:  SINEMET  IR Take 1 tablet by mouth 4 (four) times daily.   cyanocobalamin 1000 MCG/ML injection Commonly known as:  (VITAMIN B-12) Inject 1,000 mcg into the muscle every 30 (thirty) days.   diltiazem 360 MG 24 hr capsule Commonly known as:  CARDIZEM CD Take 1 capsule (360 mg total) by mouth daily.   divalproex 125 MG capsule Commonly known as:  DEPAKOTE SPRINKLE Take 125 mg by mouth 2 (two) times daily. 125mg  am and 250mg  at bedtime   doxycycline 100 MG tablet Commonly known as:  VIBRA-TABS Take 1 tablet (100 mg total) by mouth every 12 (twelve) hours.   feeding supplement (ENSURE ENLIVE) Liqd Take 237 mLs by mouth 2 (two) times daily between meals.    fluticasone 50 MCG/ACT nasal spray Commonly known as:  FLONASE Place 2 sprays into both nostrils daily.   furosemide 20 MG tablet Commonly known as:  LASIX Take 1 tablet (20 mg total) by mouth daily. What changed:  medication strength  how much to take   gabapentin 100 MG capsule Commonly known as:  NEURONTIN Take 100 mg by mouth 3 (three) times daily.   HYDROcodone-acetaminophen 5-325 MG tablet Commonly known as:  NORCO/VICODIN Take 1 tablet by mouth at bedtime as needed for moderate pain.   insulin glargine 100 UNIT/ML injection Commonly known as:  LANTUS Inject 0.19 mLs (19 Units total) into the skin at bedtime. What changed:  how much to take   ipratropium-albuterol 0.5-2.5 (3) MG/3ML Soln Commonly known as:  DUONEB Take 3 mLs by nebulization every 4 (four) hours as needed (wheezing).   levofloxacin 750 MG tablet Commonly known as:  LEVAQUIN Take 1 tablet (750 mg total) by mouth daily.   magnesium oxide 400 MG tablet Commonly known as:  MAG-OX Take 400 mg by mouth 2 (two) times daily.   metoprolol tartrate 25 MG tablet Commonly known as:  LOPRESSOR Take 0.5 tablets (12.5 mg total) by mouth 2 (two) times daily.   multivitamin with minerals Tabs tablet Take 1 tablet by mouth daily.   omeprazole 20 MG capsule Commonly known as:  PRILOSEC Take 20 mg by mouth 2 (two) times daily before a meal.   pregabalin 25 MG capsule Commonly known as:  LYRICA Take 25 mg by mouth every evening.   senna-docusate 8.6-50 MG tablet Commonly known as:  Senokot-S Take 2 tablets by mouth 2 (two) times daily.   tiotropium 18 MCG inhalation capsule Commonly known as:  SPIRIVA Place 18 mcg into inhaler and inhale daily.         DISCHARGE INSTRUCTIONS:   DIET:  Cardiac diet and Diabetic diet  Dysphagia II with thin liquids and strict aspiration precautions.   DISCHARGE CONDITION:  Stable  ACTIVITY:  Activity as tolerated  OXYGEN:  Home Oxygen: Yes.     Oxygen  Delivery: 2 liters/min via Patient connected to nasal cannula oxygen  DISCHARGE LOCATION:  nursing home   If you experience worsening of your admission symptoms, develop shortness of breath, life threatening emergency, suicidal or homicidal thoughts you must seek medical attention immediately by calling 911 or calling your MD immediately  if symptoms less severe.  You Must read complete instructions/literature along with all the possible adverse reactions/side effects for all the Medicines you take and that have been prescribed to you. Take any new Medicines after you have completely understood and accpet all the possible adverse reactions/side effects.   Please note  You were cared for by a hospitalist during your hospital stay. If you  have any questions about your discharge medications or the care you received while you were in the hospital after you are discharged, you can call the unit and asked to speak with the hospitalist on call if the hospitalist that took care of you is not available. Once you are discharged, your primary care physician will handle any further medical issues. Please note that NO REFILLS for any discharge medications will be authorized once you are discharged, as it is imperative that you return to your primary care physician (or establish a relationship with a primary care physician if you do not have one) for your aftercare needs so that they can reassess your need for medications and monitor your lab values.     Today   Still has some upper airway rattling.  Mental status much improved and following commands.  No other acute events overnight.   VITAL SIGNS:  Blood pressure (!) 115/56, pulse 76, temperature 98.6 F (37 C), temperature source Oral, resp. rate 17, height 5\' 5"  (1.651 m), weight 121.1 kg (267 lb), SpO2 93 %.  I/O:  No intake or output data in the 24 hours ending 06/13/16 0902  PHYSICAL EXAMINATION:   GENERAL:  76 y.o.-year-old patient lying in  bed in NAD.  EYES: Pupils equal, round, reactive to light and accommodation. No scleral icterus. Extraocular muscles intact.  HEENT: Head atraumatic, normocephalic. Oropharynx and nasopharynx clear.  NECK:  Supple, no jugular venous distention. No thyroid enlargement, no tenderness.  LUNGS: Poor Resp. Effort, + upper airway rattling,  no wheezing, rales, rhonchi. No use of accessory muscles of respiration.  CARDIOVASCULAR: S1, S2 normal. No murmurs, rubs, or gallops.  ABDOMEN: Soft, nontender, nondistended. Bowel sounds present. No organomegaly or mass.  EXTREMITIES: No cyanosis, clubbing or edema b/l.    NEUROLOGIC: Cranial nerves II through XII are intact. No focal Motor or sensory deficits b/l. Globally weak. PSYCHIATRIC: The patient is alert and oriented x 1.  SKIN: No obvious rash, lesion, or ulcer.    DATA REVIEW:   CBC  Recent Labs Lab 06/11/16 0343  WBC 2.8*  HGB 10.7*  HCT 31.6*  PLT 123*    Chemistries   Recent Labs Lab 06/07/16 2307  06/11/16 0343  NA 140  < > 141  K 4.3  < > 3.3*  CL 100*  < > 101  CO2 31  < > 32  GLUCOSE 132*  < > 96  BUN 31*  < > 26*  CREATININE 1.68*  < > 1.33*  CALCIUM 7.8*  < > 7.8*  AST 16  --   --   ALT <5*  --   --   ALKPHOS 60  --   --   BILITOT 0.7  --   --   < > = values in this interval not displayed.  Cardiac Enzymes  Recent Labs Lab 06/07/16 2307  TROPONINI <0.03    Microbiology Results  Results for orders placed or performed during the hospital encounter of 06/07/16  Blood Culture (routine x 2)     Status: None   Collection Time: 06/07/16 11:08 PM  Result Value Ref Range Status   Specimen Description BLOOD LEFT HAND  Final   Special Requests BOTTLES DRAWN AEROBIC AND ANAEROBIC 2CCAERO,2CCANA  Final   Culture NO GROWTH 5 DAYS  Final   Report Status 06/13/2016 FINAL  Final  Blood Culture (routine x 2)     Status: None   Collection Time: 06/08/16 12:00 AM  Result Value Ref Range  Status   Specimen Description  BLOOD RIGHT HAND  Final   Special Requests BOTTLES DRAWN AEROBIC AND ANAEROBIC 4CCAERO,3CCANA  Final   Culture NO GROWTH 5 DAYS  Final   Report Status 06/13/2016 FINAL  Final  MRSA PCR Screening     Status: Abnormal   Collection Time: 06/08/16  4:31 AM  Result Value Ref Range Status   MRSA by PCR POSITIVE (A) NEGATIVE Final    Comment:        The GeneXpert MRSA Assay (FDA approved for NASAL specimens only), is one component of a comprehensive MRSA colonization surveillance program. It is not intended to diagnose MRSA infection nor to guide or monitor treatment for MRSA infections. RESULT CALLED TO, READ BACK BY AND VERIFIED WITH: PHYLLIS KING ON 06/08/16 AT 0612 BY [facility]     RADIOLOGY:  No results found.    Management plans discussed with the patient, family and they are in agreement.  CODE STATUS:     Code Status Orders        Start     Ordered   06/08/16 0419  Do not attempt resuscitation (DNR)  Continuous    Question Answer Comment  In the event of cardiac or respiratory ARREST Do not call a "code blue"   In the event of cardiac or respiratory ARREST Do not perform Intubation, CPR, defibrillation or ACLS   In the event of cardiac or respiratory ARREST Use medication by any route, position, wound care, and other measures to relive pain and suffering. May use oxygen, suction and manual treatment of airway obstruction as needed for comfort.      06/08/16 0418    Code Status History    Date Active Date Inactive Code Status Order ID Comments User Context   06/08/2016  3:20 AM 06/08/2016  3:20 AM Full Code [id number]  Harrie Foreman, MD ED   02/27/2012  9:23 PM 03/10/2012  9:43 PM Full Code [id number]  Mignon Pine, RN Inpatient    Advance Directive Documentation   Flowsheet Row Most Recent Value  Type of Advance Directive  Healthcare Power of Attorney  Pre-existing out of facility DNR order (yellow form or pink MOST form)  No data  "MOST" Form in Place?  No  data      TOTAL TIME TAKING CARE OF THIS PATIENT: 40 minutes.    Henreitta Leber M.D on 06/13/2016 at 9:02 AM  Between 7am to 6pm - Pager - [phone]  After 6pm go to [email or web address] - Proofreader  [facility]  Office  [phone]  CC: Primary care physician; Pcp Not In System

## 2016-06-13 NOTE — Progress Notes (Signed)
New referral for [facility] at [facility] following discharge received from [place]. Patient is a 76 year old woman with a past medical history of dementia, CVA, HTN, HLD, DM, depression, CKD III, afib/flutter, COPD, and diastolic CHF admitted on 06/20/1978 with weakness and shortness of breath. In the ED a  chest xray revealed left lower lobe atelectasis versus infiltrate-antibiotics initiated for healthcare associated pneumonia. She has been followed by infectious disease-on cefepime and azithromycin. Afib controlled with cardizem and metoprolol. At baseline she is bed bound with oxygen via nasal cannula at 2 liters, currently on 3 liters. Patient seen lying in bed, alert, able to converse and answer questions. She continues with a loose nonproductive cough, only ate bites at breakfast and must be fed. Writer met in the family room with patient's son Annett Gula to initiate education regarding hospice services, philosophy and team approach to care with good understanding voiced, patient has received hospice services at [facility] care previously and is a current Palliative patient there.. Plan is for discharge tomorrow 2/2. Patient will need EMS transport.Hospital care team all aware.  Patient information faxed to hospice referral. Will continue to follow through final disposition. Thank you. Flo Shanks RN, BSN, [place] and [facility] [place], [facility] [phone] c

## 2016-06-13 NOTE — Care Management Important Message (Signed)
Important Message  Patient Details  Name: Tina [name] MRN: [id number] Date of Birth: [birth year]   Medicare Important Message Given:  Yes    Shelbie Ammons, RN 06/13/2016, 11:05 AM

## 2016-06-13 NOTE — Discharge Instructions (Signed)
Aspiration Pneumonia Aspiration pneumonia is an infection in your lungs. It occurs when food, liquid, or stomach contents (vomit) are inhaled (aspirated) into your lungs. When these things get into your lungs, swelling (inflammation) and infection can occur. This can make it difficult for you to breathe. Aspiration pneumonia is a serious condition and can be life threatening. What increases the risk? Aspiration pneumonia is more likely to occur when a person's cough (gag) reflex or ability to swallow has been decreased. Some things that can do this include:  Having a brain injury or disease, such as stroke, seizures, Parkinson's disease, dementia, or amyotrophic lateral sclerosis (ALS).  Being given general anesthetic for procedures.  Being in a coma (unconscious).  Having a narrowing of the tube that carries food to the stomach (esophagus).  Drinking too much alcohol. If a person passes out and vomits, vomit can be swallowed into the lungs.  Taking certain medicines, such as tranquilizers or sedatives. What are the signs or symptoms?  Coughing after swallowing food or liquids.  Breathing problems, such as wheezing or shortness of breath.  Bluish skin. This can be caused by lack of oxygen.  Coughing up food or mucus. The mucus might contain blood, greenish material, or yellowish-white fluid (pus).  Fever.  Chest pain.  Being more tired than usual (fatigue).  Sweating more than usual.  Bad breath. How is this diagnosed? A physical exam will be done. During the exam, the health care provider will listen to your lungs with a stethoscope to check for:  Crackling sounds in the lungs.  Decreased breath sounds.  A rapid heartbeat. Various tests may be ordered. These may include:  Chest X-ray.  CT scan.  Swallowing study. This test looks at how food is swallowed and whether it goes into your breathing tube (trachea) or food pipe (esophagus).  Sputum culture. Saliva and  mucus (sputum) are collected from the lungs or the tubes that carry air to the lungs (bronchi). The sputum is then tested for bacteria.  Bronchoscopy. This test uses a flexible tube (bronchoscope) to see inside the lungs. How is this treated? Treatment will usually include antibiotic medicines. Other medicines may also be used to reduce fever or pain. You may need to be treated in the hospital. In the hospital, your breathing will be carefully monitored. Depending on how well you are breathing, you may need to be given oxygen, or you may need breathing support from a breathing machine (ventilator). For people who fail a swallowing study, a feeding tube might be placed in the stomach, or they may be asked to avoid certain food textures or liquids when they eat. Follow these instructions at home:  Carefully follow any special eating instructions you were given, such as avoiding certain food textures or thickening liquids. This reduces the risk of developing aspiration pneumonia again.  Only take over-the-counter or prescription medicines as directed by your health care provider. Follow the directions carefully.  If you were prescribed antibiotics, take them as directed. Finish them even if you start to feel better.  Rest as instructed by your health care provider.  Keep all follow-up appointments with your health care provider. Contact a health care provider if:  You develop worsening shortness of breath, wheezing, or difficulty breathing.  You develop a fever.  You have chest pain. This information is not intended to replace advice given to you by your health care provider. Make sure you discuss any questions you have with your health care provider. Document Released:  02/23/2009 Document Revised: 10/10/2015 Document Reviewed: 10/14/2012 Elsevier Interactive Patient Education  2017 [place] Pneumonia Healthcare-associated pneumonia is a lung infection that a  person can get when in a health care setting or during certain procedures. The infection causes air sacs inside the lungs to fill with pus or fluid. Healthcare-associated pneumonia is usually caused by bacteria that are common in health care settings. These bacteria may be resistant to some antibiotic medicines. What are the causes? This condition is caused by bacteria that get into your lungs. You can get this condition if you:  Breathe in droplets from an infected person's cough or sneeze.  Touch something that an infected person coughed or sneezed on and then touch your mouth, nose, or eyes.  Have a bacterial infection somewhere else in your body, if the bacteria spread to your lungs through your blood. What increases the risk? This condition is more likely to develop in people who:  Have a disease that weakens their body's defense system (immune system) or their ability to cough out germs.  Are older than age 58.  Having trouble swallowing.  Use a feeding or breathing tube.  Have a cold or the flu.  Have an IV tube inserted in a vein.  Have surgery.  Have a bed sore.  Live in a long-term care facility, such as a nursing home.  Were in the hospital for two or more days in the past 3 months.  Received hemodialysis in the past 30 days. What are the signs or symptoms? Symptoms of this condition include:  Fever.  Chills.  Cough.  Shortness of breath.  Wheezing or crackling sounds when breathing. How is this diagnosed? This condition may be diagnosed based on:  Your symptoms.  A chest X-ray.  A measurement of the amount of oxygen in your blood. How is this treated? This condition is treated with antibiotics. Your health care provider may take a sample of cells (culture) from your throat to determine what type of bacteria is in your lungs and change your antibiotic based on the results. If you have bacteria in your blood, trouble breathing, or a low oxygen level,  you may need to be treated at the hospital. At the hospital, you will be given antibiotics through an IV tube. You may also be given oxygen or breathing treatments. Follow these instructions at home: Medicine  Take your antibiotic medicine as told by your health care provider. Do not stop taking the antibiotic even if you start to feel better.  Take over-the-counter and other prescription medicines only as told by your health care provider. Activity  Rest at home until you feel better.  Return to your normal activities as told by your health care provider. Ask your health care provider what activities are safe for you. General instructions  Drink enough fluid to keep your urine clear or pale yellow.  Do not use any products that contain nicotine or tobacco, such as cigarettes and e-cigarettes. If you need help quitting, ask your health care provider.  Limit alcohol intake to no more than 1 drink per day for nonpregnant women and 2 drinks per day for men. One drink equals 12 oz of beer, 5 oz of wine, or 1 oz of hard liquor.  Keep all follow-up visits as told by your health care provider. This is important. How is this prevented? Actions that I can take To lower your risk of getting this condition again:  Do not smoke. This includes  e-cigarettes.  Do not drink too much alcohol.  Keep your immune system healthy by eating well and getting enough sleep.  Get a flu shot every year (annually).  Get a pneumonia vaccination if:  You are older than age 56.  You smoke.  You have a long-lasting condition like lung disease.  Exercise your lungs by taking deep breaths, walking, and using an incentive spirometer as directed.  Wash your hands often with soap and water. If you cannot get to a sink to wash your hands, use an alcohol-based hand cleaner.  Make sure your health care providers are washing their hands. If you do not see them wash their hands, ask them to do so.  When you are  in a health care facility, avoid touching your eyes, nose, and mouth.  Avoid touching any surface near where people have coughed or sneezed.  Stand away from sick people when they are coughing or sneezing.  Wear a mask if you cannot avoid exposure to people who are sick.  Clean all surfaces often with a disinfectant cleaner, especially if someone is sick at home or work. Precautions of my health care team Hospitals, nursing homes, and other health care facilities take special care to try to prevent healthcare-associated pneumonia. To do this, your health care team may:  Clean their hands with soap and water or with alcohol-based hand sanitizer before and after seeing patients.  Wear gloves or masks during treatment.  Sanitize medical instruments, tubes, other equipment, and surfaces in patient rooms.  Raise (elevate) the head of your hospital bed so you are not lying flat. The head of the bed may be elevated 30 degrees or more.  Have you sit up and move around as soon as possible after surgery.  Only insert a breathing tube if needed.  Do these things for you if you have a breathing tube:  Clean the inside of your mouth regularly.  Remove the breathing tube as soon as it is no longer needed. Contact a health care provider if:  Your symptoms do not get better or they get worse.  Your symptoms come back after you have finished taking your antibiotics. Get help right away if:  You have trouble breathing.  You have confusion or difficulty thinking. This information is not intended to replace advice given to you by your health care provider. Make sure you discuss any questions you have with your health care provider. Document Released: 09/18/2015 Document Revised: 02/12/2016 Document Reviewed: 01/25/2016 Elsevier Interactive Patient Education  2017 [place].   Aspiration Pneumonia Aspiration pneumonia is an infection in your lungs. It occurs when food, liquid, or stomach  contents (vomit) are inhaled (aspirated) into your lungs. When these things get into your lungs, swelling (inflammation) and infection can occur. This can make it difficult for you to breathe. Aspiration pneumonia is a serious condition and can be life threatening. What increases the risk? Aspiration pneumonia is more likely to occur when a person's cough (gag) reflex or ability to swallow has been decreased. Some things that can do this include:  Having a brain injury or disease, such as stroke, seizures, Parkinson's disease, dementia, or amyotrophic lateral sclerosis (ALS).  Being given general anesthetic for procedures.  Being in a coma (unconscious).  Having a narrowing of the tube that carries food to the stomach (esophagus).  Drinking too much alcohol. If a person passes out and vomits, vomit can be swallowed into the lungs.  Taking certain medicines, such as tranquilizers or  sedatives. What are the signs or symptoms?  Coughing after swallowing food or liquids.  Breathing problems, such as wheezing or shortness of breath.  Bluish skin. This can be caused by lack of oxygen.  Coughing up food or mucus. The mucus might contain blood, greenish material, or yellowish-white fluid (pus).  Fever.  Chest pain.  Being more tired than usual (fatigue).  Sweating more than usual.  Bad breath. How is this diagnosed? A physical exam will be done. During the exam, the health care provider will listen to your lungs with a stethoscope to check for:  Crackling sounds in the lungs.  Decreased breath sounds.  A rapid heartbeat. Various tests may be ordered. These may include:  Chest X-ray.  CT scan.  Swallowing study. This test looks at how food is swallowed and whether it goes into your breathing tube (trachea) or food pipe (esophagus).  Sputum culture. Saliva and mucus (sputum) are collected from the lungs or the tubes that carry air to the lungs (bronchi). The sputum is then  tested for bacteria.  Bronchoscopy. This test uses a flexible tube (bronchoscope) to see inside the lungs. How is this treated? Treatment will usually include antibiotic medicines. Other medicines may also be used to reduce fever or pain. You may need to be treated in the hospital. In the hospital, your breathing will be carefully monitored. Depending on how well you are breathing, you may need to be given oxygen, or you may need breathing support from a breathing machine (ventilator). For people who fail a swallowing study, a feeding tube might be placed in the stomach, or they may be asked to avoid certain food textures or liquids when they eat. Follow these instructions at home:  Carefully follow any special eating instructions you were given, such as avoiding certain food textures or thickening liquids. This reduces the risk of developing aspiration pneumonia again.  Only take over-the-counter or prescription medicines as directed by your health care provider. Follow the directions carefully.  If you were prescribed antibiotics, take them as directed. Finish them even if you start to feel better.  Rest as instructed by your health care provider.  Keep all follow-up appointments with your health care provider. Contact a health care provider if:  You develop worsening shortness of breath, wheezing, or difficulty breathing.  You develop a fever.  You have chest pain. This information is not intended to replace advice given to you by your health care provider. Make sure you discuss any questions you have with your health care provider. Document Released: 02/23/2009 Document Revised: 10/10/2015 Document Reviewed: 10/14/2012 Elsevier Interactive Patient Education  2017 [employer].

## 2016-06-13 NOTE — Progress Notes (Signed)
Pharmacy Antibiotic Note  Tina [name] is a 76 y.o. female admitted on 06/07/2016 with pneumonia.  Pharmacy has been consulted for cefepime and vancomycin dosing. Pt received one dose of azithromycin and ceftriaxone on 1/28.  ID consulted and saw patient yesterday. Antibiotics broadened from vancomycin only to vancomycin, azithromycin, and cefepime.  Plan: Continue Vancomycin 750 mg IV q24 hours.    Continue cefepime 2 g IV q12h Patient also has z pak ordered  Per MD Sainani's note  d/c on Oral Doxy, Levaquin for 7 day course likely today.     Height: 5\' 5"  (165.1 cm) Weight: 267 lb (121.1 kg) IBW/kg (Calculated) : 57  Temp (24hrs), Avg:98.6 F (37 C), Min:98.3 F (36.8 C), Max:98.9 F (37.2 C)   Recent Labs Lab 06/07/16 2307 06/07/16 2308 06/08/16 0000 06/09/16 0431 06/11/16 0343 06/11/16 1548  WBC  --   --  6.3 5.2 2.8*  --   CREATININE 1.68*  --   --  1.50* 1.33*  --   LATICACIDVEN  --  2.3*  --   --   --   --   VANCOTROUGH  --   --   --   --   --  23*    Estimated Creatinine Clearance: 47.7 mL/min (by C-G formula based on SCr of 1.33 mg/dL (H)).    Allergies  Allergen Reactions  . Morphine And Related    Antimicrobials this admission: cefepime 1/30 >>  vancomycin 1/28 >>  Azithromycin 1/30 >>  Dose adjustments this admission:  Microbiology results: 1/28 BCx: No growth 3 days 1/30 UCx: Sent  1/30 Sputum: Sent 1/28 MRSA PCR: Positive  Thank you for allowing pharmacy to be a part of this patient's care.  Larene Beach, PharmD 06/13/2016 7:58 AM

## 2016-06-13 NOTE — Progress Notes (Signed)
MD making rounds. Order received to discharge to facility. SW facilitating transfer to facility. Report called to [facility]. Transported via EMS. Belongings sent home with family.

## 2016-06-13 NOTE — Progress Notes (Signed)
Inpatient Diabetes Program Recommendations  AACE/ADA: New Consensus Statement on Inpatient Glycemic Control (2015)  Target Ranges:  Prepandial:   less than 140 mg/dL      Peak postprandial:   less than 180 mg/dL (1-2 hours)      Critically ill patients:  140 - 180 mg/dL   Lab Results  Component Value Date   GLUCAP 75 06/13/2016   HGBA1C 6.3 (H) 06/07/2016    Review of Glycemic Control  Results for NILAJA, HOXSIE (MRN [id number]) as of 06/13/2016 10:32  Ref. Range 06/12/2016 17:06 06/12/2016 20:42 06/12/2016 21:01 06/12/2016 23:11 06/13/2016 07:39  Glucose-Capillary Latest Ref Range: 65 - 99 mg/dL 73 63 (L) 73 70 75    Diabetes history: DM2 Outpatient Diabetes medications: Lantus 35 units QHS, Humalog 8 units TID with meals Current orders for Inpatient glycemic control: Lantus 19 units QHS, Novolog 0-20 units TID with meals  Inpatient Diabetes Program Recommendations: Patient discharge orders written- d/c home on Lantus 19 units qhs- text paged MD consider decrease Lantus to 15 units qhs  Gentry Fitz, RN, [place], [place], CDE Diabetes Coordinator Inpatient Diabetes Program  [phone] (Team Pager) [phone] ([place]) 06/13/2016 10:34 AM

## 2016-06-13 NOTE — Progress Notes (Signed)
Patient is medically stable for D/C back to [facility] with [facility] services. Per [facility] admissions coordinator at [facility] patient will go to room 76-B. RN will call report and arrange EMS for transport. Clinical Education officer, museum (CSW) sent D/C orders to [facility] via [employer]. Patient is aware of above. CSW contacted patient's son Audry Pili and made him aware of above. [facility] hospice liaison is aware of above. Please reconsult if future social work needs arise. CSW signing off.   [employer], LCSW [phone]

## 2016-10-10 DEATH — deceased
